# Patient Record
Sex: Male | Born: 1945 | Race: Black or African American | Hispanic: No | Marital: Married | Smoking: Former smoker
Health system: Southern US, Community
[De-identification: ages and names within clinical notes are randomized; demographics above are authoritative.]

## PROBLEM LIST (undated history)

## (undated) DIAGNOSIS — M5136 Other intervertebral disc degeneration, lumbar region: Secondary | ICD-10-CM

## (undated) DIAGNOSIS — M5030 Other cervical disc degeneration, unspecified cervical region: Secondary | ICD-10-CM

## (undated) DIAGNOSIS — I2510 Atherosclerotic heart disease of native coronary artery without angina pectoris: Secondary | ICD-10-CM

## (undated) DIAGNOSIS — Z87442 Personal history of urinary calculi: Secondary | ICD-10-CM

## (undated) DIAGNOSIS — M51369 Other intervertebral disc degeneration, lumbar region without mention of lumbar back pain or lower extremity pain: Secondary | ICD-10-CM

## (undated) DIAGNOSIS — I10 Essential (primary) hypertension: Secondary | ICD-10-CM

## (undated) DIAGNOSIS — F32A Depression, unspecified: Secondary | ICD-10-CM

## (undated) DIAGNOSIS — I639 Cerebral infarction, unspecified: Secondary | ICD-10-CM

## (undated) DIAGNOSIS — G4730 Sleep apnea, unspecified: Secondary | ICD-10-CM

## (undated) DIAGNOSIS — N529 Male erectile dysfunction, unspecified: Secondary | ICD-10-CM

## (undated) DIAGNOSIS — E785 Hyperlipidemia, unspecified: Secondary | ICD-10-CM

## (undated) DIAGNOSIS — I219 Acute myocardial infarction, unspecified: Secondary | ICD-10-CM

## (undated) DIAGNOSIS — N189 Chronic kidney disease, unspecified: Secondary | ICD-10-CM

## (undated) HISTORY — PX: OTHER SURGICAL HISTORY: SHX169

---

## 2002-08-15 ENCOUNTER — Ambulatory Visit (HOSPITAL_COMMUNITY): Admission: RE | Admit: 2002-08-15 | Discharge: 2002-08-15 | Payer: Self-pay | Admitting: Gastroenterology

## 2002-08-15 ENCOUNTER — Encounter (INDEPENDENT_AMBULATORY_CARE_PROVIDER_SITE_OTHER): Payer: Self-pay | Admitting: Specialist

## 2002-09-08 ENCOUNTER — Encounter: Admission: RE | Admit: 2002-09-08 | Discharge: 2002-09-08 | Payer: Self-pay | Admitting: Family Medicine

## 2002-09-08 ENCOUNTER — Encounter: Payer: Self-pay | Admitting: Family Medicine

## 2003-11-11 ENCOUNTER — Encounter (INDEPENDENT_AMBULATORY_CARE_PROVIDER_SITE_OTHER): Payer: Self-pay | Admitting: Specialist

## 2003-11-11 ENCOUNTER — Observation Stay (HOSPITAL_COMMUNITY): Admission: EM | Admit: 2003-11-11 | Discharge: 2003-11-12 | Payer: Self-pay | Admitting: Emergency Medicine

## 2003-11-11 ENCOUNTER — Encounter: Payer: Self-pay | Admitting: Family Medicine

## 2006-10-24 ENCOUNTER — Encounter: Admission: RE | Admit: 2006-10-24 | Discharge: 2006-10-24 | Payer: Self-pay | Admitting: Family Medicine

## 2008-03-17 ENCOUNTER — Encounter: Admission: RE | Admit: 2008-03-17 | Discharge: 2008-03-17 | Payer: Self-pay | Admitting: Family Medicine

## 2008-06-10 ENCOUNTER — Encounter: Admission: RE | Admit: 2008-06-10 | Discharge: 2008-06-10 | Payer: Self-pay | Admitting: Family Medicine

## 2011-01-13 NOTE — Op Note (Signed)
   NAME:  Daniel Cox, Daniel Cox                        ACCOUNT NO.:  [id number]   MEDICAL RECORD NO.:  [id number]                   PATIENT TYPE:  AMB   LOCATION:  ENDO                                 FACILITY:  [facility]   PHYSICIAN:  Danise Edge, M.D.                DATE OF BIRTH:  04-May-1946   DATE OF PROCEDURE:  08/15/2002  DATE OF DISCHARGE:  08/15/2002                                 OPERATIVE REPORT   PROCEDURE:  Colonoscopy and polypectomy.   INDICATIONS:  The patient is a 65 year old male born 08/06/1946.  The patient is  scheduled to undergo his first screening colonoscopy with polypectomy to  prevent colon cancer.   ENDOSCOPIST:  Danise Edge, M.D.   PREMEDICATION:  Versed 5 mg, Demerol 50 mg.   ENDOSCOPE:  Olympus pediatric colonoscope.   DESCRIPTION OF PROCEDURE:  After obtaining informed consent, the patient was  placed in the left lateral decubitus position.  I administered intravenous  Demerol and intravenous Versed to achieve conscious sedation for the  procedure.  The patient's, blood pressure, oxygen saturation, and cardiac  rhythm were monitored throughout the procedure and documented in the medical  record.   Anal inspection was normal.  Digital rectal examination was normal.  The  Olympus pediatric video colonoscope was introduced into the rectum and  advanced to the cecum.  Colonic preparation for the exam today was  excellent.   Rectum normal.   Sigmoid colon and descending colon:  At approximately 50 cm from the anal  verge a 0.5 mm sessile polyp was removed with the cold snare; pathology was  not obtained.   Splenic flexure normal.   Transverse colon:  From the proximal transverse colon a 2 mm sessile polyp  was removed with the electrocautery snare and submitted for pathologic  interpretation.   Hepatic flexure normal.   Ascending colon normal.   Cecum and ileocecal valve normal.    ASSESSMENT:  From the proximal transverse colon a 2 mm sessile  polyp was  removed and submitted for pathologic interpretation.  From the descending  colon a 0.5 mm sessile polyp was removed with the cold snare but no  pathology was obtained.   RECOMMENDATIONS:  Repeat colonoscopy in five years.                                               Danise Edge, M.D.    MJ/MEDQ  D:  08/15/2002  T:  08/17/2002  Job:  [id number]   cc:   Donia Guiles, M.D.  [address] [place]  [place] [address]  Fax: [phone]

## 2011-01-13 NOTE — H&P (Signed)
NAME:  Daniel Cox, Daniel Cox                        ACCOUNT NO.:  [id number]   MEDICAL RECORD NO.:  [id number]                   PATIENT TYPE:  OBV   LOCATION:  0098                                 FACILITY:  [facility]   PHYSICIAN:  Ollen Gross. Vernell Morgans, M.D.              DATE OF BIRTH:  16-Mar-1946   DATE OF ADMISSION:  11/11/2003  DATE OF DISCHARGE:                                HISTORY & PHYSICAL   REASON FOR ADMISSION:  Daniel Cox is a 65 year old black male who presented  to his medical doctor today with a 4-day history of right lower quadrant  pain.  He has also had some nausea associated with this but no vomiting.  He  is not running any fevers at home.  He has not had any diarrhea.  The pain  never went away and seemed to worsen today and so he went to his medical  doctor.  He otherwise denies fevers, chills, chest pain, shortness of  breath, diarrhea, dysuria.  His other review of systems is unremarkable.   PAST MEDICAL HISTORY:  None.   PAST SURGICAL HISTORY:  None.   MEDICATIONS:  None.   ALLERGIES:  None.   SOCIAL HISTORY:  He smokes about 1/2 pack of cigarettes per day and denies  any alcohol use.   FAMILY HISTORY:  Significant for breast cancer in a sister and colon cancer  in a sister.   On review of his white count from the outside facility, his white count was  10,200 which was normal.  BUN and creatinine were normal.  On review of the  CT scan from [facility] done a couple of hours ago, it is consistent  with acute appendicitis.   ASSESSMENT AND PLAN:  This is a 65 year old black male who appears to have  acute appendicitis.  I have recommended he consider having his appendix  removed tonight in the operating room and he would also like to have this  done.  I have explained to him in detail the risks and benefits of the  operation as well as some of the technical aspects and he understands and  wishes to proceed.  We will complete his preoperative workup with  some labs,  chest x-ray, and EKG and if all of these check out we will plan for surgery  this evening.                                               Ollen Gross. Vernell Morgans, M.D.    PST/MEDQ  D:  11/11/2003  T:  11/12/2003  Job:  [id number]

## 2011-01-13 NOTE — Op Note (Signed)
NAMEJADORE, Daniel Cox                          ACCOUNT NO.:  [id number]   MEDICAL RECORD NO.:  [id number]                    PATIENT TYPE:   LOCATION:                                       FACILITY:   PHYSICIAN:  Ollen Gross. Vernell Morgans, M.D.              DATE OF BIRTH:   DATE OF PROCEDURE:  11/11/2003  DATE OF DISCHARGE:                                 OPERATIVE REPORT   PREOPERATIVE DIAGNOSIS:  Appendicitis.   POSTOPERATIVE DIAGNOSIS:  Appendicitis.   PROCEDURE:  Laparoscopic appendectomy.   SURGEON:  Ollen Gross. Carolynne Edouard, M.D.   ANESTHESIA:  General endotracheal.   DESCRIPTION OF PROCEDURE:  After informed consent was obtained the patient  was brought to the operating room and placed in the supine position on the  operating table.  After adequate induction of general anesthesia the  patient's abdomen was prepped with Betadine and draped in the usual sterile  manner.  The area below the umbilicus was infiltrated with 1/4% Marcaine.  A  small incision was made with a 15-blade knife.  This incision was carried  down through the subcutaneous tissue bluntly with the Kelly clamp and  __________ needle retractors until the linea alba was identified. The linea  alba was incised with a 15-blade knife and each side was grasped with Kocher  clamps and elevated anteriorly.   The preperitoneal space was then probed bluntly with the hemostat until the  peritoneum was opened and access was gained into the abdominal cavity.  A #0  Vicryl pursestring stitch was placed in the fascia surrounding the opening.  A Hasson cannula was placed through the opening and __________ a #0 Vicryl  pursestring stitch.  The abdomen was then insufflated with carbon dioxide  without difficulty.  The patient was placed in a Trendelenburg position and  rotated with the right side up.   The right lower quadrant was inspected.  An enlarged and inflamed appendix  was identified.  At this point an area in the epigastrium was  infiltrated  with 1/4% Marcaine and a small incision was made with the 15-blade knife and  a 5-mm port was placed bluntly through this incision into the abdominal  cavity under direct vision.   A site was then also chosen in the suprapubic region for placement of a 12-  mm port.  This area was infiltrated with 1/4% Marcaine.  A small incision  was made with the 15-blade knife and the 12-mm port was placed bluntly  through this incision into the abdominal cavity under direct vision.  A  combination of blunt dissection in the right lower quadrant and sharp  dissection with the harmonic scalpel was used to mobilize the appendix from  the retroperitoneum.  Some adhesions of the right colon were taken down  sharply with laparoscopic scissors.  This allowed good visualization of the  appendix. One the appendix had been mobilized from its retroperitoneal  attachment the mesoappendix was taken down sharply with the harmonic scalpel  to the base of the appendix at its junction with the cecum.  An endoscopic  GIA-stapler was then placed across the base of the appendix, clamped and  fired; thereby, dividing the base of the appendix between staple lines.  An  endoscopic bag was placed within the abdomen and the appendix was placed  within the bag and the bag was sealed.   The appendiceal stump was then inspected and was found to be hemostatic and  the staples appeared to be intact and nicely placed.  The abdomen was then  irrigated with copious amounts of saline.  The rest of the abdomen was  inspected and no other abnormalities were identified.  The bag with the  appendix was then removed through the infraumbilical port with the Hasson  cannula without difficulty.   The fascial defect was closed with the previously placed Vicryl pursestring  stitch as well as with another interrupted #0 Vicryl stitch.  The rest of  the ports were removed under direct vision and were found to be hemostatic,  and  the gas was allowed to escape.  The anterior fascia of the suprapubic  port was closed with an interrupted #0 Vicryl stitch.  Each of the skin  incisions were closed with interrupted 4-0 Monocryl subcuticular stitches.  Benzoin and Steri-Strips were then applied.  The patient tolerated the  procedure well.  At the end of the case all needle, sponge, and instrument  counts were correct.  The patient was then awakened and taken to recovery  room in stable condition.                                               Ollen Gross. Vernell Morgans, M.D.    PST/MEDQ  D:  11/14/2003  T:  11/14/2003  Job:  [id number]

## 2013-02-04 ENCOUNTER — Emergency Department (HOSPITAL_COMMUNITY): Payer: Medicare Other

## 2013-02-04 ENCOUNTER — Inpatient Hospital Stay (HOSPITAL_COMMUNITY)
Admission: EM | Admit: 2013-02-04 | Discharge: 2013-02-11 | DRG: 248 | Disposition: A | Discharge status: Home / Self Care | Payer: Medicare Other | Attending: Interventional Cardiology | Admitting: Interventional Cardiology

## 2013-02-04 ENCOUNTER — Encounter (HOSPITAL_COMMUNITY): Payer: Self-pay | Admitting: Emergency Medicine

## 2013-02-04 DIAGNOSIS — I249 Acute ischemic heart disease, unspecified: Secondary | ICD-10-CM

## 2013-02-04 DIAGNOSIS — E7800 Pure hypercholesterolemia, unspecified: Secondary | ICD-10-CM | POA: Diagnosis present

## 2013-02-04 DIAGNOSIS — G8929 Other chronic pain: Secondary | ICD-10-CM | POA: Diagnosis present

## 2013-02-04 DIAGNOSIS — I2510 Atherosclerotic heart disease of native coronary artery without angina pectoris: Principal | ICD-10-CM

## 2013-02-04 DIAGNOSIS — M51379 Other intervertebral disc degeneration, lumbosacral region without mention of lumbar back pain or lower extremity pain: Secondary | ICD-10-CM | POA: Diagnosis present

## 2013-02-04 DIAGNOSIS — Z79899 Other long term (current) drug therapy: Secondary | ICD-10-CM

## 2013-02-04 DIAGNOSIS — N529 Male erectile dysfunction, unspecified: Secondary | ICD-10-CM | POA: Diagnosis not present

## 2013-02-04 DIAGNOSIS — IMO0002 Reserved for concepts with insufficient information to code with codable children: Secondary | ICD-10-CM | POA: Diagnosis not present

## 2013-02-04 DIAGNOSIS — I200 Unstable angina: Secondary | ICD-10-CM | POA: Diagnosis present

## 2013-02-04 DIAGNOSIS — E785 Hyperlipidemia, unspecified: Secondary | ICD-10-CM

## 2013-02-04 DIAGNOSIS — I669 Occlusion and stenosis of unspecified cerebral artery: Secondary | ICD-10-CM

## 2013-02-04 DIAGNOSIS — Y840 Cardiac catheterization as the cause of abnormal reaction of the patient, or of later complication, without mention of misadventure at the time of the procedure: Secondary | ICD-10-CM | POA: Diagnosis not present

## 2013-02-04 DIAGNOSIS — R4701 Aphasia: Secondary | ICD-10-CM | POA: Diagnosis not present

## 2013-02-04 DIAGNOSIS — H5340 Unspecified visual field defects: Secondary | ICD-10-CM | POA: Diagnosis not present

## 2013-02-04 DIAGNOSIS — G8190 Hemiplegia, unspecified affecting unspecified side: Secondary | ICD-10-CM | POA: Diagnosis not present

## 2013-02-04 DIAGNOSIS — Z8249 Family history of ischemic heart disease and other diseases of the circulatory system: Secondary | ICD-10-CM

## 2013-02-04 DIAGNOSIS — N183 Chronic kidney disease, stage 3 unspecified: Secondary | ICD-10-CM

## 2013-02-04 DIAGNOSIS — F17200 Nicotine dependence, unspecified, uncomplicated: Secondary | ICD-10-CM | POA: Diagnosis present

## 2013-02-04 DIAGNOSIS — I6340 Cerebral infarction due to embolism of unspecified cerebral artery: Secondary | ICD-10-CM | POA: Diagnosis not present

## 2013-02-04 DIAGNOSIS — N400 Enlarged prostate without lower urinary tract symptoms: Secondary | ICD-10-CM | POA: Diagnosis not present

## 2013-02-04 DIAGNOSIS — R29810 Facial weakness: Secondary | ICD-10-CM | POA: Diagnosis not present

## 2013-02-04 DIAGNOSIS — M5030 Other cervical disc degeneration, unspecified cervical region: Secondary | ICD-10-CM | POA: Diagnosis present

## 2013-02-04 DIAGNOSIS — Z955 Presence of coronary angioplasty implant and graft: Secondary | ICD-10-CM

## 2013-02-04 DIAGNOSIS — I498 Other specified cardiac arrhythmias: Secondary | ICD-10-CM | POA: Diagnosis present

## 2013-02-04 DIAGNOSIS — M5137 Other intervertebral disc degeneration, lumbosacral region: Secondary | ICD-10-CM | POA: Diagnosis present

## 2013-02-04 HISTORY — DX: Male erectile dysfunction, unspecified: N52.9

## 2013-02-04 HISTORY — DX: Other intervertebral disc degeneration, lumbar region without mention of lumbar back pain or lower extremity pain: M51.369

## 2013-02-04 HISTORY — DX: Other cervical disc degeneration, unspecified cervical region: M50.30

## 2013-02-04 HISTORY — DX: Hyperlipidemia, unspecified: E78.5

## 2013-02-04 HISTORY — DX: Chronic kidney disease, unspecified: N18.9

## 2013-02-04 HISTORY — DX: Other intervertebral disc degeneration, lumbar region: M51.36

## 2013-02-04 LAB — COMPREHENSIVE METABOLIC PANEL
ALT: 17 U/L (ref 0–53)
AST: 25 U/L (ref 0–37)
Alkaline Phosphatase: 87 U/L (ref 39–117)
CO2: 26 mEq/L (ref 19–32)
Calcium: 8.9 mg/dL (ref 8.4–10.5)
Potassium: 3.8 mEq/L (ref 3.5–5.1)
Sodium: 138 mEq/L (ref 135–145)
Total Protein: 6.7 g/dL (ref 6.0–8.3)

## 2013-02-04 LAB — CBC
HCT: 41.1 % (ref 39.0–52.0)
MCHC: 34.3 g/dL (ref 30.0–36.0)
MCV: 96 fL (ref 78.0–100.0)
RDW: 12.4 % (ref 11.5–15.5)

## 2013-02-04 LAB — BASIC METABOLIC PANEL
BUN: 17 mg/dL (ref 6–23)
Chloride: 105 mEq/L (ref 96–112)
Creatinine, Ser: 1.43 mg/dL — ABNORMAL HIGH (ref 0.50–1.35)
GFR calc Af Amer: 58 mL/min — ABNORMAL LOW (ref >90)
GFR calc non Af Amer: 50 mL/min — ABNORMAL LOW (ref >90)

## 2013-02-04 LAB — TROPONIN I: Troponin I: 0.69 ng/mL (ref <0.30)

## 2013-02-04 MED ORDER — NITROGLYCERIN IN D5W 200-5 MCG/ML-% IV SOLN
2.0000 ug/min | Freq: Once | INTRAVENOUS | Status: AC
  Administered 2013-02-04: 5 ug/min via INTRAVENOUS
  Filled 2013-02-04: qty 250

## 2013-02-04 MED ORDER — ADULT MULTIVITAMIN W/MINERALS CH
1.0000 | ORAL_TABLET | Freq: Every day | ORAL | Status: DC
  Administered 2013-02-04 – 2013-02-09 (×5): 1 via ORAL
  Filled 2013-02-04 (×7): qty 1

## 2013-02-04 MED ORDER — ASPIRIN 300 MG RE SUPP
300.0000 mg | RECTAL | Status: AC
  Filled 2013-02-04: qty 1

## 2013-02-04 MED ORDER — CYCLOBENZAPRINE HCL 10 MG PO TABS: 10.0000 mg | ORAL_TABLET | Freq: Every evening | ORAL | Status: DC | PRN

## 2013-02-04 MED ORDER — HEPARIN SODIUM (PORCINE) 5000 UNIT/ML IJ SOLN
4000.0000 [IU] | Freq: Once | INTRAMUSCULAR | Status: AC
  Administered 2013-02-04: 4000 [IU] via INTRAVENOUS

## 2013-02-04 MED ORDER — SODIUM CHLORIDE 0.9 % IV SOLN
INTRAVENOUS | Status: DC
  Administered 2013-02-04: 75 mL/h via INTRAVENOUS

## 2013-02-04 MED ORDER — NITROGLYCERIN 0.4 MG SL SUBL: 0.4000 mg | SUBLINGUAL_TABLET | SUBLINGUAL | Status: DC | PRN

## 2013-02-04 MED ORDER — ONDANSETRON HCL 4 MG/2ML IJ SOLN: 4.0000 mg | Freq: Four times a day (QID) | INTRAMUSCULAR | Status: DC | PRN

## 2013-02-04 MED ORDER — ASPIRIN EC 81 MG PO TBEC
81.0000 mg | DELAYED_RELEASE_TABLET | Freq: Every day | ORAL | Status: DC
  Administered 2013-02-05 – 2013-02-09 (×4): 81 mg via ORAL
  Filled 2013-02-04 (×5): qty 1

## 2013-02-04 MED ORDER — METHOCARBAMOL 500 MG PO TABS
750.0000 mg | ORAL_TABLET | Freq: Two times a day (BID) | ORAL | Status: DC | PRN
  Filled 2013-02-04: qty 1

## 2013-02-04 MED ORDER — ASPIRIN 81 MG PO CHEW: 324.0000 mg | CHEWABLE_TABLET | ORAL | Status: AC

## 2013-02-04 MED ORDER — HYDROCODONE-ACETAMINOPHEN 5-325 MG PO TABS: 1.0000 | ORAL_TABLET | Freq: Four times a day (QID) | ORAL | Status: DC | PRN

## 2013-02-04 MED ORDER — ATORVASTATIN CALCIUM 40 MG PO TABS
40.0000 mg | ORAL_TABLET | Freq: Every day | ORAL | Status: DC
  Administered 2013-02-05 – 2013-02-10 (×4): 40 mg via ORAL
  Filled 2013-02-04 (×7): qty 1

## 2013-02-04 MED ORDER — ACETAMINOPHEN 325 MG PO TABS: 650.0000 mg | ORAL_TABLET | ORAL | Status: DC | PRN

## 2013-02-04 MED ORDER — HEPARIN (PORCINE) IN NACL 100-0.45 UNIT/ML-% IJ SOLN
950.0000 [IU]/h | INTRAMUSCULAR | Status: DC
  Administered 2013-02-04 – 2013-02-05 (×2): 950 [IU]/h via INTRAVENOUS
  Filled 2013-02-04 (×3): qty 250

## 2013-02-04 MED ORDER — ZOLPIDEM TARTRATE 5 MG PO TABS: 5.0000 mg | ORAL_TABLET | Freq: Every evening | ORAL | Status: DC | PRN

## 2013-02-04 NOTE — ED Notes (Signed)
[place] IV placed by Dr. Silverio Lay, IV noted to have a pulsation when hooked to IV fluid. IV removed and restarted by this RN.

## 2013-02-04 NOTE — ED Notes (Signed)
Pt c/o midsternal CP x 5 days and pain in back across shoulder x 2 days; pt sent here for further eval

## 2013-02-04 NOTE — ED Provider Notes (Signed)
History     CSN: [id number]  Arrival date & time 02/04/13  1433   None     Chief Complaint  Patient presents with  . Chest Pain  . Shoulder Pain    (Consider location/radiation/quality/duration/timing/severity/associated sxs/prior treatment) Patient is a 67 y.o. male presenting with chest pain and shoulder pain.  Chest Pain Pain location:  Substernal area Pain quality: aching   Radiates to: bil shoulders. Pain radiates to the back: yes   Pain severity:  Moderate Onset quality:  Gradual Duration:  6 days Timing:  Constant Progression:  Worsening Chronicity:  New Context: not breathing and no movement   Relieved by: muscle relaxant. Worsened by:  Certain positions Ineffective treatments:  None tried Associated symptoms: cough and nausea   Associated symptoms: no abdominal pain, no back pain, no dizziness, no fever, no headache, no PND, no shortness of breath, not vomiting and no weakness   Shoulder Pain Associated symptoms include chest pain, coughing and nausea. Pertinent negatives include no abdominal pain, arthralgias, chills, congestion, fever, headaches, rash, sore throat, vomiting or weakness.    Past Medical History  Diagnosis Date  . Chronic kidney disease   . Hyperlipidemia   . Erectile dysfunction   . Degenerative disc disease, cervical   . Degenerative lumbar disc     Past Surgical History  Procedure Laterality Date  . Right shoulder    . Appendectony      History reviewed. No pertinent family history.  History  Substance Use Topics  . Smoking status: Current Every Day Smoker -- 0.50 packs/day for 30 years  . Smokeless tobacco: Not on file  . Alcohol Use: No      Review of Systems  Constitutional: Negative for fever and chills.  HENT: Negative for congestion, sore throat and rhinorrhea.   Eyes: Negative for photophobia and visual disturbance.  Respiratory: Positive for cough. Negative for shortness of breath.   Cardiovascular: Positive for  chest pain. Negative for leg swelling and PND.  Gastrointestinal: Positive for nausea. Negative for vomiting, abdominal pain, diarrhea and constipation.  Endocrine: Negative for polydipsia and polyuria.  Genitourinary: Negative for dysuria and hematuria.  Musculoskeletal: Negative for back pain and arthralgias.  Skin: Negative for color change and rash.  Neurological: Negative for dizziness, syncope, weakness, light-headedness and headaches.  Hematological: Negative for adenopathy. Does not bruise/bleed easily.  All other systems reviewed and are negative.    Allergies  Penicillins  Home Medications  No current outpatient prescriptions on file.  BP 118/65  Pulse 69  Temp(Src) 98.5 F (36.9 C) (Oral)  Resp 19  Ht 5\' 8"  (1.727 m)  Wt 169 lb 9.6 oz (76.93 kg)  BMI 25.79 kg/m2  SpO2 95%  Physical Exam  Vitals reviewed. Constitutional: He is oriented to person, place, and time. He appears well-developed and well-nourished.  HENT:  Head: Normocephalic and atraumatic.  Eyes: Conjunctivae and EOM are normal.  Neck: Normal range of motion. Neck supple.  Cardiovascular: Normal rate, regular rhythm and normal heart sounds.   Pulmonary/Chest: Effort normal and breath sounds normal. No respiratory distress.  Abdominal: He exhibits no distension. There is no tenderness. There is no rebound and no guarding.  Musculoskeletal: Normal range of motion.  Neurological: He is alert and oriented to person, place, and time.  Skin: Skin is warm and dry.    ED Course  Procedures (including critical care time)  Labs Reviewed  BASIC METABOLIC PANEL - Abnormal; Notable for the following:    Creatinine, Ser 1.43 (*)  GFR calc non Af Amer 50 (*)    GFR calc Af Amer 58 (*)    All other components within normal limits  TROPONIN I - Abnormal; Notable for the following:    Troponin I 0.69 (*)    All other components within normal limits  TROPONIN I - Abnormal; Notable for the following:     Troponin I 0.56 (*)    All other components within normal limits  TROPONIN I - Abnormal; Notable for the following:    Troponin I 0.33 (*)    All other components within normal limits  COMPREHENSIVE METABOLIC PANEL - Abnormal; Notable for the following:    Glucose, Bld 174 (*)    GFR calc non Af Amer 56 (*)    GFR calc Af Amer 64 (*)    All other components within normal limits  PRO B NATRIURETIC PEPTIDE - Abnormal; Notable for the following:    Pro B Natriuretic peptide (BNP) 344.1 (*)    All other components within normal limits  LIPID PANEL - Abnormal; Notable for the following:    HDL 35 (*)    All other components within normal limits  BASIC METABOLIC PANEL - Abnormal; Notable for the following:    Glucose, Bld 102 (*)    Creatinine, Ser 1.51 (*)    GFR calc non Af Amer 47 (*)    GFR calc Af Amer 54 (*)    All other components within normal limits  POCT I-STAT TROPONIN I - Abnormal; Notable for the following:    Troponin i, poc 0.27 (*)    All other components within normal limits  CBC  TSH  HEMOGLOBIN A1C  HEPARIN LEVEL (UNFRACTIONATED)  HEPARIN LEVEL (UNFRACTIONATED)   Dg Chest 2 View  02/04/2013   *RADIOLOGY REPORT*  Clinical Data: Chest and shoulder pain.  Smoker.  CHEST - 2 VIEW  Comparison: 10/24/2006  Findings: Borderline cardiomegaly is stable, as well as ectasia of the thoracic aorta.  Both lungs are clear.  No evidence of pleural effusion.  No mass or lymphadenopathy identified.  IMPRESSION: Stable exam.  No active disease.   Original Report Authenticated By: Myles Rosenthal, M.D.     1. Acute coronary syndrome   2. Chronic kidney disease (CKD), stage III (moderate)   3. Hyperlipidemia      Date: 02/04/2013  Rate: 62  Rhythm: normal sinus rhythm  QRS Axis: normal  Intervals: normal  ST/T Wave abnormalities: t wave inversions in lateral leads  Conduction Disutrbances:none  Narrative Interpretation:   Old EKG Reviewed: none available    MDM  67 y.o. male   with pertinent PMH of hypercholesterolemia presents with chest pain x 5 days, mix of typical and atypical features including intermittent exertional nature, nausea, radiation to bilateral shoulders, however pt also has chronic neck and back pain and pain initially began in neck and began to radiate to shoulders and arms.  Pt seen by PCP, found to have t wave inversion in anterior leads, sent here for further eval.  Physical exam benign.  Initial trop .27, cardiology consulted.    Pt admitted without change in condition or incident.   Labs and imaging as above reviewed by myself and attending,Dr. Rhunette Croft, with whom case was discussed.   1. Acute coronary syndrome   2. Chronic kidney disease (CKD), stage III (moderate)   3. Hyperlipidemia             Noel Gerold, MD 02/05/13 1443  I performed a history and  physical examination of  [name] and discussed his management with Dr. Littie Deeds. I agree with the history, physical, assessment, and plan of care, with the following exceptions: None I was present for the following procedures: None  Time Spent in Critical Care of the patient: None  Time spent in discussions with the patient and family: 5 minutes  Chest pain, with troponin elevation - Cards aware and admitting. ASA provided. Lareta Bruneau   Derwood Kaplan, MD 02/05/13 [phone]

## 2013-02-04 NOTE — H&P (Addendum)
Daniel Cox is a 67 y.o. male  Admit date: 02/04/2013 Referring Physician: Cam Hai, M.D. Primary Cardiologist:: HWB Leia Alf, M.D. Chief complaint / reason for admission: Acute coronary syndrome  HPI: 67 year old gentleman with few medical problems other than chronic renal insufficiency possibly related to acetaminophen and nonsteroidal inflammatory overuse is admitted with a 7-10 day history of recurring left precordial chest discomfort. Episodes of been occurring spontaneously. Yesterday he had a severe episode of discomfort that lasted greater than 40 minutes after he took some trash bags to the dumpster. When he awakened this morning no discomfort was present but he went to [place] at [place], which he was felt to be having an acute coronary syndrome and was sent to the emergency room. He is pain free. The initial troponin is elevated. His creatinine is also elevated at 1.5.    PMH:    Past Medical History  Diagnosis Date  . Chronic kidney disease   . Hyperlipidemia   . Erectile dysfunction   . Degenerative disc disease, cervical   . Degenerative lumbar disc     PSH:    Past Surgical History  Procedure Laterality Date  . Right shoulder    . Appendectony      ALLERGIES:   Penicillins  Prior to Admit Meds:   (Not in a hospital admission) Family HX:  Mother, father, 2 brothers, and a sister either died of MI or have diagnosis of CAD pipe 95  Social HX:    History   Social History  . Marital Status: Married    Spouse Name: N/A    Number of Children: 3  . Years of Education: N/A   Occupational History  . Not on file.   Social History Main Topics  . Smoking status: Current Every Day Smoker -- 0.50 packs/day for 30 years  . Smokeless tobacco: Not on file  . Alcohol Use: No  . Drug Use: No  . Sexually Active: Yes   Other Topics Concern  . Not on file   Social History Narrative  . No narrative on file     ROS: Chronic neck and shoulder discomfort  degenerative disc disease. 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 Hyperlipidemia chronic pain related to lumbar and cervical spine degeneration. No history of CAD. No pulmonary problems, diabetes, or tobacco use.  Physical Exam: Blood pressure 134/81, pulse 67, temperature 98.4 F (36.9 C), temperature source Oral, resp. rate 18, height 5\' 8"  (1.727 m), weight 76.658 kg (169 lb), SpO2 98.00%.    African American male who is in no distress. He is lying comfortably on the hospital gurney.  HEENT exam is unremarkable. No Johns is noted.  Neck exam reveals no JVD or carotid bruits.  Chest is clear to auscultation and percussion.  S4 gallop is heard on auscultation. No murmur or rub is heard.  Abdomen is somewhat protuberant but nontender. Bowel sounds are normal. No bruits are heard.  Extremities reveal no edema. Radial pulses are 2+ bilaterally. Femoral pulses are 2+ bilaterally.  Neurological exam is unremarkable. No motor sensory deficits and noted.  Labs:   Lab Results  Component Value Date   WBC 6.1 02/04/2013   HGB 14.1 02/04/2013   HCT 41.1 02/04/2013   MCV 96.0 02/04/2013   PLT 205 02/04/2013     Recent Labs Lab 02/04/13 1447  NA 141  K 4.1  CL 105  CO2 27  BUN 17  CREATININE  1.43*  CALCIUM 9.5  GLUCOSE 85   No results found for this basename: CKTOTAL,  CKMB,  CKMBINDEX,  TROPONINI   Troponin (Point of Care Test)  Recent Labs  02/04/13 1503  TROPIPOC 0.27*     Radiology:   *RADIOLOGY REPORT*  Clinical Data: Chest and shoulder pain. Smoker.  CHEST - 2 VIEW  Comparison: 10/24/2006  Findings: Borderline cardiomegaly is stable, as well as ectasia of  the thoracic aorta. Both lungs are clear. No evidence of pleural  effusion. No mass or lymphadenopathy identified.  IMPRESSION:  Stable exam. No active disease.  Original Report Authenticated By: Myles Rosenthal, M.D.   EKG:   Normal sinus rhythm with  precordial T-wave inversion V2 through V4   ASSESSMENT:   1. Acute coronary syndrome  2. Chronic kidney disease, stage III  3. Hyperlipidemia  Plan:  1. IV hydration  2. Serial cardiac markers  3. Probable coronary angiography in the next 24-48 hours depending upon renal function and schedule.  4. Aspirin, IV nitroglycerin, IV heparin, beta blocker therapy. Lesleigh Noe 02/04/2013 5:49 PM

## 2013-02-04 NOTE — Progress Notes (Signed)
ANTICOAGULATION CONSULT NOTE - Initial Consult  Pharmacy Consult for Heparin Indication: chest pain/ACS  Allergies  Allergen Reactions  . Penicillins Hives, Itching and Rash    Patient Measurements: Height: 5\' 8"  (172.7 cm) Weight: 169 lb (76.658 kg) IBW/kg (Calculated) : 68.4 Heparin Dosing Weight: 76.7 kg  Vital Signs: Temp: 98.4 F (36.9 C) (06/10 1437) Temp src: Oral (06/10 1437) BP: 143/85 mmHg (06/10 1521) Pulse Rate: 54 (06/10 1521)  Labs:  Recent Labs  02/04/13 1447  HGB 14.1  HCT 41.1  PLT 205  CREATININE 1.43*    Estimated Creatinine Clearance: 49.8 ml/min (by C-G formula based on Cr of 1.43).   Medical History:  Hypercholesterolemia  Chronic neck and back pain  Assessment:  67 yr old man, seen by PCP today, sent to [facility] for evaluation of chest pain x 5 days, some radiation to shoulders noted.    Goal of Therapy:  Heparin level 0.3-0.7 units/ml Monitor platelets by anticoagulation protocol: Yes   Plan:   Heparin 4000 units IV bolus,  Heparin drip to begin at 950 units/hr.  Heparin level ~ 6 hrs after drip begins.  Daily heparin level and CBC while on heparin.  Dennie Fetters, RPh Pager: [phone] 02/04/2013,3:57 PM

## 2013-02-04 NOTE — Progress Notes (Signed)
CRITICAL VALUE ALERT  Critical value received:  Troponin 0.69  Date of notification: 02/04/13  Time of notification:  21:00  Critical value read back: yes   Nurse who received alert: Otilio Connors  MD notified (1st page):  Dr. Armanda Magic- on call for Dr. Katrinka Blazing   Time of first page:  21:00 MD notified (2nd page):  Time of second page: Time MD responded:  21:05  Responding MD:  Dr. Armanda Magic   No new orders received-advised her pt is pain free, vitals are stable, and he is already on a Heparin and Ntg gtt.

## 2013-02-05 LAB — LIPID PANEL
Cholesterol: 119 mg/dL (ref 0–200)
HDL: 35 mg/dL — ABNORMAL LOW
LDL Cholesterol: 57 mg/dL (ref 0–99)
Total CHOL/HDL Ratio: 3.4 ratio
Triglycerides: 133 mg/dL
VLDL: 27 mg/dL (ref 0–40)

## 2013-02-05 LAB — HEMOGLOBIN A1C: Mean Plasma Glucose: 114 mg/dL (ref <117)

## 2013-02-05 LAB — BASIC METABOLIC PANEL WITH GFR
BUN: 21 mg/dL (ref 6–23)
CO2: 28 meq/L (ref 19–32)
Calcium: 8.8 mg/dL (ref 8.4–10.5)
Chloride: 104 meq/L (ref 96–112)
Creatinine, Ser: 1.51 mg/dL — ABNORMAL HIGH (ref 0.50–1.35)
GFR calc Af Amer: 54 mL/min — ABNORMAL LOW
GFR calc non Af Amer: 47 mL/min — ABNORMAL LOW
Glucose, Bld: 102 mg/dL — ABNORMAL HIGH (ref 70–99)
Potassium: 3.7 meq/L (ref 3.5–5.1)
Sodium: 140 meq/L (ref 135–145)

## 2013-02-05 LAB — TROPONIN I
Troponin I: 0.56 ng/mL
Troponin I: 0.33 ng/mL

## 2013-02-05 LAB — HEPARIN LEVEL (UNFRACTIONATED): Heparin Unfractionated: 0.44 IU/mL (ref 0.30–0.70)

## 2013-02-05 LAB — TSH: TSH: 0.924 u[IU]/mL (ref 0.350–4.500)

## 2013-02-05 MED ORDER — SODIUM CHLORIDE 0.9 % IV SOLN
INTRAVENOUS | Status: DC
  Administered 2013-02-05: 10:00:00 via INTRAVENOUS

## 2013-02-05 MED ORDER — SODIUM CHLORIDE 0.9 % IV SOLN
INTRAVENOUS | Status: DC
  Administered 2013-02-05 – 2013-02-06 (×3): via INTRAVENOUS

## 2013-02-05 MED ORDER — DIAZEPAM 5 MG PO TABS
5.0000 mg | ORAL_TABLET | ORAL | Status: AC
  Administered 2013-02-06: 5 mg via ORAL
  Filled 2013-02-05: qty 1

## 2013-02-05 MED ORDER — SODIUM CHLORIDE 0.9 % IV SOLN: 250.0000 mL | INTRAVENOUS | Status: DC | PRN

## 2013-02-05 MED ORDER — SODIUM CHLORIDE 0.9 % IJ SOLN: 3.0000 mL | INTRAMUSCULAR | Status: DC | PRN

## 2013-02-05 MED ORDER — ASPIRIN 81 MG PO CHEW
324.0000 mg | CHEWABLE_TABLET | ORAL | Status: AC
  Administered 2013-02-06: 324 mg via ORAL
  Filled 2013-02-05: qty 4

## 2013-02-05 MED ORDER — SODIUM CHLORIDE 0.9 % IJ SOLN
3.0000 mL | Freq: Two times a day (BID) | INTRAMUSCULAR | Status: DC
  Administered 2013-02-05: 3 mL via INTRAVENOUS

## 2013-02-05 NOTE — Progress Notes (Signed)
ANTICOAGULATION CONSULT NOTE - Follow Up Consult  Pharmacy Consult for heparin Indication: chest pain/ACS  Labs:  Recent Labs  02/04/13 1447 02/04/13 1955 02/04/13 2016 02/05/13 0025 02/05/13 0030  HGB 14.1  --   --   --   --   HCT 41.1  --   --   --   --   PLT 205  --   --   --   --   HEPARINUNFRC  --   --   --  0.44  --   CREATININE 1.43* 1.31  --   --  1.51*  TROPONINI  --   --  0.69*  --  0.56*    Assessment/Plan:  67yo male therapeutic on heparin with initial dosing for CP.  Will continue gtt at current rate and confirm stable with additional level.  Daniel Cox, PharmD, BCPS  02/05/2013,4:19 AM

## 2013-02-05 NOTE — Progress Notes (Signed)
Patient Name: Daniel Cox Date of Encounter: 02/05/2013    SUBJECTIVE: No recurrence of symptoms overnight. He denies dyspnea. EKG this morning reveals marked anterior T wave inversion. Creatinine this morning is higher than yesterday, for reasons that are not clear to me.  TELEMETRY:  Sinus bradycardia Filed Vitals:   02/04/13 1830 02/04/13 1905 02/04/13 1937 02/05/13 0357  BP: 105/63 107/69  96/66  Pulse: 77 72  58  Temp:   98.6 F (37 C) 98.3 F (36.8 C)  TempSrc:    Oral  Resp: 25 20  20   Height:  5\' 8"  (1.727 m)    Weight:  76.93 kg (169 lb 9.6 oz)    SpO2: 98% 98%  99%   No intake or output data in the 24 hours ending 02/05/13 0944  LABS: Basic Metabolic Panel:  Recent Labs  16/10/96 1955 02/05/13 0030  NA 138 140  K 3.8 3.7  CL 103 104  CO2 26 28  GLUCOSE 174* 102*  BUN 19 21  CREATININE 1.31 1.51*  CALCIUM 8.9 8.8   CBC:  Recent Labs  02/04/13 1447  WBC 6.1  HGB 14.1  HCT 41.1  MCV 96.0  PLT 205   Cardiac Enzymes:  Recent Labs  02/04/13 2016 02/05/13 0030 02/05/13 0730  TROPONINI 0.69* 0.56* 0.33*   BNP: No components found with this basename: POCBNP,  Hemoglobin A1C:  Recent Labs  02/04/13 1955  HGBA1C 5.6   Fasting Lipid Panel:  Recent Labs  02/05/13 0030  CHOL 119  HDL 35*  LDLCALC 57  TRIG 045  CHOLHDL 3.4    Radiology/Studies:  No acute abnormality  Physical Exam: Blood pressure 96/66, pulse 58, temperature 98.3 F (36.8 C), temperature source Oral, resp. rate 20, height 5\' 8"  (1.727 m), weight 76.93 kg (169 lb 9.6 oz), SpO2 99.00%. Weight change:    S4 gallop  Symmetrical pulses.  Chest is clear  ASSESSMENT:  1. Acute coronary syndrome  2. Stage III chronic kidney disease, creatinine increased this morning for unknown reasons.   Plan:  1. IV hydration  2. Coronary angiography and possible PCI in a.m.  Selinda Eon 02/05/2013, 9:44 AM

## 2013-02-05 NOTE — Care Management Note (Unsigned)
    Page 1 of 1   02/07/2013     3:35:22 PM   CARE MANAGEMENT NOTE 02/07/2013  Patient:  Daniel Cox, Daniel Cox   Account Number:  [id number]  Date Initiated:  02/05/2013  Documentation initiated by:  Ellar Hakala  Subjective/Objective Assessment:   PT ADM ON 02/04/13 WITH ACUTE CORONARY SYNDROME, STAGE III KIDNEY DISEASE.  PTA, PT RESIDES AT HOME WITH SPOUSE.     Action/Plan:   WILL FOLLOW FOR HOME NEEDS AS PT PROGRESSES.   Anticipated DC Date:  02/07/2013   Anticipated DC Plan:  HOME W HOME HEALTH SERVICES      DC Planning Services  CM consult      Choice offered to / List presented to:             Status of service:  In process, will continue to follow Medicare Important Message given?   (If response is "NO", the following Medicare IM given date fields will be blank) Date Medicare IM given:   Date Additional Medicare IM given:    Discharge Disposition:    Per UR Regulation:  Reviewed for med. necessity/level of care/duration of stay  If discussed at Long Length of Stay Meetings, dates discussed:    Comments:  02/07/13 Mackie Holness,RN,BSN [phone] PT S/P STROKE DURING CATH ON 02/06/13.  NOTED LIKELY PCI ON MONDAY, 6/16.  WOULD RECOMMEND PT/OT CONSULTS TO ASSESS HOME NEEDS WHEN ABLE TO TOLERATE THERAPIES.  THANKS.

## 2013-02-05 NOTE — Progress Notes (Signed)
ANTICOAGULATION CONSULT NOTE - Follow Up Consult  Pharmacy Consult for Heparin Indication: chest pain/ACS  Allergies  Allergen Reactions  . Penicillins Hives, Itching and Rash    Patient Measurements: Height: 5\' 8"  (172.7 cm) Weight: 169 lb 9.6 oz (76.93 kg) IBW/kg (Calculated) : 68.4 Heparin Dosing Weight: 76.7 kg  Vital Signs: Temp: 98.3 F (36.8 C) (06/11 0357) Temp src: Oral (06/11 0357) BP: 96/66 mmHg (06/11 0357) Pulse Rate: 58 (06/11 0357)  Labs:  Recent Labs  02/04/13 1447 02/04/13 1955 02/04/13 2016 02/05/13 0025 02/05/13 0030 02/05/13 0730 02/05/13 1025  HGB 14.1  --   --   --   --   --   --   HCT 41.1  --   --   --   --   --   --   PLT 205  --   --   --   --   --   --   HEPARINUNFRC  --   --   --  0.44  --   --  0.49  CREATININE 1.43* 1.31  --   --  1.51*  --   --   TROPONINI  --   --  0.69*  --  0.56* 0.33*  --     Estimated Creatinine Clearance: 47.2 ml/min (by C-G formula based on Cr of 1.51).   Medications:  Scheduled:  . aspirin  324 mg Oral NOW   Or  . aspirin  300 mg Rectal NOW  . [START ON 02/06/2013] aspirin  324 mg Oral Pre-Cath  . aspirin EC  81 mg Oral Daily  . atorvastatin  40 mg Oral q1800  . [START ON 02/06/2013] diazepam  5 mg Oral On Call  . multivitamin with minerals  1 tablet Oral Daily  . sodium chloride  3 mL Intravenous Q12H   Infusions:  . sodium chloride 75 mL/hr (02/04/13 2003)  . sodium chloride    . sodium chloride 125 mL/hr at 02/05/13 1007  . heparin 950 Units/hr (02/04/13 1632)    Assessment: 67 yo M admitted with CP/ACS and elevated troponin.  Pt is on IV heparin at 950 units/hr and level is therapeutic.  Noted plans for cath 6/12 pending improvement in renal function.  Goal of Therapy:  Heparin level 0.3-0.7 units/ml Monitor platelets by anticoagulation protocol: Yes   Plan:  Continue heparin at 950 units/hr. Follow up with heparin level and CBC in AM.  Dixie Dials, Pharm.D., BCPS Clinical  Pharmacist Pager [phone] 02/05/2013 11:51 AM

## 2013-02-06 ENCOUNTER — Inpatient Hospital Stay (HOSPITAL_COMMUNITY): Payer: Medicare Other

## 2013-02-06 ENCOUNTER — Other Ambulatory Visit: Payer: Self-pay | Admitting: *Deleted

## 2013-02-06 ENCOUNTER — Encounter (HOSPITAL_COMMUNITY)
Admission: EM | Disposition: A | Discharge status: Home / Self Care | Payer: Self-pay | Source: Home / Self Care | Attending: Interventional Cardiology

## 2013-02-06 DIAGNOSIS — N400 Enlarged prostate without lower urinary tract symptoms: Secondary | ICD-10-CM

## 2013-02-06 DIAGNOSIS — I2510 Atherosclerotic heart disease of native coronary artery without angina pectoris: Secondary | ICD-10-CM

## 2013-02-06 DIAGNOSIS — I200 Unstable angina: Secondary | ICD-10-CM

## 2013-02-06 DIAGNOSIS — N183 Chronic kidney disease, stage 3 (moderate): Secondary | ICD-10-CM

## 2013-02-06 DIAGNOSIS — E785 Hyperlipidemia, unspecified: Secondary | ICD-10-CM

## 2013-02-06 DIAGNOSIS — I669 Occlusion and stenosis of unspecified cerebral artery: Secondary | ICD-10-CM

## 2013-02-06 HISTORY — PX: LEFT HEART CATHETERIZATION WITH CORONARY ANGIOGRAM: SHX5451

## 2013-02-06 LAB — CBC WITH DIFFERENTIAL/PLATELET
Basophils Absolute: 0 10*3/uL (ref 0.0–0.1)
Eosinophils Absolute: 0.2 10*3/uL (ref 0.0–0.7)
Eosinophils Relative: 5 % (ref 0–5)
MCH: 33 pg (ref 26.0–34.0)
MCV: 94.6 fL (ref 78.0–100.0)
Neutrophils Relative %: 33 % — ABNORMAL LOW (ref 43–77)
Platelets: 151 10*3/uL (ref 150–400)
RBC: 3.73 MIL/uL — ABNORMAL LOW (ref 4.22–5.81)
RDW: 12 % (ref 11.5–15.5)
WBC: 4.7 10*3/uL (ref 4.0–10.5)

## 2013-02-06 LAB — COMPREHENSIVE METABOLIC PANEL
Albumin: 3.2 g/dL — ABNORMAL LOW (ref 3.5–5.2)
BUN: 13 mg/dL (ref 6–23)
Calcium: 8.3 mg/dL — ABNORMAL LOW (ref 8.4–10.5)
Creatinine, Ser: 1.24 mg/dL (ref 0.50–1.35)
GFR calc Af Amer: 69 mL/min — ABNORMAL LOW (ref >90)
Glucose, Bld: 84 mg/dL (ref 70–99)
Total Protein: 6.3 g/dL (ref 6.0–8.3)

## 2013-02-06 LAB — BASIC METABOLIC PANEL
BUN: 15 mg/dL (ref 6–23)
Calcium: 8.6 mg/dL (ref 8.4–10.5)
GFR calc non Af Amer: 59 mL/min — ABNORMAL LOW (ref >90)
Glucose, Bld: 99 mg/dL (ref 70–99)
Sodium: 137 mEq/L (ref 135–145)

## 2013-02-06 LAB — CBC
MCH: 32.7 pg (ref 26.0–34.0)
MCHC: 34.3 g/dL (ref 30.0–36.0)
Platelets: 176 10*3/uL (ref 150–400)

## 2013-02-06 LAB — CK TOTAL AND CKMB (NOT AT ARMC)
CK, MB: 1.7 ng/mL (ref 0.3–4.0)
Relative Index: 1.4 (ref 0.0–2.5)
Total CK: 120 U/L (ref 7–232)

## 2013-02-06 LAB — PROTIME-INR
Prothrombin Time: 13.1 seconds (ref 11.6–15.2)
Prothrombin Time: 13.5 seconds (ref 11.6–15.2)

## 2013-02-06 LAB — HEPARIN LEVEL (UNFRACTIONATED): Heparin Unfractionated: 0.13 IU/mL — ABNORMAL LOW (ref 0.30–0.70)

## 2013-02-06 LAB — POCT ACTIVATED CLOTTING TIME: Activated Clotting Time: 154 seconds

## 2013-02-06 SURGERY — LEFT HEART CATHETERIZATION WITH CORONARY ANGIOGRAM
Anesthesia: LOCAL

## 2013-02-06 MED ORDER — NITROGLYCERIN 0.2 MG/ML ON CALL CATH LAB
INTRAVENOUS | Status: AC
  Filled 2013-02-06: qty 1

## 2013-02-06 MED ORDER — HEPARIN (PORCINE) IN NACL 100-0.45 UNIT/ML-% IJ SOLN
950.0000 [IU]/h | INTRAMUSCULAR | Status: DC
  Administered 2013-02-06: 900 [IU]/h via INTRAVENOUS
  Filled 2013-02-06 (×3): qty 250

## 2013-02-06 MED ORDER — ALTEPLASE (STROKE) FULL DOSE INFUSION
0.9000 mg/kg | Freq: Once | INTRAVENOUS | Status: DC
  Filled 2013-02-06: qty 71

## 2013-02-06 MED ORDER — SODIUM CHLORIDE 0.9 % IV SOLN: 250.0000 mL | Freq: Once | INTRAVENOUS | Status: DC

## 2013-02-06 MED ORDER — ALBUTEROL SULFATE (5 MG/ML) 0.5% IN NEBU
2.5000 mg | INHALATION_SOLUTION | Freq: Once | RESPIRATORY_TRACT | Status: AC
  Administered 2013-02-06: 2.5 mg via RESPIRATORY_TRACT

## 2013-02-06 MED ORDER — ALTEPLASE (ACTIVASE) FOR STROKE - USE ORDER SET
69.0000 mg | Freq: Once | Status: DC
Start: 2013-02-06 — End: 2013-02-06

## 2013-02-06 MED ORDER — ACETAMINOPHEN 325 MG PO TABS: 650.0000 mg | ORAL_TABLET | ORAL | Status: DC | PRN

## 2013-02-06 MED ORDER — FENTANYL CITRATE 0.05 MG/ML IJ SOLN
INTRAMUSCULAR | Status: AC
  Filled 2013-02-06: qty 2

## 2013-02-06 MED ORDER — MIDAZOLAM HCL 2 MG/2ML IJ SOLN
INTRAMUSCULAR | Status: AC
  Filled 2013-02-06: qty 2

## 2013-02-06 MED ORDER — NITROGLYCERIN IN D5W 200-5 MCG/ML-% IV SOLN
10.0000 ug/min | INTRAVENOUS | Status: DC
  Administered 2013-02-06: 10 ug/min via INTRAVENOUS
  Filled 2013-02-06: qty 250

## 2013-02-06 MED ORDER — LIDOCAINE HCL (PF) 1 % IJ SOLN
INTRAMUSCULAR | Status: AC
  Filled 2013-02-06: qty 30

## 2013-02-06 MED ORDER — HEPARIN (PORCINE) IN NACL 2-0.9 UNIT/ML-% IJ SOLN
INTRAMUSCULAR | Status: AC
  Filled 2013-02-06: qty 1000

## 2013-02-06 MED ORDER — SODIUM CHLORIDE 0.9 % IV SOLN
1.0000 mL/kg/h | INTRAVENOUS | Status: AC
  Administered 2013-02-06: 1 mL/kg/h via INTRAVENOUS

## 2013-02-06 MED ORDER — VERAPAMIL HCL 2.5 MG/ML IV SOLN
INTRAVENOUS | Status: AC
  Filled 2013-02-06: qty 2

## 2013-02-06 MED ORDER — OXYCODONE-ACETAMINOPHEN 5-325 MG PO TABS
1.0000 | ORAL_TABLET | ORAL | Status: DC | PRN
  Administered 2013-02-07: 1 via ORAL
  Filled 2013-02-06: qty 1

## 2013-02-06 NOTE — H&P (Signed)
The patient presented with acute coronary syndrome. He had minimal elevations in troponin x3. EKG revealed anterior T wave inversions. Pain was occurring with minimal activity. Upon presentation his creatinine was elevated above 1.5. He is a history of stage III chronic kidney disease. We decided to delay angiography given his stable clinical condition on presentation for overnight hydration. Creatinine this morning is 1.25. He is been comfortable overnight without chest discomfort or other problems.  The plan is for coronary angiography and possible PCI via the right radial approach. The procedure and its risks have been discussed with the patient including death, stroke, myocardial infarction, limb ischemia, emergency surgery, bleeding, allergy, kidney failure, among other potential complications. He understands he is in his had his questions answered to his satisfaction. He has given consent to proceed.

## 2013-02-06 NOTE — Progress Notes (Signed)
The patient has dramatic recovery of neuro function since he left the cath lab to have MRI/A done under the supervision of Neurology, Dr. Roseanne Reno.  He is now basically back to normal function but I still recognize droop of the right corner of his mouth.   We discussed the findings of cath and my opinion that we obtain consult from TCTS to consider CABG. Culprit PCI of the LAD is also an option but would lead to incomplete revascularization and high risk of acute diagonal occlusion given the Medina 1,0,1 bifurcation anatomy and diffuse diagonal disease.  Resume heparin when safe form groin standpoint.

## 2013-02-06 NOTE — CV Procedure (Signed)
Diagnostic Cardiac Catheterization Report  Daniel Cox  67 y.o.  male [birth year]  Procedure Date: 02/06/2013 Referring Physician: Cam Hai, M.D. Primary Cardiologist:: HWB Katrinka Blazing, M.D.   PROCEDURE:  Left heart catheterization with selective coronary angiography, left ventriculogram.  INDICATIONS:  Acute coronary syndrome  The risks, benefits, and details of the procedure were explained to the patient.  The patient verbalized understanding and wanted to proceed.  Informed written consent was obtained.  PROCEDURE TECHNIQUE:  After Xylocaine anesthesia a 5 French sheath was placed in the right femoral artery with a single anterior needle wall stick. We attempted right radial angiography but were unsuccessful due to intense radial artery spasm after cannulating and attempting to pass the wire.  We then noticed that the aorta was tortuous and dilated as we rounded the aortic arch to perform coronary angiography. Multiple catheter exchanges were necessary because of aortic tortuosity. Coronary angiography was done using a 5 French MP A2, JR 4, and JL 5 catheter.  Left ventriculography was done using a 5 [place] JR 4 catheter. Hand injection was used.   Because of the aortic tortuosity and dilatation, we needed to use the guidewire to cross the aortic valve with a Judkins right catheter. We were unable to obtain any coronary shots with multipurpose catheter. During left coronary angiography was began noticing that the patient tended to thrash around on the table and was unable to follow commands. Neurological assessment clearly demonstrated evidence of left brain injury with a left gaze preference and speech difficulty. We finish taking the pictures of the left coronary and initiated CODE STROKE protocol .   CONTRAST:  Total of 100 cc.  COMPLICATIONS: Embolic stroke involving left brain. The case was terminated and code stroke protocol initiated. Midway through the procedure after 3  catheter exchanges the patient began to move around on the table and became unable to follow commands. Assessment demonstrated speech impairment, left gaze preference, and inability to follow commands.  HEMODYNAMICS:  Aortic pressure was 114/72 mmHg; LV pressure was 124/21; LVEDP 21 mm mercury.  There was no gradient between the left ventricle and aorta.    ANGIOGRAPHIC DATA:  The aorta appeared to be tortuous and dilated and was demonstrated by cinefluoroscopy before attempting coronary angiography.   The left main coronary artery is large and widely patent.  The left anterior descending artery is moderate to heavily calcified proximally. In the proximal to mid vessel distal to the first septal perforator there is a segmental stenosis with up to 99% obstruction with TIMI grade 2 flow. A small to moderate diagonal branch arises from the distal portion of this segmental LAD stenosis. The diagonal is diffusely diseased throughout the proximal third of the vessel with up to 90% stenosis.. This bifurcation lesion is a Medina 1, 0, 1.  The left circumflex artery is patent. He gives origin to 3 obtuse marginal branches. The first 2 marginals are large. The first obtuse marginal contains proximal segmental 50% stenosis. The second obtuse marginal which is the largest of the 3 contains ostial 80% stenosis. The proximal portion of the third obtuse marginal is 99% obstructed..  The right coronary artery is tortuous with a shepherd's screw origin. Moderate least severe disease is noted throughout the mid vessel with 3 lesions, eccentric in nature in the 50-70% range. The distal vessel contains concentric 50% stenosis.  LEFT VENTRICULOGRAM:  Left ventricular angiogram was done in the 30 RAO projection and revealed mild apical wall motion abnormality and preserved  systolic function with an estimated ejection fraction of 60 %.   IMPRESSIONS:  1. Acute left brain stroke complicating a complicated angiographic  procedure  2. 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  3. Overall normal left ventricular function   RECOMMENDATION:  Surgical revascularization versus culprit PCI for management of coronary disease.  The major and most pressing issue at this time is the patient's neurological status. Code stroke is been initiated and the neurologist is here assessing the patient.Marland Kitchen

## 2013-02-06 NOTE — Progress Notes (Signed)
ANTICOAGULATION CONSULT NOTE - Follow Up Consult  Pharmacy Consult for Heparin Indication: chest pain/ACS  Allergies  Allergen Reactions  . Penicillins Hives, Itching and Rash    Patient Measurements: Height: 5\' 8"  (172.7 cm) Weight: 174 lb 6.1 oz (79.1 kg) IBW/kg (Calculated) : 68.4 Heparin Dosing Weight: 76.7 kg  Vital Signs: Temp: 98.3 F (36.8 C) (06/12 1100) Temp src: Oral (06/12 1100) BP: 165/92 mmHg (06/12 1100) Pulse Rate: 56 (06/12 1100)  Labs:  Recent Labs  02/04/13 1447  02/04/13 2016  02/05/13 0030 02/05/13 0730 02/05/13 1025 02/06/13 0435 02/06/13 0730 02/06/13 1010 02/06/13 1017  HGB 14.1  --   --   --   --   --   --  11.9*  --  12.3*  --   HCT 41.1  --   --   --   --   --   --  34.7*  --  35.3*  --   PLT 205  --   --   --   --   --   --  176  --  151  --   APTT  --   --   --   --   --   --   --   --   --  38*  --   LABPROT  --   --   --   --   --   --   --   --  13.1 13.5  --   INR  --   --   --   --   --   --   --   --  1.00 1.04  --   HEPARINUNFRC  --   --   --   < >  --   --  0.49 0.47  --  0.13*  --   CREATININE 1.43*  < >  --   --  1.51*  --   --  1.25  --  1.24  --   CKTOTAL  --   --   --   --   --   --   --   --   --   --  120  CKMB  --   --   --   --   --   --   --   --   --   --  1.7  TROPONINI  --   --  0.69*  --  0.56* 0.33*  --   --   --   --   --   < > = values in this interval not displayed.  Estimated Creatinine Clearance: 57.5 ml/min (by C-G formula based on Cr of 1.24).   Medications:  Scheduled:  . aspirin EC  81 mg Oral Daily  . atorvastatin  40 mg Oral q1800  . multivitamin with minerals  1 tablet Oral Daily   Infusions:  . sodium chloride    . nitroGLYCERIN      Assessment: 67 yo M admitted with CP/ACS and elevated troponin.  Pt is now s/p cath which revealed multivessel disease and TCTS consult is pending.  Asked to restart heparin 8 hours after sheath pull (12 noon).  Noted cardiac cath was complicated by acute  stroke with R hemiplegia and aphasia.  tPA was ordered but not given as patient had rspidly resolving symptoms.  In light of this, patient will have a reduced heparin level goal.  Previously pt was on heparin at 950 units/hr with corresponding heparin  level of 0.47.  Goal of Therapy:  Heparin level 0.3-0.5 units/ml Monitor platelets by anticoagulation protocol: Yes   Plan:  At 8pm tonight, begin heparin 900 units/hr.  Rate was slightly reduced to reflect lower goal with recent CVA. Heparin level 8 hours after infusion started (0400 6/13) Heparin level and CBC daily while on heparin.  [employer], Pharm.D., BCPS Clinical Pharmacist Pager [phone] 02/06/2013 12:53 PM

## 2013-02-06 NOTE — Code Documentation (Signed)
Code stroke called at 0943, LSN [phone], Stroke team arrived  At [phone], lab arrived at 1015, pharmacy notified to mix TPA at 1010, Tpa delivered to bedside at 1031.  NIHSS 5 initially then cleared to 0.  Patient was in cath lab having a procedure done when he developed right side weakness and aphasia.  After CT patient started to improve.  Patietn went for MRI at 1145.

## 2013-02-06 NOTE — Interval H&P Note (Signed)
Cath Lab Visit (complete for each Cath Lab visit)  Clinical Evaluation Leading to the Procedure:   ACS: yes  Non-ACS:    Anginal Classification: CCS IV  Anti-ischemic medical therapy: Minimal Therapy (1 class of medications)  Non-Invasive Test Results: No non-invasive testing performed  Prior CABG: No previous CABG      History and Physical Interval Note:  02/06/2013 8:35 AM  Daniel Cox  has presented today for surgery, with the diagnosis of cp  The various methods of treatment have been discussed with the patient and family. After consideration of risks, benefits and other options for treatment, the patient has consented to  Procedure(s): LEFT HEART CATHETERIZATION WITH CORONARY ANGIOGRAM (N/A) as a surgical intervention .  The patient's history has been reviewed, patient examined, no change in status, stable for surgery.  I have reviewed the patient's chart and labs.  Questions were answered to the patient's satisfaction.     Daniel Cox

## 2013-02-06 NOTE — Consult Note (Signed)
[address]       [place] [address]             [phone]          CARDIOTHORACIC SURGERY CONSULTATION REPORT  PCP is Cam Hai, MD Referring Provider is Lesleigh Noe, MD   Reason for consultation:  Coronary artery disease  HPI:  Patient is a 67 year old African American male with history of chronic renal insufficiency but no previous history of coronary artery disease. The patient was admitted to the hospital 2 days ago with a one-week history of stuttering left-sided chest pain consistent with unstable angina. On the day prior to admission the patient had a prolonged episode of chest discomfort lasting 40 minutes after he had taken the trash out. The patient initially went to his primary care physician's office on 02/04/2013, but he was sent directly to the emergency room for evaluation of unstable angina. The patient has remained pain-free since admission but troponin levels were elevated.  He underwent cardiac catheterization earlier today by Dr. Katrinka Blazing. He was found to have high-grade stenosis of the mid left anterior descending coronary artery with moderate three-vessel coronary artery disease. At the termination of the procedure the patient suffered an acute stroke.  The patient was noted to have speech impairment with left gaze per preference and inability to follow commands. Code stroke was called and the patient was treated with TPA. He was seen by Neurology and noted to have right hemiplegia, expressive and receptive aphasia and a right facial droop.  He underwent brain CT and MRI/MRA.  He was found to have a small left posterior insula/external capsule acute infarct with possible multiple other punctate acute infarcts in the left MCA territory, and M. RA revealed focal segment of nonvisualized flow in the left posterior MCA distribution. The patient's symptoms have largely resolved although he has a persistent right-sided facial droop and he  remains mildly confused. He has not had any chest pain.  Past Medical History  Diagnosis Date  . Chronic kidney disease   . Hyperlipidemia   . Erectile dysfunction   . Degenerative disc disease, cervical   . Degenerative lumbar disc     Past Surgical History  Procedure Laterality Date  . Right shoulder    . Appendectony      History reviewed. No pertinent family history.  History   Social History  . Marital Status: Married    Spouse Name: N/A    Number of Children: 3  . Years of Education: N/A   Occupational History  . Not on file.   Social History Main Topics  . Smoking status: Current Every Day Smoker -- 0.50 packs/day for 30 years  . Smokeless tobacco: Not on file  . Alcohol Use: No  . Drug Use: No  . Sexually Active: Yes   Other Topics Concern  . Not on file   Social History Narrative  . No narrative on file    Prior to Admission medications   Medication Sig Start Date End Date Taking? Authorizing Provider  cyclobenzaprine (FLEXERIL) 10 MG tablet Take 10 mg by mouth at bedtime as needed for muscle spasms.   Yes Historical Provider, MD  HYDROcodone-acetaminophen (NORCO/VICODIN) 5-325 MG per tablet Take 1 tablet by mouth every 6 (six) hours as needed for pain.   Yes Historical Provider, MD  methocarbamol (ROBAXIN) 750 MG tablet Take 750 mg by mouth 2 (two) times daily as needed (for shoulder, neck,  arm pain).   Yes Historical Provider, MD  Multiple Vitamin (MULTIVITAMIN WITH MINERALS) TABS Take 1 tablet by mouth daily.   Yes Historical Provider, MD  rosuvastatin (CRESTOR) 40 MG tablet Take 20 mg by mouth at bedtime.   Yes Historical Provider, MD    Current Facility-Administered Medications  Medication Dose Route Frequency Provider Last Rate Last Dose  . 0.9 %  sodium chloride infusion  1 mL/kg/hr Intravenous Continuous Lyn Records III, MD 79.1 mL/hr at 02/06/13 1230 1 mL/kg/hr at 02/06/13 1230  . acetaminophen (TYLENOL) tablet 650 mg  650 mg Oral Q4H PRN  Lyn Records III, MD      . aspirin EC tablet 81 mg  81 mg Oral Daily Lyn Records III, MD   81 mg at 02/05/13 1007  . atorvastatin (LIPITOR) tablet 40 mg  40 mg Oral q1800 Lyn Records III, MD   40 mg at 02/05/13 1716  . cyclobenzaprine (FLEXERIL) tablet 10 mg  10 mg Oral QHS PRN Lyn Records III, MD      . heparin ADULT infusion 100 units/mL (25000 units/250 mL)  900 Units/hr Intravenous Continuous Judie Bonus Hammons, RPH      . HYDROcodone-acetaminophen (NORCO/VICODIN) 5-325 MG per tablet 1 tablet  1 tablet Oral Q6H PRN Lyn Records III, MD      . methocarbamol (ROBAXIN) tablet 750 mg  750 mg Oral BID PRN Lyn Records III, MD      . multivitamin with minerals tablet 1 tablet  1 tablet Oral Daily Lyn Records III, MD   1 tablet at 02/05/13 1007  . nitroGLYCERIN (NITROSTAT) SL tablet 0.4 mg  0.4 mg Sublingual Q5 Min x 3 PRN Lyn Records III, MD      . nitroGLYCERIN 0.2 mg/mL in dextrose 5 % infusion  10 mcg/min Intravenous Continuous Lyn Records III, MD 3 mL/hr at 02/06/13 1506 10 mcg/min at 02/06/13 1506  . ondansetron (ZOFRAN) injection 4 mg  4 mg Intravenous Q6H PRN Lyn Records III, MD      . oxyCODONE-acetaminophen (PERCOCET/ROXICET) 5-325 MG per tablet 1-2 tablet  1-2 tablet Oral Q4H PRN Lyn Records III, MD      . zolpidem [facility]) tablet 5 mg  5 mg Oral QHS PRN Lesleigh Noe, MD        Allergies  Allergen Reactions  . Penicillins Hives, Itching and Rash      Review of Systems:  General review of systems is reviewed in the chart. There are no other pertinent clinical findings     Physical Exam:   BP 134/81  Pulse 77  Temp(Src) 98.3 F (36.8 C) (Oral)  Resp 18  Ht 5\' 8"  (1.727 m)  Wt 79.1 kg (174 lb 6.1 oz)  BMI 26.52 kg/m2  SpO2 100%  General:  well-appearing  HEENT:  Unremarkable   Neck:   no JVD, no bruits, no adenopathy   Chest:   clear to auscultation, symmetrical breath sounds, no wheezes, no rhonchi   CV:   RRR, no murmur   Abdomen:  soft,  non-tender, no masses   Extremities:  warm, well-perfused, pulses diminished  Rectal/GU  Deferred  Neuro:   Grossly non-focal and symmetrical throughout  Skin:   Clean and dry, no rashes, no breakdown  Diagnostic Tests:  Diagnostic Cardiac Catheterization Report   WASSIM KIRKSEY  67 y.o.  male  [birth year]  Procedure Date: 02/06/2013  Referring Physician: Cam Hai, M.D.  Primary Cardiologist:: HWB Katrinka Blazing, M.D.  PROCEDURE: Left heart catheterization with selective coronary angiography, left ventriculogram.  INDICATIONS: Acute coronary syndrome  The risks, benefits, and details of the procedure were explained to the patient. The patient verbalized understanding and wanted to proceed. Informed written consent was obtained.  PROCEDURE TECHNIQUE: After Xylocaine anesthesia a 5 French sheath was placed in the right femoral artery with a single anterior needle wall stick. We attempted right radial angiography but were unsuccessful due to intense radial artery spasm after cannulating and attempting to pass the wire. We then noticed that the aorta was tortuous and dilated as we rounded the aortic arch to perform coronary angiography. Multiple catheter exchanges were necessary because of aortic tortuosity. Coronary angiography was done using a 5 French MP A2, JR 4, and JL 5 catheter. Left ventriculography was done using a 5 [place] JR 4 catheter. Hand injection was used.  Because of the aortic tortuosity and dilatation, we needed to use the guidewire to cross the aortic valve with a Judkins right catheter. We were unable to obtain any coronary shots with multipurpose catheter. During left coronary angiography was began noticing that the patient tended to thrash around on the table and was unable to follow commands. Neurological assessment clearly demonstrated evidence of left brain injury with a left gaze preference and speech difficulty. We finish taking the pictures of the left coronary and initiated  CODE STROKE protocol .  CONTRAST: Total of 100 cc.  COMPLICATIONS: Embolic stroke involving left brain. The case was terminated and code stroke protocol initiated. Midway through the procedure after 3 catheter exchanges the patient began to move around on the table and became unable to follow commands. Assessment demonstrated speech impairment, left gaze preference, and inability to follow commands.  HEMODYNAMICS: Aortic pressure was 114/72 mmHg; LV pressure was 124/21; LVEDP 21 mm mercury. There was no gradient between the left ventricle and aorta.  ANGIOGRAPHIC DATA: The aorta appeared to be tortuous and dilated and was demonstrated by cinefluoroscopy before attempting coronary angiography.  The left main coronary artery is large and widely patent.  The left anterior descending artery is moderate to heavily calcified proximally. In the proximal to mid vessel distal to the first septal perforator there is a segmental stenosis with up to 99% obstruction with TIMI grade 2 flow. A small to moderate diagonal branch arises from the distal portion of this segmental LAD stenosis. The diagonal is diffusely diseased throughout the proximal third of the vessel with up to 90% stenosis.. This bifurcation lesion is a [place] 1, 0, 1.  The left circumflex artery is patent. He gives origin to 3 obtuse marginal branches. The first 2 marginals are large. The first obtuse marginal contains proximal segmental 50% stenosis. The second obtuse marginal which is the largest of the 3 contains ostial 80% stenosis. The proximal portion of the third obtuse marginal is 99% obstructed..  The right coronary artery is tortuous with a shepherd's screw origin. Moderate least severe disease is noted throughout the mid vessel with 3 lesions, eccentric in nature in the 50-70% range. The distal vessel contains concentric 50% stenosis.  LEFT VENTRICULOGRAM: Left ventricular angiogram was done in the 30 RAO projection and revealed mild apical  wall motion abnormality and preserved systolic function with an estimated ejection fraction of 60 %.  IMPRESSIONS: 1. Acute left brain stroke complicating a complicated angiographic procedure  2. Multivessel coronary disease with high-grade mid LAD (culprit), significant obstruction in each to 3 obtuse marginal branches, high-grade obstruction  in the first diagonal, and moderate multifocal mid RCA disease.  3. Overall normal left ventricular function  RECOMMENDATION: Surgical revascularization versus culprit PCI for management of coronary disease.  The major and most pressing issue at this time is the patient's neurological status. Code stroke is been initiated and the neurologist is here assessing the patient..    *RADIOLOGY REPORT*  Clinical Data: Code stroke. Sudden onset expressive aphasia.  CT HEAD WITHOUT CONTRAST  Technique: Contiguous axial images were obtained from the base of  the skull through the vertex without contrast.  Comparison: None.  Findings: There is high attenuation of the superior sagittal sinus  and other dural venous sinuses, most compatible with contrast  administration associated with recent catheterization. All of the  intravascular compartment demonstrate uniformly high attenuation.  No hemorrhage, no hemorrhage, mass lesion, mass effect, or midline  shift. No acute infarct. Calvarium intact. Mild atrophy. Benign  basal ganglia calcifications present bilaterally.  IMPRESSION:  Mild atrophy without acute intracranial abnormality. Critical  Value/emergent results were called by telephone at the time of  interpretation on 02/06/2013 at 1016 hours to P A Felicie Morn, who  verbally acknowledged these results.  High attenuation of the intravascular compartment consistent with  recent catheterization and contrast administration.  Original Report Authenticated By: Andreas Newport, M.D.    RADIOLOGY REPORT*  Clinical Data: 67 year old male with left gaze deviation  and  speech difficulty. Code stroke.  Comparison: Head CT without contrast 1008 hours.  LIMITED MRI HEAD WITHOUT CONTRAST  Technique: Multiplanar, multiecho pulse sequences of the brain and  surrounding structures were obtained according to standard protocol  without intravenous contrast.  Findings: Due to timeliness considerations for t-PA administration,  only axial diffusion weighted imaging of the brain was obtained for  this component of the study.  There is a 10 mm linear area of restricted diffusion in the left  posterior insula/external capsule (series 300 image 15). There may  be occasional superimposed punctate areas of restricted diffusion  elsewhere in the posterior left MCA territory (e.g. periatrial  white matter on series 3 image 17).  No contralateral or posterior fossa restricted diffusion.  No midline shift, mass effect, or evidence of mass lesion. No  ventriculomegaly. No findings to suggest acute intracranial  hemorrhage on these images. Cerebral volume is within normal limits  for age.  IMPRESSION:  1. Small left posterior insula/external capsule acute infarct.  There may be several other punctate acute infarcts elsewhere in the  posterior left MCA territory.  2. Due to Code stroke TPA considerations, the study was truncated.  3. See MRA findings below.  MRA HEAD WITHOUT CONTRAST  Technique: Angiographic images of the Circle of Willis were  obtained using MRA technique without intravenous contrast.  Findings: Antegrade flow in the posterior circulation. Mildly  dominant distal left vertebral artery. No distal vertebral artery  stenosis. Tortuous but otherwise negative vertebrobasilar  junction.  Intermittent mild motion artifact occurs.  No basilar stenosis. SCA and PCA origins are normal. Diminutive  bilateral posterior communicating arteries. Bilateral PCA branches  are within normal limits.  Antegrade flow in both ICA siphons. Mildly tortuous distal    cervical ICA. No ICA siphon stenosis. Ophthalmic and posterior  communicating artery origins are within normal limits.  Patent carotid termini. MCA and ACA origins are within normal  limits.  Mildly tortuous but otherwise negative visualized ACA branches.  Right MCA M1 segment and visualized right MCA branches are within  normal limits.  Left MCA M1  segment is patent and within normal limits. There is a  left MCA trifurcation, patent. There is decreased or absent flow  in the a solitary posterior division left M2 branch (series 4  images 103 - 111 and MIP series 403 image 5). There is evidence of  preserved distal flow in this branch. Other visualized left MCA  branches are within normal limits.  IMPRESSION:  1. Focal segment of non visualized flow in a left posterior MCA M2  branch as detailed above.  2. Otherwise the major left MCA branches, and the anterior  circulation, are within normal limits.  3. Negative posterior circulation.  Study discussed by telephone with Dr. Roseanne Reno, Neurology on  02/06/2013 at 1143 hours.  Original Report Authenticated By: Erskine Speed, M.D.    Impression:  The patient has three-vessel coronary artery disease and presents with unstable angina. He has very tight 99% stenosis of the mid left anterior descending coronary artery, which is clearly the culprit vessel and the source of this presenting symptoms. He has moderate disease in the left circumflex territory with perhaps 50% ostial stenosis of large first obtuse marginal branch and high-grade 90% stenosis of the terminal portions of his smaller second obtuse marginal branch. There is moderate nonobstructive disease in the right coronary artery with perhaps 50% stenosis in the proximal and mid segment. The patient suffered an acute embolic stroke during catheterization and seems to be recovering reasonably well.    Plan:  Based upon the patient's coronary anatomy and his presentation, I would favor  PCI and stenting of the left anterior descending coronary artery.   The patient does not have critical disease in either the left circumflex or the right coronary artery territories, and with the patient's development of acute stroke I do not feel that it would be wise to proceed with surgical revascularization. Off pump coronary artery bypass grafting to left anterior descending coronary artery could be contemplated as an alternative if PCI and stenting the left anterior descending coronary artery were not technically feasible.     Salvatore Decent. Cornelius Moras, MD 02/06/2013 6:24 PM  I spent in excess of 30 minutes of time directly involved in the conduct of this consultation.

## 2013-02-06 NOTE — Consult Note (Signed)
Referring Physician: Verdis Prime    Chief Complaint: Code Stroke  HPI:                                                                                                                                         Daniel Cox is an 67 y.o. male who was getting a cardiac catheterization today when he suffered sudden onset right hemiplegia, expressive and receptive aphasia.  At time of arrival patients hemiplegia was resolving but continued to show aphasia.receptive > expressive. Post CT scan, while waiting for coag studies patients speech improved and he was able to tell us his age, month, year, name objects and follow commands.  In addition his right arm strength significantly improved.   Date last known well: 6.12.14 Time last known well: 08.42 NIHSS--initial 5 but end result was NIHSS 0  tPA Given: No: resolution of symptoms  Past Medical History  Diagnosis Date  . Chronic kidney disease   . Hyperlipidemia   . Erectile dysfunction   . Degenerative disc disease, cervical   . Degenerative lumbar disc     Past Surgical History  Procedure Laterality Date  . Right shoulder    . Appendectony      History reviewed. No pertinent family history. Social History:  reports that he has been smoking.  He does not have any smokeless tobacco history on file. He reports that he does not drink alcohol or use illicit drugs.  Allergies:  Allergies  Allergen Reactions  . Penicillins Hives, Itching and Rash    Medications:                                                                                                                           Prior to Admission:  Prescriptions prior to admission  Medication Sig Dispense Refill  . cyclobenzaprine (FLEXERIL) 10 MG tablet Take 10 mg by mouth at bedtime as needed for muscle spasms.      Marland Kitchen HYDROcodone-acetaminophen (NORCO/VICODIN) 5-325 MG per tablet Take 1 tablet by mouth every 6 (six) hours as needed for pain.      . methocarbamol (ROBAXIN) 750 MG  tablet Take 750 mg by mouth 2 (two) times daily as needed (for shoulder, neck, arm pain).      . Multiple Vitamin (MULTIVITAMIN WITH MINERALS) TABS Take 1 tablet by mouth daily.      Marland Kitchen  rosuvastatin (CRESTOR) 40 MG tablet Take 20 mg by mouth at bedtime.       Scheduled: . sodium chloride  250 mL Intravenous Once  . sodium chloride  250 mL Intravenous Once  . alteplase  0.9 mg/kg Intravenous Once  . [facility] HOLD] aspirin EC  81 mg Oral Daily  . [facility] HOLD] atorvastatin  40 mg Oral q1800  . [MAR HOLD] multivitamin with minerals  1 tablet Oral Daily  . sodium chloride  3 mL Intravenous Q12H    ROS:                                                                                                                                       History obtained from the patient  General ROS: negative for - chills, fatigue, fever, night sweats, weight gain or weight loss Psychological ROS: negative for - behavioral disorder, hallucinations, memory difficulties, mood swings or suicidal ideation Ophthalmic ROS: negative for - blurry vision, double vision, eye pain or loss of vision ENT ROS: negative for - epistaxis, nasal discharge, oral lesions, sore throat, tinnitus or vertigo Allergy and Immunology ROS: negative for - hives or itchy/watery eyes Hematological and Lymphatic ROS: negative for - bleeding problems, bruising or swollen lymph nodes Endocrine ROS: negative for - galactorrhea, hair pattern changes, polydipsia/polyuria or temperature intolerance Respiratory ROS: negative for - cough, hemoptysis, shortness of breath or wheezing Cardiovascular ROS: negative for - chest pain, dyspnea on exertion, edema or irregular heartbeat Gastrointestinal ROS: negative for - abdominal pain, diarrhea, hematemesis, nausea/vomiting or stool incontinence Genito-Urinary ROS: negative for - dysuria, hematuria, incontinence or urinary frequency/urgency Musculoskeletal ROS: negative for - joint swelling or muscular  weakness Neurological ROS: as noted in HPI Dermatological ROS: negative for rash and skin lesion changes  Neurologic Examination:                                                                                                      Blood pressure 137/87, pulse 52, temperature 98.1 F (36.7 C), temperature source Oral, resp. rate 18, height 5\' 8"  (1.727 m), weight 79.1 kg (174 lb 6.1 oz), SpO2 99.00%.   Mental Status: Alert, oriented, thought content appropriate.  Speech initially aphasic but over time cleared to become fluent without evidence of aphasia.  Able to follow 3 step commands without difficulty. Cranial Nerves: II: Discs flat bilaterally; Visual fields grossly normal, pupils equal, round, reactive to light and accommodation III,IV, VI: ptosis not present, extra-ocular motions intact bilaterally V,VII: face  initially asymmetric on the right but cleared, facial light touch sensation normal bilaterally VIII: hearing normal bilaterally IX,X: gag reflex present XI: bilateral shoulder shrug XII: midline tongue extension Motor: Right : Upper extremity   5/5    Left:     Upper extremity   5/5  Lower extremity   5/5     Lower extremity   5/5 Tone and bulk:normal tone throughout; no atrophy noted Sensory: Pinprick and light touch intact throughout, bilaterally Deep Tendon Reflexes: 2+ and symmetric throughout Plantars: Right: downgoing   Left: downgoing Cerebellar: normal finger-to-nose,  normal heel-to-shin test CV: pulses palpable throughout    Results for orders placed during the hospital encounter of 02/04/13 (from the past 48 hour(s))  CBC     Status: None   Collection Time    02/04/13  2:47 PM      Result Value Range   WBC 6.1  4.0 - 10.5 K/uL   RBC 4.28  4.22 - 5.81 MIL/uL   Hemoglobin 14.1  13.0 - 17.0 g/dL   HCT 40.9  81.1 - 91.4 %   MCV 96.0  78.0 - 100.0 fL   MCH 32.9  26.0 - 34.0 pg   MCHC 34.3  30.0 - 36.0 g/dL   RDW 78.2  95.6 - 21.3 %   Platelets 205  150 -  400 K/uL  BASIC METABOLIC PANEL     Status: Abnormal   Collection Time    02/04/13  2:47 PM      Result Value Range   Sodium 141  135 - 145 mEq/L   Potassium 4.1  3.5 - 5.1 mEq/L   Chloride 105  96 - 112 mEq/L   CO2 27  19 - 32 mEq/L   Glucose, Bld 85  70 - 99 mg/dL   BUN 17  6 - 23 mg/dL   Creatinine, Ser 0.86 (*) 0.50 - 1.35 mg/dL   Calcium 9.5  8.4 - 57.8 mg/dL   GFR calc non Af Amer 50 (*) >90 mL/min   GFR calc Af Amer 58 (*) >90 mL/min   Comment:            The eGFR has been calculated     using the CKD EPI equation.     This calculation has not been     validated in all clinical     situations.     eGFR's persistently     <90 mL/min signify     possible Chronic Kidney Disease.  POCT I-STAT TROPONIN I     Status: Abnormal   Collection Time    02/04/13  3:03 PM      Result Value Range   Troponin i, poc 0.27 (*) 0.00 - 0.08 ng/mL   Comment NOTIFIED PHYSICIAN     Comment 3            Comment: Due to the release kinetics of cTnI,     a negative result within the first hours     of the onset of symptoms does not rule out     myocardial infarction with certainty.     If myocardial infarction is still suspected,     repeat the test at appropriate intervals.  TSH     Status: None   Collection Time    02/04/13  7:55 PM      Result Value Range   TSH 0.924  0.350 - 4.500 uIU/mL  COMPREHENSIVE METABOLIC PANEL     Status: Abnormal   Collection  Time    02/04/13  7:55 PM      Result Value Range   Sodium 138  135 - 145 mEq/L   Potassium 3.8  3.5 - 5.1 mEq/L   Chloride 103  96 - 112 mEq/L   CO2 26  19 - 32 mEq/L   Glucose, Bld 174 (*) 70 - 99 mg/dL   BUN 19  6 - 23 mg/dL   Creatinine, Ser 1.61  0.50 - 1.35 mg/dL   Calcium 8.9  8.4 - 09.6 mg/dL   Total Protein 6.7  6.0 - 8.3 g/dL   Albumin 3.6  3.5 - 5.2 g/dL   AST 25  0 - 37 U/L   ALT 17  0 - 53 U/L   Alkaline Phosphatase 87  39 - 117 U/L   Total Bilirubin 0.4  0.3 - 1.2 mg/dL   GFR calc non Af Amer 56 (*) >90 mL/min    GFR calc Af Amer 64 (*) >90 mL/min   Comment:            The eGFR has been calculated     using the CKD EPI equation.     This calculation has not been     validated in all clinical     situations.     eGFR's persistently     <90 mL/min signify     possible Chronic Kidney Disease.  HEMOGLOBIN A1C     Status: None   Collection Time    02/04/13  7:55 PM      Result Value Range   Hemoglobin A1C 5.6  <5.7 %   Comment: (NOTE)                                                                               According to the ADA Clinical Practice Recommendations for 2011, when     HbA1c is used as a screening test:      >=6.5%   Diagnostic of Diabetes Mellitus               (if abnormal result is confirmed)     5.7-6.4%   Increased risk of developing Diabetes Mellitus     References:Diagnosis and Classification of Diabetes Mellitus,Diabetes     Care,2011,34(Suppl 1):S62-S69 and Standards of Medical Care in             Diabetes - 2011,Diabetes Care,2011,34 (Suppl 1):S11-S61.   Mean Plasma Glucose 114  <117 mg/dL  TROPONIN I     Status: Abnormal   Collection Time    02/04/13  8:16 PM      Result Value Range   Troponin I 0.69 (*) <0.30 ng/mL   Comment:            Due to the release kinetics of cTnI,     a negative result within the first hours     of the onset of symptoms does not rule out     myocardial infarction with certainty.     If myocardial infarction is still suspected,     repeat the test at appropriate intervals.     CRITICAL RESULT CALLED TO, READ BACK BY AND VERIFIED WITH:     SPARDING  S,RN 02/04/13 2106 WAYK  PRO B NATRIURETIC PEPTIDE     Status: Abnormal   Collection Time    02/04/13  8:16 PM      Result Value Range   Pro B Natriuretic peptide (BNP) 344.1 (*) 0 - 125 pg/mL  HEPARIN LEVEL (UNFRACTIONATED)     Status: None   Collection Time    02/05/13 12:25 AM      Result Value Range   Heparin Unfractionated 0.44  0.30 - 0.70 IU/mL   Comment:            IF  HEPARIN RESULTS ARE BELOW     EXPECTED VALUES, AND PATIENT     DOSAGE HAS BEEN CONFIRMED,     SUGGEST FOLLOW UP TESTING     OF ANTITHROMBIN III LEVELS.  TROPONIN I     Status: Abnormal   Collection Time    02/05/13 12:30 AM      Result Value Range   Troponin I 0.56 (*) <0.30 ng/mL   Comment:            Due to the release kinetics of cTnI,     a negative result within the first hours     of the onset of symptoms does not rule out     myocardial infarction with certainty.     If myocardial infarction is still suspected,     repeat the test at appropriate intervals.     CRITICAL VALUE NOTED.  VALUE IS CONSISTENT WITH PREVIOUSLY REPORTED AND CALLED VALUE.  LIPID PANEL     Status: Abnormal   Collection Time    02/05/13 12:30 AM      Result Value Range   Cholesterol 119  0 - 200 mg/dL   Triglycerides 782  <956 mg/dL   HDL 35 (*) >21 mg/dL   Total CHOL/HDL Ratio 3.4     VLDL 27  0 - 40 mg/dL   LDL Cholesterol 57  0 - 99 mg/dL   Comment:            Total Cholesterol/HDL:CHD Risk     Coronary Heart Disease Risk Table                         Men   Women      1/2 Average Risk   3.4   3.3      Average Risk       5.0   4.4      2 X Average Risk   9.6   7.1      3 X Average Risk  23.4   11.0                Use the calculated Patient Ratio     above and the CHD Risk Table     to determine the patient's CHD Risk.                ATP III CLASSIFICATION (LDL):      <100     mg/dL   Optimal      308-657  mg/dL   Near or Above                        Optimal      130-159  mg/dL   Borderline      846-962  mg/dL   High      >952     mg/dL   Very High  BASIC METABOLIC PANEL     Status: Abnormal   Collection Time    02/05/13 12:30 AM      Result Value Range   Sodium 140  135 - 145 mEq/L   Potassium 3.7  3.5 - 5.1 mEq/L   Chloride 104  96 - 112 mEq/L   CO2 28  19 - 32 mEq/L   Glucose, Bld 102 (*) 70 - 99 mg/dL   BUN 21  6 - 23 mg/dL   Creatinine, Ser 1.61 (*) 0.50 - 1.35 mg/dL    Calcium 8.8  8.4 - 09.6 mg/dL   GFR calc non Af Amer 47 (*) >90 mL/min   GFR calc Af Amer 54 (*) >90 mL/min   Comment:            The eGFR has been calculated     using the CKD EPI equation.     This calculation has not been     validated in all clinical     situations.     eGFR's persistently     <90 mL/min signify     possible Chronic Kidney Disease.  TROPONIN I     Status: Abnormal   Collection Time    02/05/13  7:30 AM      Result Value Range   Troponin I 0.33 (*) <0.30 ng/mL   Comment:            Due to the release kinetics of cTnI,     a negative result within the first hours     of the onset of symptoms does not rule out     myocardial infarction with certainty.     If myocardial infarction is still suspected,     repeat the test at appropriate intervals.     CRITICAL VALUE NOTED.  VALUE IS CONSISTENT WITH PREVIOUSLY REPORTED AND CALLED VALUE.  HEPARIN LEVEL (UNFRACTIONATED)     Status: None   Collection Time    02/05/13 10:25 AM      Result Value Range   Heparin Unfractionated 0.49  0.30 - 0.70 IU/mL   Comment:            IF HEPARIN RESULTS ARE BELOW     EXPECTED VALUES, AND PATIENT     DOSAGE HAS BEEN CONFIRMED,     SUGGEST FOLLOW UP TESTING     OF ANTITHROMBIN III LEVELS.  HEPARIN LEVEL (UNFRACTIONATED)     Status: None   Collection Time    02/06/13  4:35 AM      Result Value Range   Heparin Unfractionated 0.47  0.30 - 0.70 IU/mL   Comment:            IF HEPARIN RESULTS ARE BELOW     EXPECTED VALUES, AND PATIENT     DOSAGE HAS BEEN CONFIRMED,     SUGGEST FOLLOW UP TESTING     OF ANTITHROMBIN III LEVELS.  BASIC METABOLIC PANEL     Status: Abnormal   Collection Time    02/06/13  4:35 AM      Result Value Range   Sodium 137  135 - 145 mEq/L   Potassium 3.8  3.5 - 5.1 mEq/L   Chloride 107  96 - 112 mEq/L   CO2 24  19 - 32 mEq/L   Glucose, Bld 99  70 - 99 mg/dL   BUN 15  6 - 23 mg/dL   Creatinine, Ser 0.45  0.50 - 1.35 mg/dL   Calcium 8.6  8.4 -  10.5  mg/dL   GFR calc non Af Amer 59 (*) >90 mL/min   GFR calc Af Amer 68 (*) >90 mL/min   Comment:            The eGFR has been calculated     using the CKD EPI equation.     This calculation has not been     validated in all clinical     situations.     eGFR's persistently     <90 mL/min signify     possible Chronic Kidney Disease.  CBC     Status: Abnormal   Collection Time    02/06/13  4:35 AM      Result Value Range   WBC 5.3  4.0 - 10.5 K/uL   RBC 3.64 (*) 4.22 - 5.81 MIL/uL   Hemoglobin 11.9 (*) 13.0 - 17.0 g/dL   HCT 16.1 (*) 09.6 - 04.5 %   MCV 95.3  78.0 - 100.0 fL   MCH 32.7  26.0 - 34.0 pg   MCHC 34.3  30.0 - 36.0 g/dL   RDW 40.9  81.1 - 91.4 %   Platelets 176  150 - 400 K/uL  PROTIME-INR     Status: None   Collection Time    02/06/13  7:30 AM      Result Value Range   Prothrombin Time 13.1  11.6 - 15.2 seconds   INR 1.00  0.00 - 1.49  GLUCOSE, CAPILLARY     Status: None   Collection Time    02/06/13  9:45 AM      Result Value Range   Glucose-Capillary 85  70 - 99 mg/dL  APTT     Status: Abnormal   Collection Time    02/06/13 10:10 AM      Result Value Range   aPTT 38 (*) 24 - 37 seconds   Comment:            IF BASELINE aPTT IS ELEVATED,     SUGGEST PATIENT RISK ASSESSMENT     BE USED TO DETERMINE APPROPRIATE     ANTICOAGULANT THERAPY.  CBC WITH DIFFERENTIAL     Status: Abnormal   Collection Time    02/06/13 10:10 AM      Result Value Range   WBC 4.7  4.0 - 10.5 K/uL   RBC 3.73 (*) 4.22 - 5.81 MIL/uL   Hemoglobin 12.3 (*) 13.0 - 17.0 g/dL   HCT 78.2 (*) 95.6 - 21.3 %   MCV 94.6  78.0 - 100.0 fL   MCH 33.0  26.0 - 34.0 pg   MCHC 34.8  30.0 - 36.0 g/dL   RDW 08.6  57.8 - 46.9 %   Platelets 151  150 - 400 K/uL   Neutrophils Relative % 33 (*) 43 - 77 %   Neutro Abs 1.5 (*) 1.7 - 7.7 K/uL   Lymphocytes Relative 53 (*) 12 - 46 %   Lymphs Abs 2.5  0.7 - 4.0 K/uL   Monocytes Relative 9  3 - 12 %   Monocytes Absolute 0.4  0.1 - 1.0 K/uL   Eosinophils  Relative 5  0 - 5 %   Eosinophils Absolute 0.2  0.0 - 0.7 K/uL   Basophils Relative 0  0 - 1 %   Basophils Absolute 0.0  0.0 - 0.1 K/uL  HEPARIN LEVEL (UNFRACTIONATED)     Status: Abnormal   Collection Time    02/06/13 10:10 AM      Result Value  Range   Heparin Unfractionated 0.13 (*) 0.30 - 0.70 IU/mL   Comment:            IF HEPARIN RESULTS ARE BELOW     EXPECTED VALUES, AND PATIENT     DOSAGE HAS BEEN CONFIRMED,     SUGGEST FOLLOW UP TESTING     OF ANTITHROMBIN III LEVELS.  PROTIME-INR     Status: None   Collection Time    02/06/13 10:10 AM      Result Value Range   Prothrombin Time 13.5  11.6 - 15.2 seconds   INR 1.04  0.00 - 1.49  COMPREHENSIVE METABOLIC PANEL     Status: Abnormal   Collection Time    02/06/13 10:10 AM      Result Value Range   Sodium 134 (*) 135 - 145 mEq/L   Potassium 4.4  3.5 - 5.1 mEq/L   Chloride 104  96 - 112 mEq/L   CO2 23  19 - 32 mEq/L   Glucose, Bld 84  70 - 99 mg/dL   BUN 13  6 - 23 mg/dL   Creatinine, Ser 1.61  0.50 - 1.35 mg/dL   Calcium 8.3 (*) 8.4 - 10.5 mg/dL   Total Protein 6.3  6.0 - 8.3 g/dL   Albumin 3.2 (*) 3.5 - 5.2 g/dL   AST 21  0 - 37 U/L   ALT 15  0 - 53 U/L   Alkaline Phosphatase 84  39 - 117 U/L   Total Bilirubin 0.3  0.3 - 1.2 mg/dL   GFR calc non Af Amer 59 (*) >90 mL/min   GFR calc Af Amer 69 (*) >90 mL/min   Comment:            The eGFR has been calculated     using the CKD EPI equation.     This calculation has not been     validated in all clinical     situations.     eGFR's persistently     <90 mL/min signify     possible Chronic Kidney Disease.  CK TOTAL AND CKMB     Status: None   Collection Time    02/06/13 10:17 AM      Result Value Range   Total CK 120  7 - 232 U/L   CK, MB 1.7  0.3 - 4.0 ng/mL   Relative Index 1.4  0.0 - 2.5   Dg Chest 2 View  02/04/2013   *RADIOLOGY REPORT*  Clinical Data: Chest and shoulder pain.  Smoker.  CHEST - 2 VIEW  Comparison: 10/24/2006  Findings: Borderline  cardiomegaly is stable, as well as ectasia of the thoracic aorta.  Both lungs are clear.  No evidence of pleural effusion.  No mass or lymphadenopathy identified.  IMPRESSION: Stable exam.  No active disease.   Original Report Authenticated By: Myles Rosenthal, M.D.   Ct Head Wo Contrast  02/06/2013   *RADIOLOGY REPORT*  Clinical Data: Code stroke. Sudden onset expressive aphasia.  CT HEAD WITHOUT CONTRAST  Technique:  Contiguous axial images were obtained from the base of the skull through the vertex without contrast.  Comparison: None.  Findings: There is high attenuation of the superior sagittal sinus and other dural venous sinuses, most compatible with contrast administration associated with recent catheterization.  All of the intravascular compartment demonstrate uniformly high attenuation. No hemorrhage, no hemorrhage, mass lesion, mass effect, or midline shift. No acute infarct.  Calvarium intact.  Mild atrophy. Benign basal ganglia calcifications  present bilaterally.  IMPRESSION: Mild atrophy without acute intracranial abnormality. Critical Value/emergent results were called by telephone at the time of interpretation on 02/06/2013 at 1016 hours to P A Felicie Morn, who verbally acknowledged these results.  High attenuation of the intravascular compartment consistent with recent catheterization and contrast administration.   Original Report Authenticated By: Andreas Newport, M.D.    Assessment and plan discussed with with attending physician and they are in agreement.    Felicie Morn PA-C [facility] [phone]  02/06/2013, 11:20 AM   Assessment: 67 y.o. male presenting with initial left MCA territory ischemic symptoms with residual acute left insular cortex infarct, likely embolic.    Stroke Risk Factors - hyperlipidemia  Plan: 1. HgbA1c, fasting lipid panel 2. Echocardiogram 3. Carotid dopplers 4. Prophylactic therapy-Antiplatelet med: Aspirin - dose 81 mg daily 5. Risk factor  modification 6. Telemetry monitoring 7. Frequent neuro checks 8. PT/OT SLP  Felicie Morn PA-C [facility] [phone]  I personally participated in this patient's evaluation and management, including neurological examination and formulating the above clinical assessment and management recommendations.  Venetia Maxon M.D. [facility] [phone]

## 2013-02-07 DIAGNOSIS — I200 Unstable angina: Secondary | ICD-10-CM

## 2013-02-07 DIAGNOSIS — I669 Occlusion and stenosis of unspecified cerebral artery: Secondary | ICD-10-CM | POA: Diagnosis not present

## 2013-02-07 LAB — HEMOGLOBIN A1C
Hgb A1c MFr Bld: 5.9 % — ABNORMAL HIGH (ref <5.7)
Mean Plasma Glucose: 123 mg/dL — ABNORMAL HIGH (ref <117)

## 2013-02-07 LAB — BASIC METABOLIC PANEL
Calcium: 8.6 mg/dL (ref 8.4–10.5)
GFR calc Af Amer: 67 mL/min — ABNORMAL LOW (ref >90)
GFR calc non Af Amer: 58 mL/min — ABNORMAL LOW (ref >90)
Potassium: 3.9 mEq/L (ref 3.5–5.1)
Sodium: 135 mEq/L (ref 135–145)

## 2013-02-07 LAB — CBC
Platelets: 167 10*3/uL (ref 150–400)
RBC: 3.61 MIL/uL — ABNORMAL LOW (ref 4.22–5.81)
RDW: 12.2 % (ref 11.5–15.5)
WBC: 6.5 10*3/uL (ref 4.0–10.5)

## 2013-02-07 LAB — LIPID PANEL
Cholesterol: 128 mg/dL (ref 0–200)
HDL: 35 mg/dL — ABNORMAL LOW (ref >39)
Triglycerides: 80 mg/dL (ref <150)

## 2013-02-07 MED ORDER — CLOPIDOGREL BISULFATE 75 MG PO TABS
75.0000 mg | ORAL_TABLET | Freq: Every day | ORAL | Status: DC
  Administered 2013-02-08 – 2013-02-09 (×2): 75 mg via ORAL
  Filled 2013-02-07 (×5): qty 1

## 2013-02-07 MED ORDER — METOPROLOL TARTRATE 12.5 MG HALF TABLET
12.5000 mg | ORAL_TABLET | Freq: Two times a day (BID) | ORAL | Status: DC
  Administered 2013-02-07 – 2013-02-10 (×8): 12.5 mg via ORAL
  Filled 2013-02-07 (×10): qty 1

## 2013-02-07 NOTE — Evaluation (Addendum)
Physical Therapy Evaluation Patient Details Name: Daniel Cox MRN: [id number] DOB: Jan 30, 1946 Today's Date: 02/07/2013 Time: 5621-3086 PT Time Calculation (min): 16 min  PT Assessment / Plan / Recommendation Clinical Impression  Patient is a 67 yo male with acute coronary syndrome who had a stroke during cardiac procedure.  Patient currently presents with symmetrical strength.  Has slightly decreased balance with high level balance activities, and issues with speech. Recommend SLP Consult to address these issues.   Will follow acutely to address balance issues.  Do not anticipate any f/u PT needs.    PT Assessment  Patient needs continued PT services    Follow Up Recommendations  No PT follow up;Supervision - Intermittent    Does the patient have the potential to tolerate intense rehabilitation      Barriers to Discharge None      Equipment Recommendations  None recommended by PT    Recommendations for Other Services  Speech Therapy Consult  Frequency Min 4X/week    Precautions / Restrictions Precautions Precautions: None Restrictions Weight Bearing Restrictions: No   Pertinent Vitals/Pain       Mobility  Bed Mobility Bed Mobility: Supine to Sit;Sitting - Scoot to Edge of Bed Supine to Sit: 7: Independent Sitting - Scoot to Edge of Bed: 7: Independent Details for Bed Mobility Assistance: No cues or assist needed Transfers Transfers: Sit to Stand;Stand to Sit Sit to Stand: 7: Independent;From bed Stand to Sit: 7: Independent;To bed Details for Transfer Assistance: No cues or assist needed Ambulation/Gait Ambulation/Gait Assistance: 5: Supervision Ambulation Distance (Feet): 200 Feet Assistive device: None Ambulation/Gait Assistance Details: No cues needed.  Slight decrease in balance noted with high level activities. Gait Pattern: Within Functional Limits Gait velocity: WFL Modified Rankin (Stroke Patients Only) Pre-Morbid Rankin Score: No symptoms Modified  Rankin: No significant disability    Exercises     PT Diagnosis: Abnormality of gait  PT Problem List: Decreased balance;Decreased mobility PT Treatment Interventions: Gait training;Stair training;Functional mobility training;Balance training;Patient/family education   PT Goals Acute Rehab PT Goals PT Goal Formulation: With patient/family Time For Goal Achievement: 02/14/13 Potential to Achieve Goals: Good Pt will Ambulate: >150 feet;Independently (without loss of balance) PT Goal: Ambulate - Progress: Goal set today Pt will Go Up / Down Stairs: 3-5 stairs;with modified independence;with rail(s);with least restrictive assistive device PT Goal: Up/Down Stairs - Progress: Goal set today Additional Goals Additional Goal #1: Patient will score > 19 on DGI balance assessment to indicate low fall risk. PT Goal: Additional Goal #1 - Progress: Goal set today  Visit Information  Last PT Received On: 02/07/13 Assistance Needed: +1    Subjective Data  Subjective: "That felt good to walk" Patient Stated Goal: To go home soon   Prior Functioning  Home Living Lives With: Spouse Available Help at Discharge: Family;Available 24 hours/day Type of Home: House Home Access: Stairs to enter [employer] of Steps: 4 Entrance Stairs-Rails: Right;Left Home Layout: One level Home Adaptive Equipment: None Prior Function Level of Independence: Independent Able to Take Stairs?: Yes Driving: Yes Vocation: Retired Musician: Receptive difficulties;Expressive difficulties Dominant Hand: Right    Cognition  Cognition Arousal/Alertness: Awake/alert Behavior During Therapy: WFL for tasks assessed/performed Overall Cognitive Status: Impaired/Different from baseline Area of Impairment: Following commands Following Commands: Follows one step commands inconsistently ("Lift your knee" - unable to identify knee)    Extremity/Trunk Assessment Right Upper Extremity  Assessment RUE ROM/Strength/Tone: Deficits RUE ROM/Strength/Tone Deficits: Shoulder ROM decreased; Strength WFL RUE Sensation: WFL - Light  Touch Left Upper Extremity Assessment LUE ROM/Strength/Tone: WFL for tasks assessed LUE Sensation: WFL - Light Touch Right Lower Extremity Assessment RLE ROM/Strength/Tone: WFL for tasks assessed RLE Sensation: WFL - Light Touch Left Lower Extremity Assessment LLE ROM/Strength/Tone: WFL for tasks assessed LLE Sensation: WFL - Light Touch Trunk Assessment Trunk Assessment: Normal   Balance Balance Balance Assessed: Yes High Level Balance High Level Balance Activites: Direction changes;Turns;Sudden stops;Head turns High Level Balance Comments: Noted slight decrease in balance with high level balance activities.  End of Session PT - End of Session Equipment Utilized During Treatment: Gait belt Activity Tolerance: Patient tolerated treatment well Patient left: in bed;with call bell/phone within reach;with family/visitor present (sitting EOB) Nurse Communication: Mobility status  GP     Vena Austria 02/07/2013, 5:51 PM Durenda Hurt. Renaldo Fiddler, [facility] Pager [phone]

## 2013-02-07 NOTE — Progress Notes (Signed)
Stroke Team Progress Note  HISTORY Daniel Cox is an 67 y.o. male who was getting a cardiac catheterization today when he suffered sudden onset right hemiplegia, expressive and receptive aphasia. At time of arrival patients hemiplegia was resolving but continued to show aphasia.receptive > expressive. Post CT scan, while waiting for coag studies patients speech improved and he was able to tell us his age, month, year, name objects and follow commands. In addition his right arm strength significantly improved.   Date last known well: 6.12.14  Time last known well: 08.42  NIHSS--initial 5 but end result was NIHSS 0  tPA Given: No: resolution of symptoms   SUBJECTIVE  Symptoms resolving. Feels better.  OBJECTIVE Most recent Vital Signs: Filed Vitals:   02/06/13 1600 02/06/13 1700 02/06/13 1952 02/07/13 0422  BP: 115/86 123/88 111/69 111/69  Pulse: 77 82 50 70  Temp:   98.3 F (36.8 C) 98.7 F (37.1 C)  TempSrc:   Oral Oral  Resp: 18 18 18 18   Height:      Weight:      SpO2: 99% 99% 100%    CBG (last 3)   Recent Labs  02/06/13 0945  GLUCAP 85    IV Fluid Intake:   . heparin 1,050 Units/hr (02/07/13 0541)  . nitroGLYCERIN 10 mcg/min (02/06/13 1506)    MEDICATIONS  . aspirin EC  81 mg Oral Daily  . atorvastatin  40 mg Oral q1800  . multivitamin with minerals  1 tablet Oral Daily   PRN:  acetaminophen, cyclobenzaprine, HYDROcodone-acetaminophen, methocarbamol, nitroGLYCERIN, ondansetron (ZOFRAN) IV, oxyCODONE-acetaminophen, zolpidem  Diet:  Cardiac thin liquids Activity:  Up with assistance DVT Prophylaxis:  scd  CLINICALLY SIGNIFICANT STUDIES Basic Metabolic Panel:  Recent Labs Lab 02/06/13 0435 02/06/13 1010  NA 137 134*  K 3.8 4.4  CL 107 104  CO2 24 23  GLUCOSE 99 84  BUN 15 13  CREATININE 1.25 1.24  CALCIUM 8.6 8.3*   Liver Function Tests:  Recent Labs Lab 02/04/13 1955 02/06/13 1010  AST 25 21  ALT 17 15  ALKPHOS 87 84  BILITOT 0.4 0.3   PROT 6.7 6.3  ALBUMIN 3.6 3.2*   CBC:  Recent Labs Lab 02/06/13 0435 02/06/13 1010 02/07/13 0450  WBC 5.3 4.7 6.5  NEUTROABS  --  1.5*  --   HGB 11.9* 12.3* 12.0*  HCT 34.7* 35.3* 34.1*  MCV 95.3 94.6 94.5  PLT 176 151 167   Coagulation:  Recent Labs Lab 02/06/13 0730 02/06/13 1010  LABPROT 13.1 13.5  INR 1.00 1.04   Cardiac Enzymes:  Recent Labs Lab 02/04/13 2016 02/05/13 0030 02/05/13 0730 02/06/13 1017  CKTOTAL  --   --   --  120  CKMB  --   --   --  1.7  TROPONINI 0.69* 0.56* 0.33*  --    Urinalysis: No results found for this basename: COLORURINE, APPERANCEUR, LABSPEC, PHURINE, GLUCOSEU, HGBUR, BILIRUBINUR, KETONESUR, PROTEINUR, UROBILINOGEN, NITRITE, LEUKOCYTESUR,  in the last 168 hours Lipid Panel    Component Value Date/Time   CHOL 128 02/07/2013 0458   TRIG 80 02/07/2013 0458   HDL 35* 02/07/2013 0458   CHOLHDL 3.7 02/07/2013 0458   VLDL 16 02/07/2013 0458   LDLCALC 77 02/07/2013 0458   HgbA1C  Lab Results  Component Value Date   HGBA1C 5.6 02/04/2013    Urine Drug Screen:   No results found for this basename: labopia, cocainscrnur, labbenz, amphetmu, thcu, labbarb    Alcohol Level: No results found  for this basename: ETH,  in the last 168 hours  Ct Head Wo Contrast 02/06/2013  Mild atrophy without acute intracranial abnormality. Critical Value/emergent results were called by telephone at the time of interpretation on 02/06/2013 at 1016 hours to P A Felicie Morn, who verbally acknowledged these results.  High attenuation of the intravascular compartment consistent with recent catheterization and contrast administration.   Original Report Authenticated By: Andreas Newport, M.D.   Daniel Cox Wo Contrast 02/06/2013  Limited MRI Head Wo Small left posterior insula/external capsule acute infarct. There may be several other punctate acute infarcts elsewhere in the posterior left MCA territory. 2.  Due to Code stroke TPA considerations, the study was truncated.  3.  See MRA findings below.   MRA HEAD WITHOUT  1.  Focal segment of non visualized flow in a left posterior MCA M2 branch as detailed above. 2.  Otherwise the major left MCA branches, and the anterior circulation, are within normal limits. 3.  Negative posterior circulation.    2D Echocardiogram    Carotid Doppler    CXR  Stable exam, no active disease  EKG  normal sinus rhythm.   Therapy Recommendations  Filed Vitals:   02/08/13 1347  BP: 115/71  Pulse: 57  Temp: 98 F (36.7 C)  Resp: 18      Physical Exam   Awake alert. Afebrile. Head is nontraumatic. Neck is supple without bruit. Hearing is normal. Cardiac exam no murmur or gallop. Lungs are clear to auscultation. Distal pulses are well felt. Mental Status:  Alert, oriented, thought content appropriate. Speech initially aphasic but over time cleared to become fluent without evidence of aphasia. Able to follow 3 step commands without difficulty.  Cranial Nerves:  II: Discs flat bilaterally; Visual fields grossly normal, pupils equal, round, reactive to light and accommodation  III,IV, VI: ptosis not present, extra-ocular motions intact bilaterally  V,VII: face initially asymmetric on the right but cleared, facial light touch sensation normal bilaterally  VIII: hearing normal bilaterally  IX,X: gag reflex present  XI: bilateral shoulder shrug  XII: midline tongue extension  Motor:  Right : Upper extremity 5/5 Left: Upper extremity 5/5  Lower extremity 5/5 Lower extremity 5/5  Tone and bulk:normal tone throughout; no atrophy noted  Sensory: Pinprick and light touch intact throughout, bilaterally  Deep Tendon Reflexes: 2+ and symmetric throughout  Plantars:  Right: downgoing Left: downgoing  Cerebellar:  normal finger-to-nose, normal heel-to-shin test  CV: pulses palpable throughout        ASSESSMENT Daniel Cox is a 67 y.o. male presenting with right hemiparesis, expressive aphasia upon completion of  cardiac catherization which ultimately improved. Imaging confirms a small left posterior insula/external capsule acute infarct. There may be several other punctate acute infarcts elsewhere in the posterior left MCA territory, Infarct felt to be embolic secondary to event.  On no antithrombotics prior to admission. Now on baby aspirin 81mg  daily for secondary stroke prevention. Patient with resultant right hemiparesis, expressive aphasia, improved. Work up underway.   LDL 77  HGB  A1C  5.9  Severe coronary artery disease  Chronic kidney disease  Erectile dysfunction  Cervical/lumbar degenerative disease  Long term medical use  Hospital day # 3  TREATMENT/PLAN  Continue aspirin 81 mg orally every day for secondary stroke prevention and if the patient has a cardiac indication add plavix, Patient is scheduled for Percutaneous intervention for Monday. I have discussed with Dr. Pearlean Brownie and he has agreed that with severe coronary disease,  the plan would be to proceed with utilization of plavix plus aspirin.  Risk factor modification  Echo and carotids currently pending  Gwendolyn Lima. Manson Passey, [facility], MBA, MHA [facility] Pager: [phone] 02/07/2013 8:25 AM  I have personally obtained a history, examined the patient, evaluated imaging results, and formulated the assessment and plan of care. I agree with the above. Delia Heady, MD

## 2013-02-07 NOTE — Progress Notes (Signed)
Echocardiogram 2D Echocardiogram has been performed.  Daniel Cox 02/07/2013, 3:44 PM

## 2013-02-07 NOTE — Progress Notes (Addendum)
Patient Name: Daniel Cox Date of Encounter: 02/07/2013    SUBJECTIVE: Daniel Cox is doing well this morning. He had a terrible day yesterday as there was embolic injury to the left brain during his diagnostic catheterization procedure. Code stroke was called towards the end of the cath procedure he was found to have left middle cerebral artery distribution injury compatible with embolization. Luckily there is no significant motor abnormality. A dense speech abnormality initially present has significantly improved. His wife tells me that he cannot remember some things and that speech is not perfect. He rested well last night. He had no chest discomfort.  He was seen by TCTS and felt a better percutaneous candidate than coronary bypass candidate.  He did not receive TPA as suggested and Dr. Orvan July note. The patient had spontaneous significant resolution of symptoms.  He has not had any anginal pain since admission  TELEMETRY:  Sinus bradycardia: Filed Vitals:   02/06/13 1600 02/06/13 1700 02/06/13 1952 02/07/13 0422  BP: 115/86 123/88 111/69 111/69  Pulse: 77 82 50 70  Temp:   98.3 F (36.8 C) 98.7 F (37.1 C)  TempSrc:   Oral Oral  Resp: 18 18 18 18   Height:      Weight:      SpO2: 99% 99% 100%     Intake/Output Summary (Last 24 hours) at 02/07/13 0846 Last data filed at 02/07/13 0730  Gross per 24 hour  Intake    480 ml  Output   3175 ml  Net  -2695 ml    LABS: Basic Metabolic Panel:  Recent Labs  78/29/56 0435 02/06/13 1010  NA 137 134*  K 3.8 4.4  CL 107 104  CO2 24 23  GLUCOSE 99 84  BUN 15 13  CREATININE 1.25 1.24  CALCIUM 8.6 8.3*   CBC:  Recent Labs  02/06/13 0435 02/06/13 1010 02/07/13 0450  WBC 5.3 4.7 6.5  NEUTROABS  --  1.5*  --   HGB 11.9* 12.3* 12.0*  HCT 34.7* 35.3* 34.1*  MCV 95.3 94.6 94.5  PLT 176 151 167   Cardiac Enzymes:  Recent Labs  02/04/13 2016 02/05/13 0030 02/05/13 0730 02/06/13 1017  CKTOTAL  --   --   --  120   CKMB  --   --   --  1.7  TROPONINI 0.69* 0.56* 0.33*  --     Hemoglobin A1C:  Recent Labs  02/06/13 1010  HGBA1C 5.9*   Fasting Lipid Panel:  Recent Labs  02/07/13 0458  CHOL 128  HDL 35*  LDLCALC 77  TRIG 80  CHOLHDL 3.7    Radiology/Studies:  see MRI reports IMPRESSION:  1. Small left posterior insula/external capsule acute infarct.  There may be several other punctate acute infarcts elsewhere in the  posterior left MCA territory.  2. Due to Code stroke TPA considerations, the study was truncated.  3. See MRA findings below. MRA IMPRESSION:  1. Focal segment of non visualized flow in a left posterior MCA M2  branch as detailed above.  2. Otherwise the major left MCA branches, and the anterior  circulation, are within normal limits.  3. Negative posterior circulation.   Physical Exam: Blood pressure 111/69, pulse 70, temperature 98.7 F (37.1 C), temperature source Oral, resp. rate 18, height 5\' 8"  (1.727 m), weight 79.1 kg (174 lb 6.1 oz), SpO2 100.00%. Weight change:    No gallop, rub, or murmur.  Chest is clear  Extremities no edema.  Right femoral unremarkable.  Neuro reveals no obvious motor deficit. Facial asymmetry  has improved. No obvious speech impairment to my exam.  ASSESSMENT:  1. Acute coronary syndrome with severe LAD disease and moderately severe three-vessel coronary disease. The patient has been seen by TCTS and recommended for PCI of LAD rather than complete revascularization with CABG.  2. 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 Hypertension  4. Dilated aortic root  Plan:  1. Need to determine from neurology when it would be safe to perform LAD stent with accompanying procedural anticoagulation (Angiomax or heparin and dual antiplatelet therapy). He is unable to leave the hospital until LAD is intervened upon due to the severity of disease noted at cath.  2. Continue IV heparin. Start plavix if neuro  allows.  3. Continue IV nitroglycerin  4. 2-D echocardiogram today to assess aortic root size  5. We'll tentatively post for PCI on Monday if we get approval from neurology.  6. Long discussion concerning the cath complication with the patient and his wife. Described the remaining procedure and anticipated risks with recurrent stroke being a possibility. Also openly discussed and offered to help the patient find another physician to perform this procedure if he felt uncomfortable with me. All questions answered. Informed patient he would not be able to leave the hospital until LAD is treated. Selinda Eon 02/07/2013, 8:46 AM

## 2013-02-07 NOTE — Progress Notes (Signed)
ANTICOAGULATION CONSULT NOTE - Follow Up Consult  Pharmacy Consult for Heparin Indication: 3-vessel CAD and embolic stroke  Allergies  Allergen Reactions  . Penicillins Hives, Itching and Rash    Patient Measurements: Height: 5\' 8"  (172.7 cm) Weight: 174 lb 6.1 oz (79.1 kg) IBW/kg (Calculated) : 68.4 Heparin Dosing Weight: 79.1 kg  Vital Signs: Temp: 98.2 F (36.8 C) (06/13 1300) Temp src: Oral (06/13 1252) BP: 128/75 mmHg (06/13 1252) Pulse Rate: 56 (06/13 1252)  Labs:  Recent Labs  02/04/13 2016  02/05/13 0030 02/05/13 0730  02/06/13 0435 02/06/13 0730 02/06/13 1010 02/06/13 1017 02/07/13 0450 02/07/13 0804 02/07/13 1208  HGB  --   --   --   --   --  11.9*  --  12.3*  --  12.0*  --   --   HCT  --   --   --   --   --  34.7*  --  35.3*  --  34.1*  --   --   PLT  --   --   --   --   --  176  --  151  --  167  --   --   APTT  --   --   --   --   --   --   --  38*  --   --   --   --   LABPROT  --   --   --   --   --   --  13.1 13.5  --   --   --   --   INR  --   --   --   --   --   --  1.00 1.04  --   --   --   --   HEPARINUNFRC  --   < >  --   --   < > 0.47  --  0.13*  --  0.19*  --  0.43  CREATININE  --   --  1.51*  --   --  1.25  --  1.24  --   --  1.27  --   CKTOTAL  --   --   --   --   --   --   --   --  120  --   --   --   CKMB  --   --   --   --   --   --   --   --  1.7  --   --   --   TROPONINI 0.69*  --  0.56* 0.33*  --   --   --   --   --   --   --   --   < > = values in this interval not displayed.  Estimated Creatinine Clearance: 56.1 ml/min (by C-G formula based on Cr of 1.27).  Assessment:   Heparin level is now at goal (0.43) on 1050 units/hr.   CBC has trended down some. No bleeding noted.  Embolic stroke during cardiac cath on 6/12, no tPA given. Stroke symptoms noted to have resolved.  Plavix to be added when ok with Neuro. 3-vessel CAD, for PCI of LAD when cleared by Neuro, tentatively Monday, June 16th.     Goal of Therapy:  Heparin level  0.3-0.5 units/ml, lower end of therapeutic range Monitor platelets by anticoagulation protocol: Yes   Plan:   Continue heparin drip at 1050 units/hr.  Daily heparin level  and CBC while on heparin.  Low-therapeutic heparin level goal due to stroke.  Dennie Fetters, [place] Pager: [phone] 02/07/2013,2:22 PM

## 2013-02-07 NOTE — Progress Notes (Signed)
ANTICOAGULATION CONSULT NOTE - Follow Up Consult  Pharmacy Consult for Heparin Indication: chest pain/ACS  Allergies  Allergen Reactions  . Penicillins Hives, Itching and Rash    Patient Measurements: Height: 5\' 8"  (172.7 cm) Weight: 174 lb 6.1 oz (79.1 kg) IBW/kg (Calculated) : 68.4 Heparin Dosing Weight: 76.7 kg  Vital Signs: Temp: 98.7 F (37.1 C) (06/13 0422) Temp src: Oral (06/13 0422) BP: 111/69 mmHg (06/13 0422) Pulse Rate: 70 (06/13 0422)  Labs:  Recent Labs  02/04/13 1447  02/04/13 2016  02/05/13 0030 02/05/13 0730  02/06/13 0435 02/06/13 0730 02/06/13 1010 02/06/13 1017 02/07/13 0450  HGB 14.1  --   --   --   --   --   --  11.9*  --  12.3*  --   --   HCT 41.1  --   --   --   --   --   --  34.7*  --  35.3*  --   --   PLT 205  --   --   --   --   --   --  176  --  151  --   --   APTT  --   --   --   --   --   --   --   --   --  38*  --   --   LABPROT  --   --   --   --   --   --   --   --  13.1 13.5  --   --   INR  --   --   --   --   --   --   --   --  1.00 1.04  --   --   HEPARINUNFRC  --   --   --   < >  --   --   < > 0.47  --  0.13*  --  0.19*  CREATININE 1.43*  < >  --   --  1.51*  --   --  1.25  --  1.24  --   --   CKTOTAL  --   --   --   --   --   --   --   --   --   --  120  --   CKMB  --   --   --   --   --   --   --   --   --   --  1.7  --   TROPONINI  --   --  0.69*  --  0.56* 0.33*  --   --   --   --   --   --   < > = values in this interval not displayed.  Estimated Creatinine Clearance: 57.5 ml/min (by C-G formula based on Cr of 1.24).   Medications:  Scheduled:  . aspirin EC  81 mg Oral Daily  . atorvastatin  40 mg Oral q1800  . multivitamin with minerals  1 tablet Oral Daily   Infusions:  . heparin 900 Units/hr (02/06/13 2026)  . nitroGLYCERIN 10 mcg/min (02/06/13 1506)    Assessment: 67 yo M admitted with CP/ACS and elevated troponin.  Pt is s/p cath 6/12 which revealed multivessel disease. TCTS favors PCI stenting if  possible.  Noted cardiac cath was complicated by acute stroke with R hemiplegia and aphasia.  tPA was ordered but not given as patient had rapidly resolving  symptoms.  In light of this, patient will have a reduced heparin level goal.  Goal of Therapy:  Heparin level 0.3-0.5 units/ml Monitor platelets by anticoagulation protocol: Yes   Plan:  1) Increase heparin to 1050 units/hr. Marland Kitchen 2) Recheck heparin level in 6 hours.  Christoper Fabian, PharmD, BCPS Clinical pharmacist, pager [phone] 02/07/2013 5:29 AM

## 2013-02-08 LAB — CBC
MCH: 33.3 pg (ref 26.0–34.0)
Platelets: 174 10*3/uL (ref 150–400)
RBC: 3.84 MIL/uL — ABNORMAL LOW (ref 4.22–5.81)
RDW: 12.3 % (ref 11.5–15.5)
WBC: 6.7 10*3/uL (ref 4.0–10.5)

## 2013-02-08 LAB — HEPARIN LEVEL (UNFRACTIONATED): Heparin Unfractionated: 0.56 IU/mL (ref 0.30–0.70)

## 2013-02-08 MED ORDER — HEPARIN (PORCINE) IN NACL 100-0.45 UNIT/ML-% IJ SOLN
850.0000 [IU]/h | INTRAMUSCULAR | Status: DC
  Administered 2013-02-08 – 2013-02-10 (×2): 850 [IU]/h via INTRAVENOUS
  Filled 2013-02-08 (×3): qty 250

## 2013-02-08 NOTE — Progress Notes (Signed)
Subjective:  Only c/o is headache.  No c/o chest pain or SOB. Neuro note read.  Objective:  Vital Signs in the last 24 hours: BP 107/69  Pulse 52  Temp(Src) 98.1 F (36.7 C) (Oral)  Resp 17  Ht 5\' 8"  (1.727 m)  Wt 75.796 kg (167 lb 1.6 oz)  BMI 25.41 kg/m2  SpO2 95%  Physical Exam: Pleasant BM in NAD Lungs:  Clear  Cardiac:  Regular rhythm, normal S1 and S2, no S3 Extremities:  No edema present Neuro:  Mild expressive aphasia  Intake/Output from previous day: 06/13 0701 - 06/14 0700 In: 1080 [P.O.:1080] Out: 1200 [Urine:1200] Weight Filed Weights   02/04/13 1905 02/06/13 0430 02/08/13 0447  Weight: 76.93 kg (169 lb 9.6 oz) 79.1 kg (174 lb 6.1 oz) 75.796 kg (167 lb 1.6 oz)    Lab Results: Basic Metabolic Panel:  Recent Labs  16/10/96 1010 02/07/13 0804  NA 134* 135  K 4.4 3.9  CL 104 103  CO2 23 23  GLUCOSE 84 137*  BUN 13 11  CREATININE 1.24 1.27    CBC:  Recent Labs  02/06/13 0435 02/06/13 1010 02/07/13 0450 02/08/13 0425  WBC 5.3 4.7 6.5 6.7  NEUTROABS  --  1.5*  --   --   HGB 11.9* 12.3* 12.0* 12.8*  HCT 34.7* 35.3* 34.1* 36.1*  MCV 95.3 94.6 94.5 94.0  PLT 176 151 167 174    BNP    Component Value Date/Time   PROBNP 344.1* 02/04/2013 2016   Telemetry: Sinus bradycardia  Assessment/Plan:  1. Severe CAD with culprit LAD lesion 2. Acute stroke complicating cardiac cath embolic with mild residual expressive aphasia 3. CKD  Rec:  Neuro says OK to proceed with PCI.   I will go ahead and add Plavix.     Darden Palmer  MD [facility]  02/08/2013, 10:24 AM

## 2013-02-08 NOTE — Progress Notes (Signed)
Stroke Team Progress Note  HISTORY Daniel Cox is an 67 y.o. male who was getting a cardiac catheterization today when he suffered sudden onset right hemiplegia, expressive and receptive aphasia. At time of arrival patients hemiplegia was resolving but continued to show aphasia.receptive > expressive. Post CT scan, while waiting for coag studies patients speech improved and he was able to tell us his age, month, year, name objects and follow commands. In addition his right arm strength significantly improved.   Date last known well: 6.12.14  Time last known well: 08.42  NIHSS--initial 5 but end result was NIHSS 0  tPA Given: No: resolution of symptoms   SUBJECTIVE  Symptoms resolving. Feels better.  OBJECTIVE Most recent Vital Signs: Filed Vitals:   02/07/13 1252 02/07/13 1300 02/07/13 2023 02/08/13 0447  BP: 128/75  120/71 107/69  Pulse: 56  64 52  Temp: 98.7 F (37.1 C) 98.2 F (36.8 C) 98.9 F (37.2 C) 98.1 F (36.7 C)  TempSrc: Oral  Oral Oral  Resp: 18  18 17   Height:      Weight:    167 lb 1.6 oz (75.796 kg)  SpO2: 100%  95% 95%   CBG (last 3)   Recent Labs  02/06/13 0945  GLUCAP 85    IV Fluid Intake:   . heparin 950 Units/hr (02/08/13 0823)  . nitroGLYCERIN 10 mcg/min (02/06/13 1506)    MEDICATIONS  . aspirin EC  81 mg Oral Daily  . atorvastatin  40 mg Oral q1800  . clopidogrel  75 mg Oral Q breakfast  . metoprolol tartrate  12.5 mg Oral BID  . multivitamin with minerals  1 tablet Oral Daily   PRN:  acetaminophen, cyclobenzaprine, HYDROcodone-acetaminophen, methocarbamol, nitroGLYCERIN, ondansetron (ZOFRAN) IV, oxyCODONE-acetaminophen, zolpidem  Diet:  Cardiac thin liquids Activity:  Up with assistance DVT Prophylaxis:  scd  CLINICALLY SIGNIFICANT STUDIES Basic Metabolic Panel:   Recent Labs Lab 02/06/13 1010 02/07/13 0804  NA 134* 135  K 4.4 3.9  CL 104 103  CO2 23 23  GLUCOSE 84 137*  BUN 13 11  CREATININE 1.24 1.27  CALCIUM 8.3*  8.6   Liver Function Tests:   Recent Labs Lab 02/04/13 1955 02/06/13 1010  AST 25 21  ALT 17 15  ALKPHOS 87 84  BILITOT 0.4 0.3  PROT 6.7 6.3  ALBUMIN 3.6 3.2*   CBC:   Recent Labs Lab 02/06/13 0435 02/06/13 1010 02/07/13 0450 02/08/13 0425  WBC 5.3 4.7 6.5 6.7  NEUTROABS  --  1.5*  --   --   HGB 11.9* 12.3* 12.0* 12.8*  HCT 34.7* 35.3* 34.1* 36.1*  MCV 95.3 94.6 94.5 94.0  PLT 176 151 167 174   Coagulation:   Recent Labs Lab 02/06/13 0730 02/06/13 1010  LABPROT 13.1 13.5  INR 1.00 1.04   Cardiac Enzymes:   Recent Labs Lab 02/04/13 2016 02/05/13 0030 02/05/13 0730 02/06/13 1017  CKTOTAL  --   --   --  120  CKMB  --   --   --  1.7  TROPONINI 0.69* 0.56* 0.33*  --    Urinalysis: No results found for this basename: COLORURINE, APPERANCEUR, LABSPEC, PHURINE, GLUCOSEU, HGBUR, BILIRUBINUR, KETONESUR, PROTEINUR, UROBILINOGEN, NITRITE, LEUKOCYTESUR,  in the last 168 hours Lipid Panel    Component Value Date/Time   CHOL 128 02/07/2013 0458   TRIG 80 02/07/2013 0458   HDL 35* 02/07/2013 0458   CHOLHDL 3.7 02/07/2013 0458   VLDL 16 02/07/2013 0458   LDLCALC 77 02/07/2013  0458   HgbA1C  Lab Results  Component Value Date   HGBA1C 5.9* 02/06/2013    Urine Drug Screen:   No results found for this basename: labopia,  cocainscrnur,  labbenz,  amphetmu,  thcu,  labbarb    Alcohol Level: No results found for this basename: ETH,  in the last 168 hours  Ct Head Wo Contrast 02/06/2013  Mild atrophy without acute intracranial abnormality. Critical Value/emergent results were called by telephone at the time of interpretation on 02/06/2013 at 1016 hours to P A Felicie Morn, who verbally acknowledged these results.  High attenuation of the intravascular compartment consistent with recent catheterization and contrast administration.   Original Report Authenticated By: Andreas Newport, M.D.   Mr [facility] Wo Contrast 02/06/2013  Limited MRI Head Wo Small left posterior  insula/external capsule acute infarct. There may be several other punctate acute infarcts elsewhere in the posterior left MCA territory. 2.  Due to Code stroke TPA considerations, the study was truncated. 3.  See MRA findings below.   MRA HEAD WITHOUT  1.  Focal segment of non visualized flow in a left posterior MCA M2 branch as detailed above. 2.  Otherwise the major left MCA branches, and the anterior circulation, are within normal limits. 3.  Negative posterior circulation.    2D Echocardiogram  Done, results pending  Carotid Doppler    CXR  Stable exam, no active disease  EKG  normal sinus rhythm.   Therapy Recommendations No PT follow up.    Physical Exam  General: The patient is alert and cooperative at the time of the examination.  Skin: No significant peripheral edema is noted.   Neurologic Exam  Cranial nerves: Facial symmetry is present. Speech is notable for mild aphasia. The patient has some difficulty understanding verbal commands. Extraocular movements are full. Visual fields are notable for a relative right homonymous visual field deficit.  Motor: The patient has good strength in all 4 extremities.  Coordination: The patient has good finger-nose-finger and heel-to-shin bilaterally. Some apraxia is noted.  Gait and station: The gait was not tested.  Reflexes: Deep tendon reflexes are symmetric.    ASSESSMENT Mr. Daniel Cox is a 67 y.o. male presenting with right hemiparesis, expressive aphasia upon completion of cardiac catherization which ultimately improved. Imaging confirms a small left posterior insula/external capsule acute infarct. There may be several other punctate acute infarcts elsewhere in the posterior left MCA territory, Infarct felt to be embolic secondary to event.  On no antithrombotics prior to admission. Now on baby aspirin 81mg  daily for secondary stroke prevention. Patient with resultant right hemiparesis, expressive aphasia, improved. Work  up underway.   LDL 77  HGB  A1C  5.9  Severe coronary artery disease  Chronic kidney disease  Erectile dysfunction  Cervical/lumbar degenerative disease  Long term medical use  Hospital day # 4  TREATMENT/PLAN  Continue aspirin 81 mg orally every day for secondary stroke prevention and if the patient has a cardiac indication add plavix, Patient is scheduled for Percutaneous intervention for Monday. I have discussed with Dr. Pearlean Brownie and he has agreed that with severe coronary disease, the plan would be to proceed with utilization of plavix plus aspirin.  Risk factor modification  Echo and carotids currently pending  WILLIS,CHARLES KEITH  02/08/2013 10:10 AM

## 2013-02-08 NOTE — Progress Notes (Signed)
Discontinued NTG drip per order to d/c post cath in am. Daniel Cox

## 2013-02-08 NOTE — Progress Notes (Signed)
ANTICOAGULATION CONSULT NOTE - Follow Up Consult  Pharmacy Consult for Heparin Indication: chest pain/ACS  Allergies  Allergen Reactions  . Penicillins Hives, Itching and Rash    Patient Measurements: Height: 5\' 8"  (172.7 cm) Weight: 167 lb 1.6 oz (75.796 kg) IBW/kg (Calculated) : 68.4 Heparin Dosing Weight: 76.7 kg  Vital Signs: Temp: 98 F (36.7 C) (06/14 1347) Temp src: Oral (06/14 1347) BP: 115/71 mmHg (06/14 1347) Pulse Rate: 57 (06/14 1347)  Labs:  Recent Labs  02/06/13 0435 02/06/13 0730 02/06/13 1010 02/06/13 1017 02/07/13 0450 02/07/13 0804 02/07/13 1208 02/08/13 0425 02/08/13 1409  HGB 11.9*  --  12.3*  --  12.0*  --   --  12.8*  --   HCT 34.7*  --  35.3*  --  34.1*  --   --  36.1*  --   PLT 176  --  151  --  167  --   --  174  --   APTT  --   --  38*  --   --   --   --   --   --   LABPROT  --  13.1 13.5  --   --   --   --   --   --   INR  --  1.00 1.04  --   --   --   --   --   --   HEPARINUNFRC 0.47  --  0.13*  --  0.19*  --  0.43 0.64 0.56  CREATININE 1.25  --  1.24  --   --  1.27  --   --   --   CKTOTAL  --   --   --  120  --   --   --   --   --   CKMB  --   --   --  1.7  --   --   --   --   --     Estimated Creatinine Clearance: 56.1 ml/min (by C-G formula based on Cr of 1.27).   Medications:  Scheduled:  . aspirin EC  81 mg Oral Daily  . atorvastatin  40 mg Oral q1800  . clopidogrel  75 mg Oral Q breakfast  . metoprolol tartrate  12.5 mg Oral BID  . multivitamin with minerals  1 tablet Oral Daily   Infusions:  . heparin 950 Units/hr (02/08/13 0823)  . nitroGLYCERIN 10 mcg/min (02/06/13 1506)    Assessment: 67 yo M with CP x 5 days, pain in back & across shoulders x 2 days; seen by PCP, T-wave inversions on EKG. Patient now with new CVA post-cath and HL = 0.56 slightly above goal on 950 units/hr. Patient noted for PCI on Monday.  Goal of Therapy:  Heparin level 0.3-0.5 units/ml Monitor platelets by anticoagulation protocol: Yes    Plan:  -Decrease heparin to 850 units/hr -Recheck a heparin level in am  Harland German, Pharm D 02/08/2013 3:19 PM

## 2013-02-08 NOTE — Progress Notes (Signed)
ANTICOAGULATION CONSULT NOTE - Follow Up Consult  Pharmacy Consult for Heparin Indication: chest pain/ACS  Allergies  Allergen Reactions  . Penicillins Hives, Itching and Rash    Patient Measurements: Height: 5\' 8"  (172.7 cm) Weight: 167 lb 1.6 oz (75.796 kg) IBW/kg (Calculated) : 68.4 Heparin Dosing Weight: 76.7 kg  Vital Signs: Temp: 98.1 F (36.7 C) (06/14 0447) Temp src: Oral (06/14 0447) BP: 107/69 mmHg (06/14 0447) Pulse Rate: 52 (06/14 0447)  Labs:  Recent Labs  02/06/13 0435 02/06/13 0730 02/06/13 1010 02/06/13 1017 02/07/13 0450 02/07/13 0804 02/07/13 1208 02/08/13 0425  HGB 11.9*  --  12.3*  --  12.0*  --   --  12.8*  HCT 34.7*  --  35.3*  --  34.1*  --   --  36.1*  PLT 176  --  151  --  167  --   --  174  APTT  --   --  38*  --   --   --   --   --   LABPROT  --  13.1 13.5  --   --   --   --   --   INR  --  1.00 1.04  --   --   --   --   --   HEPARINUNFRC 0.47  --  0.13*  --  0.19*  --  0.43 0.64  CREATININE 1.25  --  1.24  --   --  1.27  --   --   CKTOTAL  --   --   --  120  --   --   --   --   CKMB  --   --   --  1.7  --   --   --   --     Estimated Creatinine Clearance: 56.1 ml/min (by C-G formula based on Cr of 1.27).   Medications:  Scheduled:  . aspirin EC  81 mg Oral Daily  . atorvastatin  40 mg Oral q1800  . clopidogrel  75 mg Oral Q breakfast  . metoprolol tartrate  12.5 mg Oral BID  . multivitamin with minerals  1 tablet Oral Daily   Infusions:  . heparin 1,050 Units/hr (02/07/13 0541)  . nitroGLYCERIN 10 mcg/min (02/06/13 1506)    Assessment: 67 yo M with CP x 5 days, pain in back & across shoulders x 2 days; seen by PCP, T-wave inversions on EKG.  Anticoagulation -Had new CVA post-cath. HL 0.64 slightly > goal this am. CBC ok.  Cardiovascular - HLD, Cath=Severe 3V CAD, incl LAD. TCTS consulted. Favors PCI of LAD when Neuro feels it's safe for Angiomax and DAPT, tentatively Monday 6/16. VSS On ASA 81, Lipitor 40, Plavix, Metop,  IV NTG. Echo pending  Neurology - new CVA during cath (embolic L MCA infarct)- now on ASA 81, statin. Symptoms resolved   Goal of Therapy:  Heparin level 0.3-0.5 units/ml Monitor platelets by anticoagulation protocol: Yes   Plan:  F/u Echo Decrease heparin to 950 uts/hr and recheck in 6 hrs.   Rose Hegner S. Merilynn Finland, PharmD, [facility] Clinical Staff Pharmacist Pager [phone]  02/08/2013 8:12 AM

## 2013-02-09 LAB — CBC
MCH: 33.2 pg (ref 26.0–34.0)
MCV: 93.9 fL (ref 78.0–100.0)
Platelets: 174 10*3/uL (ref 150–400)
RBC: 3.92 MIL/uL — ABNORMAL LOW (ref 4.22–5.81)
RDW: 12.2 % (ref 11.5–15.5)
WBC: 6.1 10*3/uL (ref 4.0–10.5)

## 2013-02-09 LAB — BASIC METABOLIC PANEL
CO2: 25 mEq/L (ref 19–32)
Calcium: 9.3 mg/dL (ref 8.4–10.5)
Creatinine, Ser: 1.34 mg/dL (ref 0.50–1.35)
GFR calc Af Amer: 63 mL/min — ABNORMAL LOW (ref >90)
Sodium: 135 mEq/L (ref 135–145)

## 2013-02-09 MED ORDER — SODIUM CHLORIDE 0.9 % IV SOLN
1.0000 mL/kg/h | INTRAVENOUS | Status: DC
  Administered 2013-02-10: 1 mL/kg/h via INTRAVENOUS

## 2013-02-09 MED ORDER — DIAZEPAM 5 MG PO TABS
5.0000 mg | ORAL_TABLET | ORAL | Status: AC
  Administered 2013-02-10: 5 mg via ORAL
  Filled 2013-02-09: qty 1

## 2013-02-09 MED ORDER — SODIUM CHLORIDE 0.9 % IJ SOLN: 3.0000 mL | INTRAMUSCULAR | Status: DC | PRN

## 2013-02-09 MED ORDER — SODIUM CHLORIDE 0.9 % IV SOLN
1.0000 mL/kg/h | INTRAVENOUS | Status: DC
  Administered 2013-02-09: 1 mL/kg/h via INTRAVENOUS

## 2013-02-09 MED ORDER — CLOPIDOGREL BISULFATE 300 MG PO TABS
300.0000 mg | ORAL_TABLET | ORAL | Status: AC
  Administered 2013-02-10: 300 mg via ORAL
  Filled 2013-02-09 (×2): qty 1

## 2013-02-09 MED ORDER — ASPIRIN 81 MG PO CHEW
324.0000 mg | CHEWABLE_TABLET | ORAL | Status: AC
  Administered 2013-02-10: 324 mg via ORAL
  Filled 2013-02-09: qty 4

## 2013-02-09 MED ORDER — SODIUM CHLORIDE 0.9 % IV SOLN: 250.0000 mL | INTRAVENOUS | Status: DC | PRN

## 2013-02-09 MED ORDER — SODIUM CHLORIDE 0.9 % IJ SOLN: 3.0000 mL | Freq: Two times a day (BID) | INTRAMUSCULAR | Status: DC

## 2013-02-09 NOTE — H&P (Signed)
The patient and family gathered and we discussed the PCI of the LAD. We discussed the higher than usual risk of ischemic complication due to a [place] 111 bifurcation. I estimated the risk of side branch occlusion to be greater than 20%. I explained in detail that this may result in "heart attack". Heart attack was explained as part of the heart muscle dying. We additionally discussed the risk of recurrent stroke, bleeding intracranially due to intense anticoagulation, myocardial infarction, emergency surgery, death, limb ischemia, bleeding, allergy, and kidney failure.  Multiple questions were asked. All questions were answered. The patient was again directly asked if he felt comfortable allowing me to perform the procedure. He had no reservations about moving forward with the procedure. The family was also in agreement. The procedures planned to be performed tomorrow morning at around 9:30 AM.

## 2013-02-09 NOTE — Progress Notes (Addendum)
Stroke Team Progress Note  HISTORY Daniel Cox is an 67 y.o. male who was getting a cardiac catheterization today when he suffered sudden onset right hemiplegia, expressive and receptive aphasia. At time of arrival patients hemiplegia was resolving but continued to show aphasia.receptive > expressive. Post CT scan, while waiting for coag studies patients speech improved and he was able to tell us his age, month, year, name objects and follow commands. In addition his right arm strength significantly improved.   Date last known well: 6.12.14  Time last known well: 08.42  NIHSS--initial 5 but end result was NIHSS 0  tPA Given: No: resolution of symptoms   SUBJECTIVE  Symptoms resolving. Feels better.  OBJECTIVE Most recent Vital Signs: Filed Vitals:   02/08/13 1347 02/08/13 1955 02/08/13 2159 02/09/13 0507  BP: 115/71 121/76  107/73  Pulse: 57 59 67 53  Temp: 98 F (36.7 C) 98.5 F (36.9 C)  98.3 F (36.8 C)  TempSrc: Oral Oral  Oral  Resp: 18 18  16   Height:      Weight:    165 lb 11.2 oz (75.161 kg)  SpO2: 97% 95%  94%   CBG (last 3)  No results found for this basename: GLUCAP,  in the last 72 hours  IV Fluid Intake:   . heparin 850 Units/hr (02/08/13 1948)    MEDICATIONS  . aspirin EC  81 mg Oral Daily  . atorvastatin  40 mg Oral q1800  . clopidogrel  75 mg Oral Q breakfast  . metoprolol tartrate  12.5 mg Oral BID  . multivitamin with minerals  1 tablet Oral Daily   PRN:  acetaminophen, cyclobenzaprine, HYDROcodone-acetaminophen, methocarbamol, nitroGLYCERIN, ondansetron (ZOFRAN) IV, oxyCODONE-acetaminophen, zolpidem  Diet:  Cardiac thin liquids Activity:  Up with assistance DVT Prophylaxis:  scd  CLINICALLY SIGNIFICANT STUDIES Basic Metabolic Panel:   Recent Labs Lab 02/06/13 1010 02/07/13 0804  NA 134* 135  K 4.4 3.9  CL 104 103  CO2 23 23  GLUCOSE 84 137*  BUN 13 11  CREATININE 1.24 1.27  CALCIUM 8.3* 8.6   Liver Function Tests:   Recent  Labs Lab 02/04/13 1955 02/06/13 1010  AST 25 21  ALT 17 15  ALKPHOS 87 84  BILITOT 0.4 0.3  PROT 6.7 6.3  ALBUMIN 3.6 3.2*   CBC:   Recent Labs Lab 02/06/13 0435 02/06/13 1010  02/08/13 0425 02/09/13 0444  WBC 5.3 4.7  < > 6.7 6.1  NEUTROABS  --  1.5*  --   --   --   HGB 11.9* 12.3*  < > 12.8* 13.0  HCT 34.7* 35.3*  < > 36.1* 36.8*  MCV 95.3 94.6  < > 94.0 93.9  PLT 176 151  < > 174 174  < > = values in this interval not displayed. Coagulation:   Recent Labs Lab 02/06/13 0730 02/06/13 1010  LABPROT 13.1 13.5  INR 1.00 1.04   Cardiac Enzymes:   Recent Labs Lab 02/04/13 2016 02/05/13 0030 02/05/13 0730 02/06/13 1017  CKTOTAL  --   --   --  120  CKMB  --   --   --  1.7  TROPONINI 0.69* 0.56* 0.33*  --    Urinalysis: No results found for this basename: COLORURINE, APPERANCEUR, LABSPEC, PHURINE, GLUCOSEU, HGBUR, BILIRUBINUR, KETONESUR, PROTEINUR, UROBILINOGEN, NITRITE, LEUKOCYTESUR,  in the last 168 hours Lipid Panel    Component Value Date/Time   CHOL 128 02/07/2013 0458   TRIG 80 02/07/2013 0458   HDL 35*  02/07/2013 0458   CHOLHDL 3.7 02/07/2013 0458   VLDL 16 02/07/2013 0458   LDLCALC 77 02/07/2013 0458   HgbA1C  Lab Results  Component Value Date   HGBA1C 5.9* 02/06/2013    Urine Drug Screen:   No results found for this basename: labopia,  cocainscrnur,  labbenz,  amphetmu,  thcu,  labbarb    Alcohol Level: No results found for this basename: ETH,  in the last 168 hours  Ct Head Wo Contrast 02/06/2013  Mild atrophy without acute intracranial abnormality. Critical Value/emergent results were called by telephone at the time of interpretation on 02/06/2013 at 1016 hours to P A Felicie Morn, who verbally acknowledged these results.  High attenuation of the intravascular compartment consistent with recent catheterization and contrast administration.   Original Report Authenticated By: Andreas Newport, M.D.   Daniel Cox Wo Contrast 02/06/2013  Limited MRI Head  Wo Small left posterior insula/external capsule acute infarct. There may be several other punctate acute infarcts elsewhere in the posterior left MCA territory. 2.  Due to Code stroke TPA considerations, the study was truncated. 3.  See MRA findings below.   MRA HEAD WITHOUT  1.  Focal segment of non visualized flow in a left posterior MCA M2 branch as detailed above. 2.  Otherwise the major left MCA branches, and the anterior circulation, are within normal limits. 3.  Negative posterior circulation.    2D Echocardiogram Study Conclusions  - Left ventricle: The cavity size was normal. There was moderate concentric hypertrophy. Systolic function was normal. The estimated ejection fraction was in the range of 55% to 60%. Hypokinesis of the anteroseptal myocardium. - Aortic root: The aortic root was mildly dilated. Impressions:  - No cardiac source of emboli was indentified.   Carotid Doppler    CXR  Stable exam, no active disease  EKG  normal sinus rhythm.   Therapy Recommendations No PT follow up.    Physical Exam  General: The patient is alert and cooperative at the time of the examination.  Skin: No significant peripheral edema is noted.   Neurologic Exam  Cranial nerves: Facial symmetry is present. Speech is notable for mild aphasia. The patient has some difficulty understanding verbal commands. Extraocular movements are full. Visual fields are notable for a relative right homonymous visual field deficit.  Motor: The patient has good strength in all 4 extremities.  Coordination: The patient has good finger-nose-finger and heel-to-shin bilaterally. Some apraxia is noted.  Gait and station: The gait was not tested.  Reflexes: Deep tendon reflexes are symmetric.    ASSESSMENT Daniel Cox is a 67 y.o. male presenting with right hemiparesis, expressive aphasia upon completion of cardiac catherization which ultimately improved. Imaging confirms a small left  posterior insula/external capsule acute infarct. There may be several other punctate acute infarcts elsewhere in the posterior left MCA territory, Infarct felt to be embolic secondary to event.  On no antithrombotics prior to admission. Now on baby aspirin 81mg  daily for secondary stroke prevention. Patient with resultant right hemiparesis, expressive aphasia, improved. Work up underway.   LDL 77  HGB  A1C  5.9  Severe coronary artery disease  Chronic kidney disease  Erectile dysfunction  Cervical/lumbar degenerative disease  Long term medical use  Cox day # 5  TREATMENT/PLAN  Continue aspirin 81 mg orally every day for secondary stroke prevention and if the patient has a cardiac indication add plavix, Patient is scheduled for Percutaneous intervention for Monday.  The plan would be  to proceed with utilization of plavix plus aspirin.  Risk factor modification   carotids currently pending  Cardiology is following, possible LAD PCI in am  Bianca Raneri KEITH  02/09/2013 12:10 PM

## 2013-02-09 NOTE — Progress Notes (Signed)
ANTICOAGULATION CONSULT NOTE - Follow Up Consult  Pharmacy Consult for Heparin Indication: chest pain/ACS and stroke  Allergies  Allergen Reactions  . Penicillins Hives, Itching and Rash    Patient Measurements: Height: 5\' 8"  (172.7 cm) Weight: 165 lb 11.2 oz (75.161 kg) IBW/kg (Calculated) : 68.4 Heparin Dosing Weight: 75 kg  Vital Signs: Temp: 98.3 F (36.8 C) (06/15 0507) Temp src: Oral (06/15 0507) BP: 107/73 mmHg (06/15 0507) Pulse Rate: 53 (06/15 0507)  Labs:  Recent Labs  02/07/13 0450 02/07/13 0804  02/08/13 0425 02/08/13 1409 02/09/13 0444  HGB 12.0*  --   --  12.8*  --  13.0  HCT 34.1*  --   --  36.1*  --  36.8*  PLT 167  --   --  174  --  174  HEPARINUNFRC 0.19*  --   < > 0.64 0.56 0.34  CREATININE  --  1.27  --   --   --   --   < > = values in this interval not displayed.  Estimated Creatinine Clearance: 56.1 ml/min (by C-G formula based on Cr of 1.27).   Medications:  Scheduled:  . aspirin EC  81 mg Oral Daily  . atorvastatin  40 mg Oral q1800  . clopidogrel  75 mg Oral Q breakfast  . metoprolol tartrate  12.5 mg Oral BID  . multivitamin with minerals  1 tablet Oral Daily   Infusions:  . heparin 850 Units/hr (02/08/13 1948)    Assessment: 67 yo M s/p cath (complicated by CVA) with multivessel disease.  Pt continues on heparin for anticoagulation awaiting PCI (possibly Monday).  Heparin level is therapeutic on 850 units/hr.   Goal of Therapy:  Heparin level 0.3-0.5 units/ml Monitor platelets by anticoagulation protocol: Yes   Plan:  Continue heparin at 850 units/hr. Continue heparin level and CBC daily. Follow-up plans for PCI on Monday.  [employer], Pharm.D., BCPS Clinical Pharmacist Pager [phone] 02/09/2013 12:29 PM

## 2013-02-09 NOTE — Progress Notes (Signed)
Subjective:  Headache resolved.  No chest pain or SOB.   Objective:  Vital Signs in the last 24 hours: BP 107/73  Pulse 53  Temp(Src) 98.3 F (36.8 C) (Oral)  Resp 16  Ht 5\' 8"  (1.727 m)  Wt 75.161 kg (165 lb 11.2 oz)  BMI 25.2 kg/m2  SpO2 94%  Physical Exam: Pleasant BM in NAD Lungs:  Clear  Cardiac:  Regular rhythm, normal S1 and S2, no S3 Extremities:  No edema present Neuro:  Mild expressive aphasia  Intake/Output from previous day: 06/14 0701 - 06/15 0700 In: -  Out: 200 [Urine:200] Weight Filed Weights   02/06/13 0430 02/08/13 0447 02/09/13 0507  Weight: 79.1 kg (174 lb 6.1 oz) 75.796 kg (167 lb 1.6 oz) 75.161 kg (165 lb 11.2 oz)    Lab Results: Basic Metabolic Panel:  Recent Labs  16/10/96 0804  NA 135  K 3.9  CL 103  CO2 23  GLUCOSE 137*  BUN 11  CREATININE 1.27    CBC:  Recent Labs  02/08/13 0425 02/09/13 0444  WBC 6.7 6.1  HGB 12.8* 13.0  HCT 36.1* 36.8*  MCV 94.0 93.9  PLT 174 174    BNP    Component Value Date/Time   PROBNP 344.1* 02/04/2013 2016   Telemetry: Sinus bradycardia  Assessment/Plan:  1. Severe CAD with culprit LAD lesion 2. Acute stroke complicating cardiac cath embolic with mild residual expressive aphasia 3. CKD  Rec:  Daughter in room.  Discussed possible PCI of LAD tomorrow by Dr. Katrinka Blazing.  Will keep NPO after midnight and recheck renal function today.  Continue IV heparin.     Darden Palmer  MD [facility]  02/09/2013, 11:52 AM

## 2013-02-10 ENCOUNTER — Encounter (HOSPITAL_COMMUNITY)
Admission: EM | Disposition: A | Discharge status: Home / Self Care | Payer: Self-pay | Source: Home / Self Care | Attending: Interventional Cardiology

## 2013-02-10 DIAGNOSIS — I6350 Cerebral infarction due to unspecified occlusion or stenosis of unspecified cerebral artery: Secondary | ICD-10-CM

## 2013-02-10 HISTORY — PX: PERCUTANEOUS CORONARY STENT INTERVENTION (PCI-S): SHX5485

## 2013-02-10 LAB — PLATELET INHIBITION P2Y12: Platelet Function  P2Y12: 245 [PRU] (ref 194–418)

## 2013-02-10 LAB — BASIC METABOLIC PANEL
BUN: 13 mg/dL (ref 6–23)
Calcium: 9.2 mg/dL (ref 8.4–10.5)
Chloride: 103 mEq/L (ref 96–112)
Creatinine, Ser: 1.35 mg/dL (ref 0.50–1.35)
GFR calc Af Amer: 62 mL/min — ABNORMAL LOW (ref >90)

## 2013-02-10 LAB — CBC
HCT: 37.7 % — ABNORMAL LOW (ref 39.0–52.0)
MCH: 32.6 pg (ref 26.0–34.0)
MCV: 94.5 fL (ref 78.0–100.0)
Platelets: 183 10*3/uL (ref 150–400)
RDW: 12.3 % (ref 11.5–15.5)

## 2013-02-10 SURGERY — PERCUTANEOUS CORONARY STENT INTERVENTION (PCI-S)
Anesthesia: LOCAL

## 2013-02-10 MED ORDER — OXYCODONE-ACETAMINOPHEN 5-325 MG PO TABS: 1.0000 | ORAL_TABLET | ORAL | Status: DC | PRN

## 2013-02-10 MED ORDER — ACETAMINOPHEN 325 MG PO TABS: 650.0000 mg | ORAL_TABLET | ORAL | Status: DC | PRN

## 2013-02-10 MED ORDER — MIDAZOLAM HCL 2 MG/2ML IJ SOLN
INTRAMUSCULAR | Status: AC
  Filled 2013-02-10: qty 2

## 2013-02-10 MED ORDER — ONDANSETRON HCL 4 MG/2ML IJ SOLN: 4.0000 mg | Freq: Four times a day (QID) | INTRAMUSCULAR | Status: DC | PRN

## 2013-02-10 MED ORDER — CLOPIDOGREL BISULFATE 75 MG PO TABS
75.0000 mg | ORAL_TABLET | Freq: Every day | ORAL | Status: DC
  Filled 2013-02-10: qty 1

## 2013-02-10 MED ORDER — LIDOCAINE HCL (PF) 1 % IJ SOLN
INTRAMUSCULAR | Status: AC
  Filled 2013-02-10: qty 30

## 2013-02-10 MED ORDER — FENTANYL CITRATE 0.05 MG/ML IJ SOLN
INTRAMUSCULAR | Status: AC
  Filled 2013-02-10: qty 2

## 2013-02-10 MED ORDER — SODIUM CHLORIDE 0.9 % IV SOLN
1.0000 mL/kg/h | INTRAVENOUS | Status: AC
  Administered 2013-02-10: 1 mL/kg/h via INTRAVENOUS

## 2013-02-10 MED ORDER — HEPARIN (PORCINE) IN NACL 2-0.9 UNIT/ML-% IJ SOLN
INTRAMUSCULAR | Status: AC
  Filled 2013-02-10: qty 1000

## 2013-02-10 MED ORDER — BIVALIRUDIN 250 MG IV SOLR
INTRAVENOUS | Status: AC
  Filled 2013-02-10: qty 250

## 2013-02-10 MED ORDER — ASPIRIN EC 325 MG PO TBEC
325.0000 mg | DELAYED_RELEASE_TABLET | Freq: Every day | ORAL | Status: DC
  Filled 2013-02-10: qty 1

## 2013-02-10 MED ORDER — NITROGLYCERIN 0.2 MG/ML ON CALL CATH LAB
INTRAVENOUS | Status: AC
  Filled 2013-02-10: qty 1

## 2013-02-10 NOTE — Evaluation (Addendum)
Speech Language Pathology Evaluation Patient Details Name: Daniel Cox MRN: [id number] DOB: 01-Mar-1946 Today's Date: 02/10/2013 Time: 8469-6295 SLP Time Calculation (min): 23 min  Problem List:  Patient Active Problem List   Diagnosis Date Noted  . Cerebral embolism 02/07/2013    Class: Acute  . Acute coronary syndrome 02/04/2013    Class: Acute  . BPH (benign prostatic hypertrophy) 02/04/2013    Class: Chronic  . Chronic kidney disease (CKD), stage III (moderate) 02/04/2013  . Hyperlipidemia 02/04/2013  . Erectile dysfunction 02/04/2013    Class: Chronic   Past Medical History:  Past Medical History  Diagnosis Date  . Chronic kidney disease   . Hyperlipidemia   . Erectile dysfunction   . Degenerative disc disease, cervical   . Degenerative lumbar disc    Past Surgical History:  Past Surgical History  Procedure Laterality Date  . Right shoulder    . Appendectony     HPI:  Daniel Cox is an 67 y.o. male who was getting a cardiac catheterization when he suffered sudden onset right hemiplegia, expressive and receptive aphasia. Pt's hemiplegia was resolving but continued to show aphasia.receptive > expressive when neurology saw him.  Order for SLE received.    Pt MRI showed external capsule, left posterior insula CVA and ? other puncate CVAs in left MCA area.  Pt reports speech/language has improved but admits to occasional word finding difficulties.     Assessment / Plan / Recommendation Clinical Impression  Pt presents with minimal expressive language deficits - mild word finding difficulties evidenced by minimal pauses in communication x2 during 20 min session.  Pt was able to follow multistep commands, named 16 animals in 60 seconds, read sentence level material without dysarthria/apraxia.  Provided pt with compensation strategies for word finding deficits and exercises to maximize language rehabiliation.  Advised pt/spouse to have spouse check bills pt is responsible  for paying upon dc home to assure home management [facility].   Language deficits appear to have improved from neurological event.   No further SLP indicated as all education completed.  Thank you for this referral.       SLP Assessment  Patient does not need any further Speech Lanaguage Pathology Services    Follow Up Recommendations  None    Frequency and Duration        Pertinent Vitals/Pain Afebrile   SLP Goals   n/a  SLP Evaluation Prior Functioning  Cognitive/Linguistic Baseline: Within functional limits Type of Home: House Lives With: Spouse Available Help at Discharge: Family;Available 24 hours/day Education: [employer], worked for [employer]  Vocation: Retired   IT consultant  Overall Cognitive Status: Within Systems developer for tasks assessed Arousal/Alertness: Awake/alert Orientation Level: Oriented X4 Attention: Selective Selective Attention: Appears intact Memory: Appears intact Awareness: Appears intact Problem Solving: Appears intact Safety/Judgment: Appears intact    Comprehension  Auditory Comprehension Overall Auditory Comprehension: Appears within functional limits for tasks assessed Yes/No Questions: Not tested Commands: Within Functional Limits (multistep) Conversation: Complex Visual Recognition/Discrimination Discrimination: Within Function Limits Reading Comprehension Reading Status: Within funtional limits (read calendar, sentences)    Expression Expression Primary Mode of Expression: Verbal Verbal Expression Overall Verbal Expression: Appears within functional limits for tasks assessed Initiation: No impairment Level of Generative/Spontaneous Verbalization: Conversation Repetition:  (DNT) Naming: No impairment Pragmatics: No impairment Written Expression Dominant Hand: Right Written Expression: Not tested   Oral / Motor Oral Motor/Sensory Function Overall Oral Motor/Sensory Function: Appears within functional limits for tasks  assessed Motor Speech Overall  Motor Speech: Appears within functional limits for tasks assessed   GO     Daniel Burnet, MS [facility] SLP [phone]

## 2013-02-10 NOTE — Interval H&P Note (Signed)
Cath Lab Visit (complete for each Cath Lab visit)  Clinical Evaluation Leading to the Procedure:   ACS: yes  Non-ACS:    Anginal Classification: CCS IV  Anti-ischemic medical therapy: Maximal Therapy (2 or more classes of medications)  Non-Invasive Test Results: No non-invasive testing performed  Prior CABG: No previous CABG      History and Physical Interval Note:  02/10/2013 10:22 AM  Daniel Cox  has presented today for surgery, with the diagnosis of CAD  The various methods of treatment have been discussed with the patient and family. After consideration of risks, benefits and other options for treatment, the patient has consented to  Procedure(s): PERCUTANEOUS CORONARY STENT INTERVENTION (PCI-S) (N/A) as a surgical intervention .  The patient's history has been reviewed, patient examined, no change in status, stable for surgery.  I have reviewed the patient's chart and labs.  Questions were answered to the patient's satisfaction.     Lesleigh Noe

## 2013-02-10 NOTE — Progress Notes (Signed)
Stroke Team Progress Note  HISTORY Daniel Cox is an 67 y.o. male who was getting a cardiac catheterization today when he suffered sudden onset right hemiplegia, expressive and receptive aphasia. At time of arrival patients hemiplegia was resolving but continued to show aphasia.receptive > expressive. Post CT scan, while waiting for coag studies patients speech improved and he was able to tell us his age, month, year, name objects and follow commands. In addition his right arm strength significantly improved.   Date last known well: 6.12.14  Time last known well: 08.42  NIHSS--initial 5 but end result was NIHSS 0  tPA Given: No: resolution of symptoms   SUBJECTIVE Multiple family members at bedside. Awaiting procedure this am.   OBJECTIVE Most recent Vital Signs: Filed Vitals:   02/09/13 0507 02/09/13 1312 02/09/13 1947 02/10/13 0407  BP: 107/73 124/84 115/70 105/71  Pulse: 53 58 77 57  Temp: 98.3 F (36.8 C) 98.4 F (36.9 C) 98.7 F (37.1 C) 98.8 F (37.1 C)  TempSrc: Oral Oral Oral Oral  Resp: 16 18 19 20   Height:      Weight: 75.161 kg (165 lb 11.2 oz)   75.66 kg (166 lb 12.8 oz)  SpO2: 94% 97% 96% 94%   CBG (last 3)  No results found for this basename: GLUCAP,  in the last 72 hours  IV Fluid Intake:   . sodium chloride 1 mL/kg/hr (02/10/13 0535)  . sodium chloride 1 mL/kg/hr (02/09/13 2229)  . heparin 850 Units/hr (02/10/13 0123)   MEDICATIONS  . atorvastatin  40 mg Oral q1800  . [START ON 02/11/2013] clopidogrel  75 mg Oral Q breakfast  . diazepam  5 mg Oral On Call  . metoprolol tartrate  12.5 mg Oral BID  . multivitamin with minerals  1 tablet Oral Daily  . sodium chloride  3 mL Intravenous Q12H   PRN:  sodium chloride, acetaminophen, cyclobenzaprine, HYDROcodone-acetaminophen, methocarbamol, nitroGLYCERIN, ondansetron (ZOFRAN) IV, oxyCODONE-acetaminophen, sodium chloride, zolpidem  Diet:  NPO thin liquids Activity:  Up with assistance DVT Prophylaxis:   Scd, IV heparin  CLINICALLY SIGNIFICANT STUDIES Basic Metabolic Panel:   Recent Labs Lab 02/09/13 1505 02/10/13 0505  NA 135 137  K 4.1 3.9  CL 101 103  CO2 25 27  GLUCOSE 133* 93  BUN 14 13  CREATININE 1.34 1.35  CALCIUM 9.3 9.2   Liver Function Tests:   Recent Labs Lab 02/04/13 1955 02/06/13 1010  AST 25 21  ALT 17 15  ALKPHOS 87 84  BILITOT 0.4 0.3  PROT 6.7 6.3  ALBUMIN 3.6 3.2*   CBC:   Recent Labs Lab 02/06/13 0435 02/06/13 1010  02/09/13 0444 02/10/13 0505  WBC 5.3 4.7  < > 6.1 5.2  NEUTROABS  --  1.5*  --   --   --   HGB 11.9* 12.3*  < > 13.0 13.0  HCT 34.7* 35.3*  < > 36.8* 37.7*  MCV 95.3 94.6  < > 93.9 94.5  PLT 176 151  < > 174 183  < > = values in this interval not displayed. Coagulation:   Recent Labs Lab 02/06/13 0730 02/06/13 1010  LABPROT 13.1 13.5  INR 1.00 1.04   Cardiac Enzymes:   Recent Labs Lab 02/04/13 2016 02/05/13 0030 02/05/13 0730 02/06/13 1017  CKTOTAL  --   --   --  120  CKMB  --   --   --  1.7  TROPONINI 0.69* 0.56* 0.33*  --    Urinalysis: No results  found for this basename: COLORURINE, APPERANCEUR, LABSPEC, PHURINE, GLUCOSEU, HGBUR, BILIRUBINUR, KETONESUR, PROTEINUR, UROBILINOGEN, NITRITE, LEUKOCYTESUR,  in the last 168 hours Lipid Panel    Component Value Date/Time   CHOL 128 02/07/2013 0458   TRIG 80 02/07/2013 0458   HDL 35* 02/07/2013 0458   CHOLHDL 3.7 02/07/2013 0458   VLDL 16 02/07/2013 0458   LDLCALC 77 02/07/2013 0458   HgbA1C  Lab Results  Component Value Date   HGBA1C 5.9* 02/06/2013    Urine Drug Screen:   No results found for this basename: labopia,  cocainscrnur,  labbenz,  amphetmu,  thcu,  labbarb    Alcohol Level: No results found for this basename: ETH,  in the last 168 hours  Ct Head Wo Contrast 02/06/2013  Mild atrophy without acute intracranial abnormality. Critical Value/emergent results were called by telephone at the time of interpretation on 02/06/2013 at 1016 hours to P A Felicie Morn, who verbally acknowledged these results.  High attenuation of the intravascular compartment consistent with recent catheterization and contrast administration.   Original Report Authenticated By: Andreas Newport, M.D.   [name] Wo Contrast 02/06/2013  Limited MRI Head Wo Small left posterior insula/external capsule acute infarct. There may be several other punctate acute infarcts elsewhere in the posterior left MCA territory. 2.  Due to Code stroke TPA considerations, the study was truncated.   MRA HEAD WITHOUT  1.  Focal segment of non visualized flow in a left posterior MCA M2 branch as detailed above. 2.  Otherwise the major left MCA branches, and the anterior circulation, are within normal limits. 3.  Negative posterior circulation.    2D Echocardiogram - Left ventricle: The cavity size was normal. There was moderate concentric hypertrophy. Systolic function was normal. The estimated ejection fraction was in the range of 55% to 60%. Hypokinesis of the anteroseptal myocardium. - Aortic root: The aortic root was mildly dilated. Impressions: - No cardiac source of emboli was indentified.  Carotid Doppler    CXR  Stable exam, no active disease  EKG  normal sinus rhythm.   Therapy Recommendations No PT follow up.   Physical Exam  General: The patient is alert and cooperative at the time of the examination.  Skin: No significant peripheral edema is noted.   Neurologic Exam  Cranial nerves: Facial symmetry is present. Speech is notable for mild aphasia. The patient has some difficulty understanding verbal commands. Extraocular movements are full. Visual fields are notable for a relative right homonymous visual field deficit.  Motor: The patient has good strength in all 4 extremities.  Coordination: The patient has good finger-nose-finger and heel-to-shin bilaterally. Some apraxia is noted.  Gait and station: The gait was not tested.  Reflexes: Deep tendon reflexes are  symmetric.    ASSESSMENT Mr. Daniel Cox is a 67 y.o. male presenting with right hemiparesis, expressive aphasia upon completion of cardiac catherization which ultimately improved. Imaging confirms a small left posterior insula/external capsule acute infarct. There may be several other punctate acute infarcts elsewhere in the posterior left MCA territory, Infarct felt to be embolic secondary to cardiac cath.  On no antithrombotics prior to admission. Now on  heparin for secondary stroke prevention with plans to start plavix tomorrow. Patient with resultant right hemiparesis, expressive aphasia, improved. Work up underway.   LDL 77  HGB  A1C  5.9  Severe coronary artery disease  Chronic kidney disease  Erectile dysfunction  Cervical/lumbar degenerative disease  Long term medical use  Cox day #  6  TREATMENT/PLAN  Patient currently on IV heparin for PCI. Given small size of infarct, risk of hemorrhagic transformation of acute stroke should be small. Agree with plans to add clopidogrel 75 mg orally every day tomorrow - will also work for secondary stroke prevention   Ongoing Risk factor modification  F/u carotid dopplers   LAD PCI at 9a today  Will follow up tomorrow  Annie Main, MSN, RN, ANVP-BC, ANP-BC, GNP-BC [facility] Pager: [phone] 02/10/2013 9:08 AM  I have personally obtained a history, examined the patient, evaluated imaging results, and formulated the assessment and plan of care. I agree with the above.  Lesly Dukes

## 2013-02-10 NOTE — Progress Notes (Signed)
VASCULAR LAB PRELIMINARY  PRELIMINARY  PRELIMINARY  PRELIMINARY  Carotid duplex  completed.    Preliminary report:  Bilateral:  Less than 39% ICA stenosis.  Vertebral artery flow is antegrade.      Yana Schorr, RVT 02/10/2013, 3:18 PM

## 2013-02-10 NOTE — Progress Notes (Signed)
ANTICOAGULATION CONSULT NOTE - Follow Up Consult  Pharmacy Consult for Heparin Indication: chest pain/ACS and stroke  Allergies  Allergen Reactions  . Penicillins Hives, Itching and Rash    Patient Measurements: Height: 5\' 8"  (172.7 cm) Weight: 166 lb 12.8 oz (75.66 kg) IBW/kg (Calculated) : 68.4 Heparin Dosing Weight: 75 kg  Vital Signs: Temp: 98.8 F (37.1 C) (06/16 0407) Temp src: Oral (06/16 0407) BP: 105/71 mmHg (06/16 0407) Pulse Rate: 57 (06/16 0407)  Labs:  Recent Labs  02/08/13 0425 02/08/13 1409 02/09/13 0444 02/09/13 1505 02/10/13 0505  HGB 12.8*  --  13.0  --  13.0  HCT 36.1*  --  36.8*  --  37.7*  PLT 174  --  174  --  183  HEPARINUNFRC 0.64 0.56 0.34  --  0.28*  CREATININE  --   --   --  1.34 1.35    Estimated Creatinine Clearance: 52.8 ml/min (by C-G formula based on Cr of 1.35).   Medications:  Scheduled:  . atorvastatin  40 mg Oral q1800  . [START ON 02/11/2013] clopidogrel  75 mg Oral Q breakfast  . diazepam  5 mg Oral On Call  . metoprolol tartrate  12.5 mg Oral BID  . multivitamin with minerals  1 tablet Oral Daily  . sodium chloride  3 mL Intravenous Q12H   Infusions:  . sodium chloride 1 mL/kg/hr (02/10/13 0535)  . sodium chloride 1 mL/kg/hr (02/09/13 2229)  . heparin 850 Units/hr (02/10/13 0123)    Assessment: 67 yo M s/p cath (complicated by CVA) with multivessel disease.  Pt continues on heparin for anticoagulation awaiting PCI this morning.  Heparin level (0.28) is slightly subtherapeutic on 850 units/hr, cbc stable, no bleeding noted per chart.   Goal of Therapy:  Heparin level 0.3-0.5 units/ml Monitor platelets by anticoagulation protocol: Yes   Plan:  Leave heparin infusion at 850 units/hr for now. Follow-up plans after PCI.  Bayard Hugger, PharmD, BCPS  Clinical Pharmacist  Pager: [phone]  02/10/2013 8:22 AM

## 2013-02-10 NOTE — CV Procedure (Signed)
   PERCUTANEOUS CORONARY INTERVENTION   Daniel Cox is a 67 y.o. male  INDICATION: Acute coronary syndrome/non-ST elevation MI   PROCEDURE: LAD stent, drug-eluting   CONSENT: The risks, benefits, and details of the procedure were explained to the patient. Risks including death, MI, stroke, bleeding, limb ischemia, renal failure and allergy were described and accepted by the patient.  Informed written consent was obtained prior to proceeding.  PROCEDURE TECHNIQUE:  After Xylocaine anesthesia a 6 French sheath was placed in the right femoral artery with a single anterior needle wall stick.   Coronary guiding shots were made using a 4 cm 6 [place] XP LAD catheter. The aortic morphology caused tenuous guide support because it appears to be elongated. Antithrombotic therapy, bivalirudin bolus and infusion, was begun and determined to be therapeutic by ACT. Antiplatelet therapy, Plavix 300 mg at 6 AM, was loaded. The patient had been receiving Plavix over the weekend prior to the procedure.  PCI was then performed with some difficulty due to significant angulation and tortuosity of the  proximal LAD from the left main. We were eventually able to get a [place] wire distal in the LAD. Predilatation was performed with a 3.0 x 15 mm long balloon. We then placed a BMW as a buddy wire into the ramus intermedius branch. This supplied does not support to allow [place] to position and deployed a 3.0 x 24 mm Promus Premier drug-eluting stent. We then post dilated the stent to 3.25 proximally and 3.5 distally using 15 mm long [place] balloons. Peak pressure of 14 atmospheres. TIMI grade 3 flow was noted post deployment. Multiple doses of intracoronary nitroglycerin were administered. No chest discomfort or clinical complications occurred during the procedure.  Angio-Seal was performed with good hemostasis.  CONTRAST:  Total of 100 cc.  COMPLICATIONS:  None.    ANGIOGRAPHIC RESULTS:    99% mid LAD stenosis before  the margin of diagonal #1 reduced to 0% with TIMI grade 3 flow following stenting   IMPRESSIONS:  Successful DES implantation in the mid LAD with reduction in segmental 99% stenosis to 0% with TIMI grade 3 flow. The stent was post dilated to 3.5 mm in diameter   RECOMMENDATION:  Aspirin and Plavix. P2Y12 will be obtained. Possible discharge 02/11/13.    Lesleigh Noe, MD 02/10/2013 12:05 PM

## 2013-02-11 LAB — CBC
HCT: 34.4 % — ABNORMAL LOW (ref 39.0–52.0)
MCHC: 34.3 g/dL (ref 30.0–36.0)
Platelets: 169 10*3/uL (ref 150–400)
RDW: 12.3 % (ref 11.5–15.5)

## 2013-02-11 LAB — BASIC METABOLIC PANEL
BUN: 15 mg/dL (ref 6–23)
GFR calc Af Amer: 60 mL/min — ABNORMAL LOW (ref >90)
GFR calc non Af Amer: 52 mL/min — ABNORMAL LOW (ref >90)
Potassium: 4 mEq/L (ref 3.5–5.1)

## 2013-02-11 MED ORDER — METOPROLOL SUCCINATE ER 25 MG PO TB24: 25.0000 mg | ORAL_TABLET | Freq: Every day | ORAL | Status: DC

## 2013-02-11 MED ORDER — ASPIRIN 325 MG PO TBEC: 325.0000 mg | DELAYED_RELEASE_TABLET | Freq: Every day | ORAL | Status: DC

## 2013-02-11 MED ORDER — NITROGLYCERIN 0.4 MG SL SUBL: 0.4000 mg | SUBLINGUAL_TABLET | SUBLINGUAL | Status: DC | PRN

## 2013-02-11 MED ORDER — CLOPIDOGREL BISULFATE 75 MG PO TABS: 75.0000 mg | ORAL_TABLET | Freq: Every day | ORAL | Status: DC

## 2013-02-11 MED FILL — Sodium Chloride IV Soln 0.9%: INTRAVENOUS | Qty: 50 | Status: AC

## 2013-02-11 NOTE — Progress Notes (Signed)
Stroke Team Progress Note  HISTORY Daniel Cox is an 67 y.o. male who was getting a cardiac catheterization today when he suffered sudden onset right hemiplegia, expressive and receptive aphasia. At time of arrival patients hemiplegia was resolving but continued to show aphasia.receptive > expressive. Post CT scan, while waiting for coag studies patients speech improved and he was able to tell us his age, month, year, name objects and follow commands. In addition his right arm strength significantly improved.   Date last known well: 6.12.14  Time last known well: 08.42  NIHSS--initial 5 but end result was NIHSS 0  tPA Given: No: resolution of symptoms   SUBJECTIVE Patient on edge of bed. Family at bedside. He anticipates discharge today.  OBJECTIVE Most recent Vital Signs: Filed Vitals:   02/10/13 2009 02/11/13 0001 02/11/13 0417 02/11/13 0717  BP: 116/68 119/66 107/62 131/78  Pulse: 72 61 58 59  Temp: 97.5 F (36.4 C) 97.8 F (36.6 C) 97.7 F (36.5 C) 97.7 F (36.5 C)  TempSrc: Oral Oral Oral Oral  Resp: 18 18 18 18   Height:      Weight:   76.8 kg (169 lb 5 oz)   SpO2: 96% 95% 93% 97%   CBG (last 3)  No results found for this basename: GLUCAP,  in the last 72 hours  IV Fluid Intake:     MEDICATIONS  . aspirin EC  325 mg Oral Daily  . atorvastatin  40 mg Oral q1800  . clopidogrel  75 mg Oral Q breakfast  . metoprolol tartrate  12.5 mg Oral BID   PRN:  acetaminophen, cyclobenzaprine, methocarbamol, nitroGLYCERIN, ondansetron (ZOFRAN) IV, oxyCODONE-acetaminophen  Diet:  Cardiac thin liquids Activity:  Up with assistance DVT Prophylaxis:  Scd, IV heparin  CLINICALLY SIGNIFICANT STUDIES Basic Metabolic Panel:   Recent Labs Lab 02/10/13 0505 02/11/13 0506  NA 137 137  K 3.9 4.0  CL 103 103  CO2 27 25  GLUCOSE 93 93  BUN 13 15  CREATININE 1.35 1.38*  CALCIUM 9.2 8.8   Liver Function Tests:   Recent Labs Lab 02/04/13 1955 02/06/13 1010  AST 25 21   ALT 17 15  ALKPHOS 87 84  BILITOT 0.4 0.3  PROT 6.7 6.3  ALBUMIN 3.6 3.2*   CBC:   Recent Labs Lab 02/06/13 0435 02/06/13 1010  02/10/13 0505 02/11/13 0506  WBC 5.3 4.7  < > 5.2 5.9  NEUTROABS  --  1.5*  --   --   --   HGB 11.9* 12.3*  < > 13.0 11.8*  HCT 34.7* 35.3*  < > 37.7* 34.4*  MCV 95.3 94.6  < > 94.5 94.5  PLT 176 151  < > 183 169  < > = values in this interval not displayed. Coagulation:   Recent Labs Lab 02/06/13 0730 02/06/13 1010  LABPROT 13.1 13.5  INR 1.00 1.04   Cardiac Enzymes:   Recent Labs Lab 02/04/13 2016 02/05/13 0030 02/05/13 0730 02/06/13 1017  CKTOTAL  --   --   --  120  CKMB  --   --   --  1.7  TROPONINI 0.69* 0.56* 0.33*  --    Urinalysis: No results found for this basename: COLORURINE, APPERANCEUR, LABSPEC, PHURINE, GLUCOSEU, HGBUR, BILIRUBINUR, KETONESUR, PROTEINUR, UROBILINOGEN, NITRITE, LEUKOCYTESUR,  in the last 168 hours Lipid Panel    Component Value Date/Time   CHOL 128 02/07/2013 0458   TRIG 80 02/07/2013 0458   HDL 35* 02/07/2013 0458   CHOLHDL 3.7 02/07/2013  0458   VLDL 16 02/07/2013 0458   LDLCALC 77 02/07/2013 0458   HgbA1C  Lab Results  Component Value Date   HGBA1C 5.9* 02/06/2013    Urine Drug Screen:   No results found for this basename: labopia,  cocainscrnur,  labbenz,  amphetmu,  thcu,  labbarb    Alcohol Level: No results found for this basename: ETH,  in the last 168 hours  Ct Head Wo Contrast 02/06/2013  Mild atrophy without acute intracranial abnormality. Critical Value/emergent results were called by telephone at the time of interpretation on 02/06/2013 at 1016 hours to P A Felicie Morn, who verbally acknowledged these results.  High attenuation of the intravascular compartment consistent with recent catheterization and contrast administration.   Original Report Authenticated By: Andreas Newport, M.D.   Mr [facility] Wo Contrast 02/06/2013  Limited MRI Head Wo Small left posterior insula/external capsule acute  infarct. There may be several other punctate acute infarcts elsewhere in the posterior left MCA territory. 2.  Due to Code stroke TPA considerations, the study was truncated.   MRA HEAD WITHOUT  1.  Focal segment of non visualized flow in a left posterior MCA M2 branch as detailed above. 2.  Otherwise the major left MCA branches, and the anterior circulation, are within normal limits. 3.  Negative posterior circulation.    2D Echocardiogram - Left ventricle: The cavity size was normal. There was moderate concentric hypertrophy. Systolic function was normal. The estimated ejection fraction was in the range of 55% to 60%. Hypokinesis of the anteroseptal myocardium. - Aortic root: The aortic root was mildly dilated. Impressions: - No cardiac source of emboli was indentified.  Carotid Doppler  No evidence of hemodynamically significant internal carotid artery stenosis. Vertebral artery flow is antegrade.   CXR  Stable exam, no active disease  EKG  normal sinus rhythm.   Therapy Recommendations No PT follow up.   Physical Exam  General: The patient is alert and cooperative at the time of the examination.  Skin: No significant peripheral edema is noted.   Neurologic Exam  Cranial nerves: Facial symmetry is present. Speech is notable for mild aphasia. The patient has some difficulty understanding verbal commands. Extraocular movements are full. Visual fields are notable for a relative right homonymous visual field deficit.  Motor: The patient has good strength in all 4 extremities.  Coordination: The patient has good finger-nose-finger and heel-to-shin bilaterally. Some apraxia is noted.  Gait and station: The gait was not tested.  Reflexes: Deep tendon reflexes are symmetric.    ASSESSMENT Daniel Cox is a 67 y.o. male presenting with right hemiparesis, expressive aphasia upon completion of cardiac catherization which ultimately improved. Imaging confirms a small left  posterior insula/external capsule acute infarct. There may be several other punctate acute infarcts elsewhere in the posterior left MCA territory, Infarct felt to be embolic secondary to cardiac cath.  On no antithrombotics prior to admission. Now on  aspirin 325 mg orally every day and clopidogrel 75 mg orally every day for secondary stroke prevention with plans to start plavix tomorrow. Patient with resultant right hemiparesis, expressive aphasia, improved. Work up completed.   LDL 77  HGB  A1C  5.9  Severe coronary artery disease  Chronic kidney disease  Erectile dysfunction  Cervical/lumbar degenerative disease  Long term medical use  Hospital day # 7  TREATMENT/PLAN   Agree with clopidogrel 75 mg orally every day and aspirin for secondary stroke prevention   Ongoing Risk factor modification  No therapy needs No further stroke workup indicated. Ok for discharge from stroke standpoint. Patient has a 10-15% risk of having another stroke over the next year, the highest risk is within 2 weeks of the most recent stroke/TIA (risk of having a stroke following a stroke or TIA is the same). Ongoing risk factor control by Primary Care Physician/cardiology Stroke Service will sign off. Please call should any needs arise. Follow up with Dr. Anne Hahn, Stroke Clinic, in 2 months.   Annie Main, MSN, RN, ANVP-BC, ANP-BC, [facility] Pager: [phone] 02/11/2013 9:31 AM  I have personally obtained a history, examined the patient, evaluated imaging results, and formulated the assessment and plan of care. I agree with the above.  Ledora Bottcher KEITH

## 2013-02-11 NOTE — Progress Notes (Signed)
CARDIAC REHAB PHASE I   PRE:  Rate/Rhythm: 66 SR    BP: sitting 127/74    SaO2:   MODE:  Ambulation: 450 ft   POST:  Rate/Rhythm: 74 SR    BP: sitting 134/84     SaO2:   Tolerated well. No noted gait disturbance or memory/speech issues. VSS. Ed completed. Gave resources for smoking cessation. Interested in [facility] and requests his name be sent to [facility] CRPII. [phone]   Elissa Lovett Redbird Smith CES, ACSM 02/11/2013 9:40 AM

## 2013-02-11 NOTE — Discharge Summary (Signed)
Patient ID: Daniel Cox MRN: [id number] DOB/AGE: 1945/10/02 67 y.o.  Admit date: 02/04/2013 Discharge date: 02/11/2013  Primary Discharge Diagnosis: Acute coronary syndrome  Secondary Discharge Diagnosis: Acute embolic stroke during coronary angiography, 02/06/13  Hypertension  Chronic kidney disease stage III  Hyperlipidemia  Erectile dysfunction Patient Active Problem List   Diagnosis Date Noted  . Cerebral embolism 02/07/2013    Priority: High    Class: Acute  . Acute coronary syndrome 02/04/2013    Priority: High    Class: Acute  . Chronic kidney disease (CKD), stage III (moderate) 02/04/2013    Priority: Medium  . BPH (benign prostatic hypertrophy) 02/04/2013    Priority: Low    Class: Chronic  . Hyperlipidemia 02/04/2013    Priority: Low  . Erectile dysfunction 02/04/2013    Priority: Low    Class: Chronic    Significant Diagnostic Studies: 1. Coronary angiography, 02/06/13  2. CT and MRI/A. of the brain 02/06/13  3. 2-D echocardiogram 02/08/13  4. Carotid Doppler 02/10/13  5. Drug-eluting stent implantation 02/10/13  Consults: Neurology, stroke team 02/06/13.  [facility], Dr.Owen  Hospital Course: Patient was noted to the hospital with acute coronary syndrome, deeply inverted precordial T waves, prolonged chest pain with minimal activity, and mildly elevated cardiac markers. Initial creatinine was noted to be greater than 1.5. 24 hours of hydration was instituted. Creatinine decreased to less than 1.35. He underwent diagnostic coronary angiography on 02/06/13 and during the procedure developed aphasia.. The procedure was completed but planned PCI was not performed. Code stroke protocol was put into play. He was evaluated extensively and within 2 hours it was traumatic improvement in neurological status. He did not require TPA.  After analyzing the coronary anatomy a cardiovascular surgery consultation was  obtained. This was performed by Dr. Cornelius Moras. The pros and cons of surgical versus percutaneous revascularization were discussed. We decided upon culprit lesion angioplasty of the LAD. The patient remained on IV heparin aspirin and oral Plavix. IV nitroglycerin was discontinued. He remained stable until 02/10/13 when he underwent PCI with drug-eluting stent implantation into the LAD with a very nice in result. His further recuperation was uneventful. On the day of discharge he is ambulatory and felt eligible for discharge. Creatinine is 1.35. He does have mild aphasia that neurology feels well gradually improve over time. He will engage in the cardiac rehabilitation program. He is been instructed to discontinue smoking. Hemoglobin is 11.8.   Discharge Exam: Blood pressure 131/78, pulse 59, temperature 97.7 F (36.5 C), temperature source Oral, resp. rate 18, height 5\' 8"  (1.727 m), weight 76.8 kg (169 lb 5 oz), SpO2 97.00%.    No distress. The patient is conversant.No carotid bruits.Lungs clear.S4 gallop.Right femoral access site is unremarkable and with no hematoma or evidence of ecchymosis. neurological exam is grossly intact the please see the neurology note for finely detail with a speak of mild A. fascia and a relative right homonymous hemianopsia  Labs:   Lab Results  Component Value Date   WBC 5.9 02/11/2013   HGB 11.8* 02/11/2013   HCT 34.4* 02/11/2013   MCV 94.5 02/11/2013   PLT 169 02/11/2013    Recent Labs Lab 02/06/13 1010  02/11/13 0506  NA 134*  < > 137  K 4.4  < > 4.0  CL 104  < > 103  CO2 23  < > 25  BUN 13  < > 15  CREATININE 1.24  < > 1.38*  CALCIUM 8.3*  < > 8.8  PROT 6.3  --   --   BILITOT 0.3  --   --   ALKPHOS 84  --   --   ALT 15  --   --   AST 21  --   --   GLUCOSE 84  < > 93  < > = values in this interval not displayed. Lab Results  Component Value Date   CKTOTAL 120 02/06/2013   CKMB 1.7 02/06/2013   TROPONINI 0.33* 02/05/2013    Lab Results  Component Value  Date   CHOL 128 02/07/2013   CHOL 119 02/05/2013   Lab Results  Component Value Date   HDL 35* 02/07/2013   HDL 35* 02/05/2013   Lab Results  Component Value Date   LDLCALC 77 02/07/2013   LDLCALC 57 02/05/2013   Lab Results  Component Value Date   TRIG 80 02/07/2013   TRIG 133 02/05/2013   Lab Results  Component Value Date   CHOLHDL 3.7 02/07/2013   CHOLHDL 3.4 02/05/2013   No results found for this basename: LDLDIRECT      Radiology: No acute chest x-ray findings EKG: ECG on the day of discharge reveals essentially a normal tracing with the exception of markedly improved precordial T-wave inversion  FOLLOW UP PLANS AND APPOINTMENTS    Medication List    TAKE these medications       aspirin 325 MG EC tablet  Take 1 tablet (325 mg total) by mouth daily.     clopidogrel 75 MG tablet  Commonly known as:  PLAVIX  Take 1 tablet (75 mg total) by mouth daily with breakfast.     cyclobenzaprine 10 MG tablet  Commonly known as:  FLEXERIL  Take 10 mg by mouth at bedtime as needed for muscle spasms.     HYDROcodone-acetaminophen 5-325 MG per tablet  Commonly known as:  NORCO/VICODIN  Take 1 tablet by mouth every 6 (six) hours as needed for pain.     methocarbamol 750 MG tablet  Commonly known as:  ROBAXIN  Take 750 mg by mouth 2 (two) times daily as needed (for shoulder, neck, arm pain).     metoprolol succinate 25 MG 24 hr tablet  Commonly known as:  TOPROL XL  Take 1 tablet (25 mg total) by mouth daily.     multivitamin with minerals Tabs  Take 1 tablet by mouth daily.     nitroGLYCERIN 0.4 MG SL tablet  Commonly known as:  NITROSTAT  Place 1 tablet (0.4 mg total) under the tongue every 5 (five) minutes x 3 doses as needed for chest pain.     rosuvastatin 40 MG tablet  Commonly known as:  CRESTOR  Take 20 mg by mouth at bedtime.           Follow-up Information   Follow up On 02/19/2013. (2:30 PM)    Contact information:   [address]  [place] [address] [phone]       BRING ALL MEDICATIONS WITH YOU TO FOLLOW UP APPOINTMENTS  Time spent with patient to include physician time:35 min Signed: Lesleigh Noe 02/11/2013, 8:50 AM

## 2013-02-12 NOTE — Progress Notes (Signed)
Utilization Review Completed [place] S[place]la J. Milea Klink, RN, BSN, NCM [phone]  

## 2013-04-15 ENCOUNTER — Telehealth (HOSPITAL_COMMUNITY): Payer: Self-pay | Admitting: Cardiac Rehabilitation

## 2013-04-15 NOTE — Telephone Encounter (Signed)
After multiple phone calls and letter mailed  to patient, spoke to patient today to discuss cardiac rehab.  Pt is not interested in participating in cardiac rehab at this time.  Pt states he is exercising on his own

## 2013-05-22 ENCOUNTER — Encounter (HOSPITAL_COMMUNITY)
Admission: RE | Admit: 2013-05-22 | Discharge: 2013-05-22 | Disposition: A | Discharge status: Home / Self Care | Payer: Medicare Other | Source: Ambulatory Visit | Attending: Interventional Cardiology | Admitting: Interventional Cardiology

## 2013-05-22 DIAGNOSIS — I2510 Atherosclerotic heart disease of native coronary artery without angina pectoris: Secondary | ICD-10-CM | POA: Insufficient documentation

## 2013-05-22 DIAGNOSIS — Z9861 Coronary angioplasty status: Secondary | ICD-10-CM | POA: Insufficient documentation

## 2013-05-22 DIAGNOSIS — I200 Unstable angina: Secondary | ICD-10-CM | POA: Insufficient documentation

## 2013-05-22 DIAGNOSIS — Z5189 Encounter for other specified aftercare: Secondary | ICD-10-CM | POA: Insufficient documentation

## 2013-05-22 NOTE — Progress Notes (Signed)
Cardiac Rehab Medication Review by a Pharmacist  Does the patient  feel that his/her medications are working for him/her?  yes  Has the patient been experiencing any side effects to the medications prescribed?  no  Does the patient measure his/her own blood pressure or blood glucose at home?  no   Does the patient have any problems obtaining medications due to transportation or finances?   no  Understanding of regimen: good Understanding of indications: good Potential of compliance: fair    Pharmacist comments: Daniel Cox is a pleasant 67 yo male coming in for cardiac rehab this morning.  We reviewed his medications, and he reports taking most of his meds once daily.  This is usually in the morning with food, but sometimes he will take them with lunch if he does not take them in the AM.  He currently has two muscle relaxers on his profile, but he knows not to take them on the same day.  He takes the cyclobenzaprine if he feels he needs more of a rest, and only at bedtime.  The statin (Crestor) he takes in the morning which is fine d/t its longer half life, if it helps him to remember the dose the better.  He started using a TENS unit this last month which he reports has not helped with any pain.  He was instructed by his physician to stop taking his Viagra now that he has NG prn.  He does not take any OTC besides ASA and Vit D, and he explained he's been cautious d/t an episode that caused his kidneys to stop working for about a month.  He does not check BP at home.    Shelba Flake Achilles Dunk, PharmD Clinical Pharmacist - Resident Pager: [phone] Pharmacy: [phone] 05/22/2013 8:34 AM

## 2013-05-26 ENCOUNTER — Encounter (HOSPITAL_COMMUNITY)
Admission: RE | Admit: 2013-05-26 | Discharge: 2013-05-26 | Disposition: A | Discharge status: Home / Self Care | Payer: Medicare Other | Source: Ambulatory Visit | Attending: Interventional Cardiology | Admitting: Interventional Cardiology

## 2013-05-27 NOTE — Progress Notes (Signed)
Pt started cardiac rehab yesterday.  Pt tolerated light exercise without difficulty. Telemetry rhythm Sinus. Vital signs stable.Will continue to monitor the patient throughout  the program. Daniel Cox is going to exercise at 0645 on Wednesday.

## 2013-05-28 ENCOUNTER — Encounter (HOSPITAL_COMMUNITY)
Admission: RE | Admit: 2013-05-28 | Discharge: 2013-05-28 | Disposition: A | Discharge status: Home / Self Care | Payer: Non-veteran care | Source: Ambulatory Visit | Attending: Interventional Cardiology | Admitting: Interventional Cardiology

## 2013-05-28 ENCOUNTER — Encounter (HOSPITAL_COMMUNITY): Payer: Non-veteran care

## 2013-05-28 DIAGNOSIS — Z9861 Coronary angioplasty status: Secondary | ICD-10-CM | POA: Insufficient documentation

## 2013-05-28 DIAGNOSIS — Z5189 Encounter for other specified aftercare: Secondary | ICD-10-CM | POA: Insufficient documentation

## 2013-05-28 DIAGNOSIS — I2510 Atherosclerotic heart disease of native coronary artery without angina pectoris: Secondary | ICD-10-CM | POA: Insufficient documentation

## 2013-05-28 DIAGNOSIS — I200 Unstable angina: Secondary | ICD-10-CM | POA: Insufficient documentation

## 2013-05-30 ENCOUNTER — Encounter (HOSPITAL_COMMUNITY)
Admission: RE | Admit: 2013-05-30 | Discharge: 2013-05-30 | Disposition: A | Discharge status: Home / Self Care | Payer: Non-veteran care | Source: Ambulatory Visit | Attending: Interventional Cardiology | Admitting: Interventional Cardiology

## 2013-06-02 ENCOUNTER — Encounter (HOSPITAL_COMMUNITY)
Admission: RE | Admit: 2013-06-02 | Discharge: 2013-06-02 | Disposition: A | Discharge status: Home / Self Care | Payer: Non-veteran care | Source: Ambulatory Visit | Attending: Interventional Cardiology | Admitting: Interventional Cardiology

## 2013-06-02 NOTE — Progress Notes (Signed)
Reviewed home exercise with pt today.  Pt plans to walk and use treadmill at home for exercise.  Reviewed THR, pulse, RPE, sign and symptoms, NTG use, and when to call 911 or MD.  Pt voiced understanding. Jessica Hawkins, MA, ACSM RCEP  

## 2013-06-04 ENCOUNTER — Encounter: Payer: Self-pay | Admitting: Interventional Cardiology

## 2013-06-04 ENCOUNTER — Encounter (HOSPITAL_COMMUNITY)
Admission: RE | Admit: 2013-06-04 | Discharge: 2013-06-04 | Disposition: A | Discharge status: Home / Self Care | Payer: Non-veteran care | Source: Ambulatory Visit | Attending: Interventional Cardiology | Admitting: Interventional Cardiology

## 2013-06-04 ENCOUNTER — Encounter (HOSPITAL_COMMUNITY): Payer: Non-veteran care

## 2013-06-04 ENCOUNTER — Ambulatory Visit (INDEPENDENT_AMBULATORY_CARE_PROVIDER_SITE_OTHER): Payer: Medicare Other | Admitting: Interventional Cardiology

## 2013-06-04 VITALS — BP 134/84 | HR 65 | Ht 68.0 in | Wt 173.0 lb

## 2013-06-04 DIAGNOSIS — I2510 Atherosclerotic heart disease of native coronary artery without angina pectoris: Secondary | ICD-10-CM | POA: Insufficient documentation

## 2013-06-04 DIAGNOSIS — E785 Hyperlipidemia, unspecified: Secondary | ICD-10-CM

## 2013-06-04 DIAGNOSIS — I669 Occlusion and stenosis of unspecified cerebral artery: Secondary | ICD-10-CM

## 2013-06-04 DIAGNOSIS — I10 Essential (primary) hypertension: Secondary | ICD-10-CM | POA: Insufficient documentation

## 2013-06-04 NOTE — Patient Instructions (Addendum)
DO NOT USE NITRO WITHIN 24 HRS OF USING VIAGRA  Your physician recommends that you continue on your current medications as directed. Please refer to the Current Medication list given to you today.  Your physician recommends that you schedule a follow-up appointment in: 6 MONTHS WITH DR.SMITH

## 2013-06-04 NOTE — Progress Notes (Signed)
Patient ID: Daniel Cox, male   DOB: 18-Apr-1946, 67 y.o.   MRN: [id number]    HPI  Doing well and no problems. Denies angina. No medication side effects. Functioning independently. Has completed. His speech therapy for his embolic stroke. He has no complaints or questions.  Allergies  Allergen Reactions  . Penicillins Hives, Itching and Rash    Current Outpatient Prescriptions  Medication Sig Dispense Refill  . aspirin 325 MG EC tablet Take 325 mg by mouth every morning.      . cholecalciferol (VITAMIN D) 1000 UNITS tablet Take 1,000 Units by mouth every morning.      . clopidogrel (PLAVIX) 75 MG tablet Take 1 tablet (75 mg total) by mouth daily with breakfast.  30 tablet  11  . cyclobenzaprine (FLEXERIL) 10 MG tablet Take 10 mg by mouth at bedtime as needed for muscle spasms.      Marland Kitchen HYDROcodone-acetaminophen (NORCO/VICODIN) 5-325 MG per tablet Take 1 tablet by mouth every 6 (six) hours as needed for pain.      . methocarbamol (ROBAXIN) 750 MG tablet Take 750 mg by mouth 2 (two) times daily as needed (for shoulder, neck, arm pain).      . metoprolol succinate (TOPROL-XL) 25 MG 24 hr tablet Take 25 mg by mouth every morning.      . Multiple Vitamins-Minerals (MULTIVITAMIN WITH MINERALS) tablet Take 1 tablet by mouth every morning.      . nitroGLYCERIN (NITROSTAT) 0.4 MG SL tablet Place 1 tablet (0.4 mg total) under the tongue every 5 (five) minutes x 3 doses as needed for chest pain.  25 tablet  12  . rosuvastatin (CRESTOR) 40 MG tablet Take 40 mg by mouth every morning.       No current facility-administered medications for this visit.    Past Medical History  Diagnosis Date  . Chronic kidney disease   . Hyperlipidemia   . Erectile dysfunction   . Degenerative disc disease, cervical   . Degenerative lumbar disc     Past Surgical History  Procedure Laterality Date  . Right shoulder    . Appendectony      ROS: Denies new neurological symptoms. No claudication. Denies  palpitations, and syncope.   PHYSICAL EXAM BP 134/84  Pulse 65  Ht 5\' 8"  (1.727 m)  Wt 173 lb (78.472 kg)  BMI 26.31 kg/m2  SpO2 98%  Neck veins reveal no JVD or carotid bruits The chest is clear The cardiac exam is an S4 gallop and a soft 1 of 6 systolic murmur No peripheral edema Facial asymmetry with left droop.  EKG: Not performed today  ASSESSMENT AND PLAN  1. Coronary atherosclerosis with LAD stent, asymptomatic.  2. Cerebral embolus occurring at the time of cath, with dramatic improvement. In absence of functional disability.  3. Hypertension. Controlled.  4. Hyperlipidemia, on therapy .  Overall, he is doing well. He denies any complaints. No medication side effects. We'll plan to see him back in 6 months.

## 2013-06-06 ENCOUNTER — Encounter (HOSPITAL_COMMUNITY)
Admission: RE | Admit: 2013-06-06 | Discharge: 2013-06-06 | Disposition: A | Discharge status: Home / Self Care | Payer: Non-veteran care | Source: Ambulatory Visit | Attending: Interventional Cardiology | Admitting: Interventional Cardiology

## 2013-06-09 ENCOUNTER — Encounter (HOSPITAL_COMMUNITY)
Admission: RE | Admit: 2013-06-09 | Discharge: 2013-06-09 | Disposition: A | Discharge status: Home / Self Care | Payer: Non-veteran care | Source: Ambulatory Visit | Attending: Interventional Cardiology | Admitting: Interventional Cardiology

## 2013-06-11 ENCOUNTER — Encounter (HOSPITAL_COMMUNITY): Payer: Non-veteran care

## 2013-06-11 ENCOUNTER — Encounter (HOSPITAL_COMMUNITY)
Admission: RE | Admit: 2013-06-11 | Discharge: 2013-06-11 | Disposition: A | Discharge status: Home / Self Care | Payer: Non-veteran care | Source: Ambulatory Visit | Attending: Interventional Cardiology | Admitting: Interventional Cardiology

## 2013-06-13 ENCOUNTER — Encounter (HOSPITAL_COMMUNITY): Payer: Non-veteran care

## 2013-06-16 ENCOUNTER — Encounter (HOSPITAL_COMMUNITY)
Admission: RE | Admit: 2013-06-16 | Discharge: 2013-06-16 | Disposition: A | Discharge status: Home / Self Care | Payer: Non-veteran care | Source: Ambulatory Visit | Attending: Interventional Cardiology | Admitting: Interventional Cardiology

## 2013-06-18 ENCOUNTER — Encounter (HOSPITAL_COMMUNITY): Payer: Non-veteran care

## 2013-06-18 ENCOUNTER — Encounter (HOSPITAL_COMMUNITY)
Admission: RE | Admit: 2013-06-18 | Discharge: 2013-06-18 | Disposition: A | Discharge status: Home / Self Care | Payer: Non-veteran care | Source: Ambulatory Visit | Attending: Interventional Cardiology | Admitting: Interventional Cardiology

## 2013-06-20 ENCOUNTER — Encounter (HOSPITAL_COMMUNITY)
Admission: RE | Admit: 2013-06-20 | Discharge: 2013-06-20 | Disposition: A | Discharge status: Home / Self Care | Payer: Non-veteran care | Source: Ambulatory Visit | Attending: Interventional Cardiology | Admitting: Interventional Cardiology

## 2013-06-23 ENCOUNTER — Encounter (HOSPITAL_COMMUNITY)
Admission: RE | Admit: 2013-06-23 | Discharge: 2013-06-23 | Disposition: A | Discharge status: Home / Self Care | Payer: Non-veteran care | Source: Ambulatory Visit | Attending: Interventional Cardiology | Admitting: Interventional Cardiology

## 2013-06-25 ENCOUNTER — Encounter (HOSPITAL_COMMUNITY)
Admission: RE | Admit: 2013-06-25 | Discharge: 2013-06-25 | Disposition: A | Discharge status: Home / Self Care | Payer: Non-veteran care | Source: Ambulatory Visit | Attending: Interventional Cardiology | Admitting: Interventional Cardiology

## 2013-06-25 ENCOUNTER — Encounter (HOSPITAL_COMMUNITY): Payer: Non-veteran care

## 2013-06-27 ENCOUNTER — Encounter (HOSPITAL_COMMUNITY)
Admission: RE | Admit: 2013-06-27 | Discharge: 2013-06-27 | Disposition: A | Discharge status: Home / Self Care | Payer: Non-veteran care | Source: Ambulatory Visit | Attending: Interventional Cardiology | Admitting: Interventional Cardiology

## 2013-06-27 NOTE — Progress Notes (Signed)
Daniel Cox 67 y.o. male Nutrition Note Spoke with pt.  Nutrition Plan and Nutrition Survey goals reviewed with pt. Pt is following Step 1 of the Therapeutic Lifestyle Changes diet. According to pt's last A1c, pt is pre-diabetic. Pre-diabetes discussed. Pt encouraged to discuss pre-diabetes further with his PCP. Pt expressed understanding of the information reviewed. Pt aware of nutrition education classes offered and is unable to attend nutrition classes.  Nutrition Diagnosis   Food-and nutrition-related knowledge deficit related to lack of exposure to information as related to diagnosis of: ? CVD ? Pre-DM (A1c 5.7) ?  Nutrition RX/ Estimated Daily Nutrition Needs for: wt maintenance 1750-2250 Kcal, 45-60 gm fat, 11-15 gm sat fat, 1.7-2.3 gm trans-fat, <1500 mg sodium   Nutrition Intervention   Pt's individual nutrition plan including cholesterol goals reviewed with pt.   Benefits of adopting Therapeutic Lifestyle Changes discussed when Medficts reviewed.   Pt to attend the Portion Distortion class   Pt given handouts for: ? Nutrition I class ? Nutrition II class   Continue client-centered nutrition education by RD, as part of interdisciplinary care.  Goal(s)   Pt to identify and limit food sources of saturated fat, trans fat, and cholesterol  Monitor and Evaluate progress toward nutrition goal with team. Nutrition Risk:  Low   Mickle Plumb, M.Ed, RD, LDN, CDE 06/27/2013 11:11 AM

## 2013-06-30 ENCOUNTER — Encounter (HOSPITAL_COMMUNITY)
Admission: RE | Admit: 2013-06-30 | Discharge: 2013-06-30 | Disposition: A | Discharge status: Home / Self Care | Payer: Non-veteran care | Source: Ambulatory Visit | Attending: Interventional Cardiology | Admitting: Interventional Cardiology

## 2013-06-30 DIAGNOSIS — Z9861 Coronary angioplasty status: Secondary | ICD-10-CM | POA: Insufficient documentation

## 2013-06-30 DIAGNOSIS — I200 Unstable angina: Secondary | ICD-10-CM | POA: Insufficient documentation

## 2013-06-30 DIAGNOSIS — Z5189 Encounter for other specified aftercare: Secondary | ICD-10-CM | POA: Insufficient documentation

## 2013-06-30 DIAGNOSIS — I2510 Atherosclerotic heart disease of native coronary artery without angina pectoris: Secondary | ICD-10-CM | POA: Insufficient documentation

## 2013-07-02 ENCOUNTER — Encounter (HOSPITAL_COMMUNITY): Payer: Non-veteran care

## 2013-07-02 ENCOUNTER — Encounter (HOSPITAL_COMMUNITY)
Admission: RE | Admit: 2013-07-02 | Discharge: 2013-07-02 | Disposition: A | Discharge status: Home / Self Care | Payer: Non-veteran care | Source: Ambulatory Visit | Attending: Interventional Cardiology | Admitting: Interventional Cardiology

## 2013-07-04 ENCOUNTER — Telehealth (HOSPITAL_COMMUNITY): Payer: Self-pay | Admitting: Family Medicine

## 2013-07-04 ENCOUNTER — Encounter (HOSPITAL_COMMUNITY): Payer: Non-veteran care

## 2013-07-07 ENCOUNTER — Encounter (HOSPITAL_COMMUNITY)
Admission: RE | Admit: 2013-07-07 | Discharge: 2013-07-07 | Disposition: A | Discharge status: Home / Self Care | Payer: Non-veteran care | Source: Ambulatory Visit | Attending: Interventional Cardiology | Admitting: Interventional Cardiology

## 2013-07-08 ENCOUNTER — Ambulatory Visit: Payer: Non-veteran care | Admitting: Occupational Therapy

## 2013-07-09 ENCOUNTER — Encounter (HOSPITAL_COMMUNITY): Payer: Non-veteran care

## 2013-07-09 ENCOUNTER — Encounter (HOSPITAL_COMMUNITY)
Admission: RE | Admit: 2013-07-09 | Discharge: 2013-07-09 | Disposition: A | Discharge status: Home / Self Care | Payer: Non-veteran care | Source: Ambulatory Visit | Attending: Interventional Cardiology | Admitting: Interventional Cardiology

## 2013-07-10 ENCOUNTER — Ambulatory Visit: Payer: Non-veteran care | Attending: Family Medicine | Admitting: Occupational Therapy

## 2013-07-10 DIAGNOSIS — IMO0001 Reserved for inherently not codable concepts without codable children: Secondary | ICD-10-CM | POA: Insufficient documentation

## 2013-07-10 DIAGNOSIS — M6281 Muscle weakness (generalized): Secondary | ICD-10-CM | POA: Insufficient documentation

## 2013-07-10 DIAGNOSIS — M2550 Pain in unspecified joint: Secondary | ICD-10-CM | POA: Insufficient documentation

## 2013-07-11 ENCOUNTER — Encounter (HOSPITAL_COMMUNITY)
Admission: RE | Admit: 2013-07-11 | Discharge: 2013-07-11 | Disposition: A | Discharge status: Home / Self Care | Payer: Non-veteran care | Source: Ambulatory Visit | Attending: Interventional Cardiology | Admitting: Interventional Cardiology

## 2013-07-14 ENCOUNTER — Encounter (HOSPITAL_COMMUNITY)
Admission: RE | Admit: 2013-07-14 | Discharge: 2013-07-14 | Disposition: A | Discharge status: Home / Self Care | Payer: Non-veteran care | Source: Ambulatory Visit | Attending: Interventional Cardiology | Admitting: Interventional Cardiology

## 2013-07-16 ENCOUNTER — Encounter (HOSPITAL_COMMUNITY): Payer: Non-veteran care

## 2013-07-16 ENCOUNTER — Encounter (HOSPITAL_COMMUNITY)
Admission: RE | Admit: 2013-07-16 | Discharge: 2013-07-16 | Disposition: A | Discharge status: Home / Self Care | Payer: Non-veteran care | Source: Ambulatory Visit | Attending: Interventional Cardiology | Admitting: Interventional Cardiology

## 2013-07-18 ENCOUNTER — Encounter (HOSPITAL_COMMUNITY)
Admission: RE | Admit: 2013-07-18 | Discharge: 2013-07-18 | Disposition: A | Discharge status: Home / Self Care | Payer: Non-veteran care | Source: Ambulatory Visit | Attending: Interventional Cardiology | Admitting: Interventional Cardiology

## 2013-07-21 ENCOUNTER — Encounter (HOSPITAL_COMMUNITY)
Admission: RE | Admit: 2013-07-21 | Discharge: 2013-07-21 | Disposition: A | Discharge status: Home / Self Care | Payer: Non-veteran care | Source: Ambulatory Visit | Attending: Interventional Cardiology | Admitting: Interventional Cardiology

## 2013-07-22 ENCOUNTER — Ambulatory Visit: Payer: Non-veteran care | Admitting: Occupational Therapy

## 2013-07-23 ENCOUNTER — Ambulatory Visit: Payer: Non-veteran care | Admitting: Occupational Therapy

## 2013-07-23 ENCOUNTER — Encounter (HOSPITAL_COMMUNITY): Payer: Non-veteran care

## 2013-07-23 ENCOUNTER — Encounter (HOSPITAL_COMMUNITY)
Admission: RE | Admit: 2013-07-23 | Discharge: 2013-07-23 | Disposition: A | Discharge status: Home / Self Care | Payer: Non-veteran care | Source: Ambulatory Visit | Attending: Interventional Cardiology | Admitting: Interventional Cardiology

## 2013-07-25 ENCOUNTER — Encounter (HOSPITAL_COMMUNITY): Payer: Non-veteran care

## 2013-07-28 ENCOUNTER — Encounter (HOSPITAL_COMMUNITY)
Admission: RE | Admit: 2013-07-28 | Discharge: 2013-07-28 | Disposition: A | Discharge status: Home / Self Care | Payer: Non-veteran care | Source: Ambulatory Visit | Attending: Interventional Cardiology | Admitting: Interventional Cardiology

## 2013-07-28 ENCOUNTER — Telehealth: Payer: Self-pay

## 2013-07-28 DIAGNOSIS — Z9861 Coronary angioplasty status: Secondary | ICD-10-CM | POA: Insufficient documentation

## 2013-07-28 DIAGNOSIS — I200 Unstable angina: Secondary | ICD-10-CM | POA: Insufficient documentation

## 2013-07-28 DIAGNOSIS — I2510 Atherosclerotic heart disease of native coronary artery without angina pectoris: Secondary | ICD-10-CM | POA: Insufficient documentation

## 2013-07-28 DIAGNOSIS — Z5189 Encounter for other specified aftercare: Secondary | ICD-10-CM | POA: Insufficient documentation

## 2013-07-28 NOTE — Telephone Encounter (Signed)
received fax from cardiac rehab Byrd Hesselbach Whitaker,RN)about pt elevated bp while exercising.reviewed by Dr.Smith.Dr.Smith sts that pt bp readings are ok.Maria at cardiac rehab verbalized understanding

## 2013-07-28 NOTE — Progress Notes (Addendum)
Daniel Cox's blood pressure was noted at 168/78 on the nustep today at cardiac rehab. Daniel Cox exceeded his work load. Work load decreased. Repeat blood pressure 138/78. Exit blood pressure 122/72. Will fax exercise flow sheets to Dr. Michaelle Copas  office for review. Daniel Cox took his medications this morning as prescribed. Will continue to monitor the patient throughout  the program.

## 2013-07-29 ENCOUNTER — Ambulatory Visit: Payer: Non-veteran care | Attending: Family Medicine | Admitting: Occupational Therapy

## 2013-07-29 DIAGNOSIS — M2550 Pain in unspecified joint: Secondary | ICD-10-CM | POA: Insufficient documentation

## 2013-07-29 DIAGNOSIS — M6281 Muscle weakness (generalized): Secondary | ICD-10-CM | POA: Insufficient documentation

## 2013-07-29 DIAGNOSIS — IMO0001 Reserved for inherently not codable concepts without codable children: Secondary | ICD-10-CM | POA: Insufficient documentation

## 2013-07-30 ENCOUNTER — Encounter (HOSPITAL_COMMUNITY)
Admission: RE | Admit: 2013-07-30 | Discharge: 2013-07-30 | Disposition: A | Discharge status: Home / Self Care | Payer: Non-veteran care | Source: Ambulatory Visit | Attending: Interventional Cardiology | Admitting: Interventional Cardiology

## 2013-07-30 ENCOUNTER — Encounter (HOSPITAL_COMMUNITY): Payer: Non-veteran care

## 2013-07-31 ENCOUNTER — Ambulatory Visit: Payer: Non-veteran care | Admitting: Occupational Therapy

## 2013-08-01 ENCOUNTER — Encounter: Payer: Self-pay | Admitting: Interventional Cardiology

## 2013-08-01 ENCOUNTER — Encounter (HOSPITAL_COMMUNITY)
Admission: RE | Admit: 2013-08-01 | Discharge: 2013-08-01 | Disposition: A | Discharge status: Home / Self Care | Payer: Non-veteran care | Source: Ambulatory Visit | Attending: Interventional Cardiology | Admitting: Interventional Cardiology

## 2013-08-04 ENCOUNTER — Encounter (HOSPITAL_COMMUNITY)
Admission: RE | Admit: 2013-08-04 | Discharge: 2013-08-04 | Disposition: A | Discharge status: Home / Self Care | Payer: Non-veteran care | Source: Ambulatory Visit | Attending: Interventional Cardiology | Admitting: Interventional Cardiology

## 2013-08-05 ENCOUNTER — Ambulatory Visit: Payer: Non-veteran care | Admitting: Occupational Therapy

## 2013-08-06 ENCOUNTER — Encounter (HOSPITAL_COMMUNITY)
Admission: RE | Admit: 2013-08-06 | Discharge: 2013-08-06 | Disposition: A | Discharge status: Home / Self Care | Payer: Non-veteran care | Source: Ambulatory Visit | Attending: Interventional Cardiology | Admitting: Interventional Cardiology

## 2013-08-06 ENCOUNTER — Encounter (HOSPITAL_COMMUNITY): Payer: Non-veteran care

## 2013-08-07 ENCOUNTER — Encounter: Payer: Medicare Other | Admitting: Occupational Therapy

## 2013-08-08 ENCOUNTER — Encounter (HOSPITAL_COMMUNITY)
Admission: RE | Admit: 2013-08-08 | Discharge: 2013-08-08 | Disposition: A | Discharge status: Home / Self Care | Payer: Non-veteran care | Source: Ambulatory Visit | Attending: Interventional Cardiology | Admitting: Interventional Cardiology

## 2013-08-11 ENCOUNTER — Encounter (HOSPITAL_COMMUNITY)
Admission: RE | Admit: 2013-08-11 | Discharge: 2013-08-11 | Disposition: A | Discharge status: Home / Self Care | Payer: Non-veteran care | Source: Ambulatory Visit | Attending: Interventional Cardiology | Admitting: Interventional Cardiology

## 2013-08-12 ENCOUNTER — Encounter: Payer: Medicare Other | Admitting: Occupational Therapy

## 2013-08-13 ENCOUNTER — Encounter (HOSPITAL_COMMUNITY)
Admission: RE | Admit: 2013-08-13 | Discharge: 2013-08-13 | Disposition: A | Discharge status: Home / Self Care | Payer: Non-veteran care | Source: Ambulatory Visit | Attending: Interventional Cardiology | Admitting: Interventional Cardiology

## 2013-08-13 ENCOUNTER — Encounter (HOSPITAL_COMMUNITY): Payer: Non-veteran care

## 2013-08-14 ENCOUNTER — Encounter: Payer: Medicare Other | Admitting: Occupational Therapy

## 2013-08-15 ENCOUNTER — Encounter (HOSPITAL_COMMUNITY)
Admission: RE | Admit: 2013-08-15 | Discharge: 2013-08-15 | Disposition: A | Discharge status: Home / Self Care | Payer: Non-veteran care | Source: Ambulatory Visit | Attending: Interventional Cardiology | Admitting: Interventional Cardiology

## 2013-08-18 ENCOUNTER — Encounter (HOSPITAL_COMMUNITY)
Admission: RE | Admit: 2013-08-18 | Discharge: 2013-08-18 | Disposition: A | Discharge status: Home / Self Care | Payer: Non-veteran care | Source: Ambulatory Visit | Attending: Interventional Cardiology | Admitting: Interventional Cardiology

## 2013-08-20 ENCOUNTER — Encounter (HOSPITAL_COMMUNITY): Payer: Non-veteran care

## 2013-08-22 ENCOUNTER — Encounter (HOSPITAL_COMMUNITY): Payer: Non-veteran care

## 2013-08-25 ENCOUNTER — Encounter (HOSPITAL_COMMUNITY)
Admission: RE | Admit: 2013-08-25 | Discharge: 2013-08-25 | Disposition: A | Discharge status: Home / Self Care | Payer: Non-veteran care | Source: Ambulatory Visit | Attending: Interventional Cardiology | Admitting: Interventional Cardiology

## 2013-08-27 ENCOUNTER — Encounter (HOSPITAL_COMMUNITY): Payer: Non-veteran care

## 2013-08-27 ENCOUNTER — Encounter (HOSPITAL_COMMUNITY)
Admission: RE | Admit: 2013-08-27 | Discharge: 2013-08-27 | Disposition: A | Discharge status: Home / Self Care | Payer: Non-veteran care | Source: Ambulatory Visit | Attending: Interventional Cardiology | Admitting: Interventional Cardiology

## 2013-08-27 ENCOUNTER — Encounter (HOSPITAL_COMMUNITY): Payer: Self-pay

## 2013-08-27 NOTE — Progress Notes (Signed)
Pt graduated from cardiac rehab program today.  Medication list reconciled.  PHQ9 score- 0 .  Pt has made significant lifestyle changes and should be commended for his success. Pt plans to continue exercise in at [employer] participating in [employer] program.

## 2013-08-29 ENCOUNTER — Encounter (HOSPITAL_COMMUNITY): Payer: Non-veteran care

## 2013-09-01 ENCOUNTER — Encounter (HOSPITAL_COMMUNITY): Payer: Non-veteran care

## 2013-09-03 ENCOUNTER — Encounter (HOSPITAL_COMMUNITY): Payer: Non-veteran care

## 2013-09-05 ENCOUNTER — Encounter (HOSPITAL_COMMUNITY): Payer: Non-veteran care

## 2013-09-08 ENCOUNTER — Encounter (HOSPITAL_COMMUNITY): Payer: Non-veteran care

## 2013-09-10 ENCOUNTER — Encounter (HOSPITAL_COMMUNITY): Payer: Non-veteran care

## 2013-09-12 ENCOUNTER — Encounter (HOSPITAL_COMMUNITY): Payer: Non-veteran care

## 2013-09-15 ENCOUNTER — Encounter (HOSPITAL_COMMUNITY): Payer: Non-veteran care

## 2013-09-19 ENCOUNTER — Encounter (HOSPITAL_COMMUNITY): Payer: Non-veteran care

## 2013-09-22 ENCOUNTER — Encounter (HOSPITAL_COMMUNITY): Payer: Non-veteran care

## 2013-09-26 ENCOUNTER — Encounter (HOSPITAL_COMMUNITY): Payer: Non-veteran care

## 2013-09-29 ENCOUNTER — Encounter (HOSPITAL_COMMUNITY): Payer: Non-veteran care

## 2013-10-03 ENCOUNTER — Encounter (HOSPITAL_COMMUNITY): Payer: Non-veteran care

## 2013-10-06 ENCOUNTER — Encounter (HOSPITAL_COMMUNITY): Payer: Non-veteran care

## 2013-10-10 ENCOUNTER — Encounter (HOSPITAL_COMMUNITY): Payer: Non-veteran care

## 2013-10-13 ENCOUNTER — Encounter (HOSPITAL_COMMUNITY): Payer: Non-veteran care

## 2013-10-17 ENCOUNTER — Encounter (HOSPITAL_COMMUNITY): Payer: Non-veteran care

## 2014-01-05 ENCOUNTER — Ambulatory Visit (INDEPENDENT_AMBULATORY_CARE_PROVIDER_SITE_OTHER): Payer: Medicare Other | Admitting: Interventional Cardiology

## 2014-01-05 ENCOUNTER — Encounter: Payer: Self-pay | Admitting: Interventional Cardiology

## 2014-01-05 VITALS — BP 132/84 | HR 75 | Ht 68.0 in | Wt 174.8 lb

## 2014-01-05 DIAGNOSIS — E785 Hyperlipidemia, unspecified: Secondary | ICD-10-CM | POA: Diagnosis not present

## 2014-01-05 DIAGNOSIS — I2510 Atherosclerotic heart disease of native coronary artery without angina pectoris: Secondary | ICD-10-CM | POA: Diagnosis not present

## 2014-01-05 DIAGNOSIS — I669 Occlusion and stenosis of unspecified cerebral artery: Secondary | ICD-10-CM | POA: Diagnosis not present

## 2014-01-05 DIAGNOSIS — I10 Essential (primary) hypertension: Secondary | ICD-10-CM | POA: Diagnosis not present

## 2014-01-05 NOTE — Progress Notes (Signed)
Patient ID: MARSHAL ESKEW, male   DOB: 03/29/1946, 68 y.o.   MRN: [id number]    [address] [place],   [address] Phone: [phone] Fax:  [phone]  Date:  01/05/2014   ID:  PATRICIA PERALES, DOB August 09, 1946, MRN [id number]  PCP:  Mayra Neer, MD   ASSESSMENT:  1. Coronary atherosclerotic heart disease with prior PCI during ACS 2. Embolic CVA suffered as a complication of coronary angiography 3. Hypertension, controlled 4. Hyperlipidemia  PLAN:  1. Instructed to continue the current medical regimen as listed. Would be okay to discontinue dual antiplatelet therapy prior to cervical disc surgery coming up in July. 2. Low salt diet 3. Call if angina     SUBJECTIVE: LUBY SEAMANS is a 68 y.o. male is doing well. Stroke symptoms have completely resolved. He feels his speech is back to normal/near normal. He is having arm weakness related to cervical disc disease. He is planning to have cervical disc surgery performed in June or July at the [facility]. He inquires about whether this would be safe from a cardiac standpoint. I believe it would be and discontinuing the dual antiplatelet therapy for one to 2 weeks would not be a problem. My attention would be for him to resume dual antiplatelet therapy when safe after surgery.   Wt Readings from Last 3 Encounters:  01/05/14 174 lb 12.8 oz (79.289 kg)  06/04/13 173 lb (78.472 kg)  05/22/13 171 lb 15.3 oz (78 kg)     Past Medical History  Diagnosis Date  . Chronic kidney disease   . Hyperlipidemia   . Erectile dysfunction   . Degenerative disc disease, cervical   . Degenerative lumbar disc     Current Outpatient Prescriptions  Medication Sig Dispense Refill  . aspirin 325 MG EC tablet Take 325 mg by mouth every morning.      . cholecalciferol (VITAMIN D) 1000 UNITS tablet Take 1,000 Units by mouth every morning.      . clindamycin (CLEOCIN) 150 MG capsule Take 1 capsule by mouth daily.      .  clopidogrel (PLAVIX) 75 MG tablet Take 1 tablet (75 mg total) by mouth daily with breakfast.  30 tablet  11  . cyclobenzaprine (FLEXERIL) 10 MG tablet Take 10 mg by mouth at bedtime as needed for muscle spasms.      . diclofenac (VOLTAREN) 75 MG EC tablet Take 1 tablet by mouth as needed.      Marland Kitchen HYDROcodone-acetaminophen (NORCO/VICODIN) 5-325 MG per tablet Take 1 tablet by mouth every 6 (six) hours as needed for pain.      . methocarbamol (ROBAXIN) 750 MG tablet Take 750 mg by mouth 2 (two) times daily as needed (for shoulder, neck, arm pain).      . metoprolol succinate (TOPROL-XL) 25 MG 24 hr tablet Take 25 mg by mouth every morning.      . Multiple Vitamins-Minerals (MULTIVITAMIN WITH MINERALS) tablet Take 1 tablet by mouth every morning.      . nitroGLYCERIN (NITROSTAT) 0.4 MG SL tablet Place 1 tablet (0.4 mg total) under the tongue every 5 (five) minutes x 3 doses as needed for chest pain.  25 tablet  12  . rosuvastatin (CRESTOR) 40 MG tablet Take 40 mg by mouth every morning.       No current facility-administered medications for this visit.    Allergies:    Allergies  Allergen Reactions  . Penicillins Hives, Itching and Rash  Social History:  The patient  reports that he has quit smoking. He does not have any smokeless tobacco history on file. He reports that he does not drink alcohol or use illicit drugs.   ROS:  Please see the history of present illness.   No bleeding. No new neurological complaints.   All other systems reviewed and negative.   OBJECTIVE: VS:  Ht 5\' 8"  (1.727 m)  Wt 174 lb 12.8 oz (79.289 kg)  BMI 26.58 kg/m2 Well nourished, well developed, in no acute distress, younger than stated age 41: normal Neck: JVD flat. Carotid bruit absent  Cardiac:  normal S1, S2; RRR; no murmur Lungs:  clear to auscultation bilaterally, no wheezing, rhonchi or rales Abd: soft, nontender, no hepatomegaly Ext: Edema absent. Pulses 2+ and symmetric Skin: warm and dry Neuro:   CNs 2-12 intact, no focal abnormalities noted  EKG:  Normal       Signed, Illene Labrador III, MD 01/05/2014 3:31 PM

## 2014-01-05 NOTE — Patient Instructions (Signed)
Your physician recommends that you continue on your current medications as directed. Please refer to the Current Medication list given to you today.  You are cleared for your upcoming surgery  Your physician wants you to follow-up in: 1 year You will receive a reminder letter in the mail two months in advance. If you don't receive a letter, please call our office to schedule the follow-up appointment.

## 2014-02-23 ENCOUNTER — Telehealth: Payer: Self-pay | Admitting: Interventional Cardiology

## 2014-02-23 NOTE — Telephone Encounter (Signed)
Walk in Pt Form " Pt Needs Neck Clearance"  Gave to [facility]  6.29.15/km

## 2014-02-25 ENCOUNTER — Telehealth: Payer: Self-pay

## 2014-02-25 NOTE — Telephone Encounter (Signed)
called to adv pt that cardiac clearance has been faxed  to the [facility]. and to answer pt question about a stent card. lmtcb

## 2014-02-25 NOTE — Telephone Encounter (Signed)
New Message:  Pt is returning a call to [employer]

## 2014-02-25 NOTE — Telephone Encounter (Signed)
pt adv that we do not issue stent cards in the office.adv pt that a stent card should have been given to him over at the hospital upon d/c. adv pt I could provide him with the type of stent he has and the location,but I do not have info i.e., serial #,lot #, adv him he could attempt to call [facility] medical records who might be able to provide him that info.pt did not want to do that and was annoyed that we could not generate him a stent card, and stated "never mind". Pt aware cardiac clearance faxed to [facility] to contact info provided by pt. Attn: Anesthesiology Quintin Alto Vogele PA-C) [phone] ext.[phone], fax # [phone]. Pt verbalized understanding

## 2014-08-06 ENCOUNTER — Encounter (HOSPITAL_COMMUNITY): Payer: Self-pay | Admitting: Interventional Cardiology

## 2015-02-12 ENCOUNTER — Encounter: Payer: Self-pay | Admitting: Interventional Cardiology

## 2015-02-12 ENCOUNTER — Ambulatory Visit (INDEPENDENT_AMBULATORY_CARE_PROVIDER_SITE_OTHER): Payer: Medicare Other | Admitting: Interventional Cardiology

## 2015-02-12 VITALS — BP 118/78 | HR 64 | Ht 68.0 in | Wt 177.0 lb

## 2015-02-12 DIAGNOSIS — E785 Hyperlipidemia, unspecified: Secondary | ICD-10-CM | POA: Diagnosis not present

## 2015-02-12 DIAGNOSIS — N183 Chronic kidney disease, stage 3 unspecified: Secondary | ICD-10-CM

## 2015-02-12 DIAGNOSIS — I2510 Atherosclerotic heart disease of native coronary artery without angina pectoris: Secondary | ICD-10-CM | POA: Diagnosis not present

## 2015-02-12 DIAGNOSIS — I669 Occlusion and stenosis of unspecified cerebral artery: Secondary | ICD-10-CM

## 2015-02-12 DIAGNOSIS — I10 Essential (primary) hypertension: Secondary | ICD-10-CM

## 2015-02-12 DIAGNOSIS — I517 Cardiomegaly: Secondary | ICD-10-CM

## 2015-02-12 NOTE — Patient Instructions (Signed)

## 2015-02-12 NOTE — Progress Notes (Signed)
Cardiology Office Note   Date:  02/12/2015   ID:  Daniel Cox, DOB 1946-01-31, MRN [id number]  PCP:  Daniel Neer, MD  Cardiologist:  Daniel Grooms, MD   Chief Complaint  Patient presents with  . Coronary Artery Disease      History of Present Illness: Daniel Cox is a 69 y.o. male who presents for CAD, LAD drug-eluting stent during non-ST elevation MI in 2014, hypertension, left ventricular hypertrophy, embolic CVA, and chronic back discomfort.  Mr. Haven is doing well. He feels he is recovered from his CVA and had an no difficulty. He is limited by back discomfort. He is limited by exertional dyspnea and chest discomfort. He is able to do yard work. He uses a push more but has to stop multiple times because of fatigue including dyspnea. He is able to walk a flight of stairs if he takes his time. He has not had to use any nitroglycerin.    Past Medical History  Diagnosis Date  . Chronic kidney disease   . Hyperlipidemia   . Erectile dysfunction   . Degenerative disc disease, cervical   . Degenerative lumbar disc     Past Surgical History  Procedure Laterality Date  . Right shoulder    . Appendectony    . Left heart catheterization with coronary angiogram N/A 02/06/2013    Procedure: LEFT HEART CATHETERIZATION WITH CORONARY ANGIOGRAM;  Surgeon: Daniel Grooms, MD;  Location: [facility] C[facility] LAB;  Service: Cardiovascular;  Laterality: N/A;  . Percutaneous coronary stent intervention (pci-s) N/A 02/10/2013    Procedure: PERCUTANEOUS CORONARY STENT INTERVENTION (PCI-S);  Surgeon: Daniel Grooms, MD;  Location: [facility] C[facility] LAB;  Service: Cardiovascular;  Laterality: N/A;     Current Outpatient Prescriptions  Medication Sig Dispense Refill  . aspirin 325 MG EC tablet Take 325 mg by mouth every morning.    . cholecalciferol (VITAMIN D) 1000 UNITS tablet Take 1,000 Units by mouth every morning.    . clindamycin (CLEOCIN) 150 MG capsule Take 1 capsule by mouth  daily.    . clopidogrel (PLAVIX) 75 MG tablet Take 1 tablet (75 mg total) by mouth daily with breakfast. 30 tablet 11  . cyclobenzaprine (FLEXERIL) 10 MG tablet Take 10 mg by mouth at bedtime as needed for muscle spasms.    . diclofenac (VOLTAREN) 75 MG EC tablet Take 1 tablet by mouth as needed.    Marland Kitchen HYDROcodone-acetaminophen (NORCO/VICODIN) 5-325 MG per tablet Take 1 tablet by mouth every 6 (six) hours as needed for pain.    . methocarbamol (ROBAXIN) 750 MG tablet Take 750 mg by mouth 2 (two) times daily as needed (for shoulder, neck, arm pain).    . metoprolol succinate (TOPROL-XL) 25 MG 24 hr tablet Take 25 mg by mouth every morning.    . Multiple Vitamins-Minerals (MULTIVITAMIN WITH MINERALS) tablet Take 1 tablet by mouth every morning.    . nitroGLYCERIN (NITROSTAT) 0.4 MG SL tablet Place 1 tablet (0.4 mg total) under the tongue every 5 (five) minutes x 3 doses as needed for chest pain. 25 tablet 12  . rosuvastatin (CRESTOR) 40 MG tablet Take 40 mg by mouth every morning.    . traZODone (DESYREL) 100 MG tablet Take 100 mg by mouth at bedtime.     No current facility-administered medications for this visit.    Allergies:   Penicillins    Social History:  The patient  reports that he has quit smoking. He does not have  any smokeless tobacco history on file. He reports that he does not drink alcohol or use illicit drugs.   Family History:  The patient's family history includes Colon cancer in his sister; Diabetes in his sister; Heart attack in his brother, brother, father, and mother; Heart disease in his sister.    ROS:  Please see the history of present illness.   Otherwise, review of systems are positive for headache, snoring, constipation, anxiety, dizziness, abdominal pain, constipation, and shortness of breath on exertion..   All other systems are reviewed and negative.    PHYSICAL EXAM: VS:  BP 118/78 mmHg  Pulse 64  Ht 5\' 8"  (1.727 m)  Wt 80.287 kg (177 lb)  BMI 26.92 kg/m2 ,  BMI Body mass index is 26.92 kg/(m^2). GEN: Well nourished, well developed, in no acute distress HEENT: normal Neck: no JVD, carotid bruits, or masses Cardiac: RRR; There is a right upper sternal border 1/6 systolic murmur and an S4 gallop; no pericardial rub, no edema . Dorsalis pedis is 2+ bilaterally. Respiratory:  clear to auscultation bilaterally, normal work of breathing GI: soft, nontender, nondistended, + BS MS: no deformity or atrophy Skin: warm and dry, no rash Neuro:  Strength and sensation are intact Psych: euthymic mood, full affect   EKG:  EKG is ordered today. The ekg ordered today demonstrates normal sinus rhythm and otherwise normal tracing.   Recent Labs: No results found for requested labs within last 365 days.    Lipid Panel    Component Value Date/Time   CHOL 128 02/07/2013 0458   TRIG 80 02/07/2013 0458   HDL 35* 02/07/2013 0458   CHOLHDL 3.7 02/07/2013 0458   VLDL 16 02/07/2013 0458   LDLCALC 77 02/07/2013 0458      Wt Readings from Last 3 Encounters:  02/12/15 80.287 kg (177 lb)  01/05/14 79.289 kg (174 lb 12.8 oz)  06/04/13 78.472 kg (173 lb)      Other studies Reviewed: Additional studies/ records that were reviewed today include: Pleated forms for [facility] disability. Review of the above records demonstrates:    ASSESSMENT AND PLAN:  Atherosclerosis of native coronary artery of native heart with stable angina pectoris - no nitroglycerin use   Chronic kidney disease (CKD), stage III (moderate)  Hyperlipidemia - followed by [facility]  Essential hypertension, benign - controlled   Moderate concentric hypertrophy related to hypertension.   Current medicines are reviewed at length with the patient today.  The patient does not have concerns regarding medicines.  The following changes have been made:  no change  Labs/ tests ordered today include:  No orders of the defined types were placed in this encounter.      Disposition:   FU with HS in 1 year  Signed, Daniel Grooms, MD  02/12/2015 10:01 AM    [address], [place], [place]  [address] Phone: [phone]; Fax: [phone]

## 2015-04-25 ENCOUNTER — Emergency Department (HOSPITAL_COMMUNITY)
Admission: EM | Admit: 2015-04-25 | Discharge: 2015-04-25 | Disposition: A | Discharge status: Home / Self Care | Payer: Medicare Other | Attending: Emergency Medicine | Admitting: Emergency Medicine

## 2015-04-25 ENCOUNTER — Emergency Department (HOSPITAL_COMMUNITY): Payer: Medicare Other

## 2015-04-25 ENCOUNTER — Encounter (HOSPITAL_COMMUNITY): Payer: Self-pay

## 2015-04-25 DIAGNOSIS — Z9861 Coronary angioplasty status: Secondary | ICD-10-CM | POA: Insufficient documentation

## 2015-04-25 DIAGNOSIS — Z7982 Long term (current) use of aspirin: Secondary | ICD-10-CM | POA: Insufficient documentation

## 2015-04-25 DIAGNOSIS — N189 Chronic kidney disease, unspecified: Secondary | ICD-10-CM | POA: Diagnosis not present

## 2015-04-25 DIAGNOSIS — Z9889 Other specified postprocedural states: Secondary | ICD-10-CM | POA: Insufficient documentation

## 2015-04-25 DIAGNOSIS — N200 Calculus of kidney: Secondary | ICD-10-CM | POA: Diagnosis not present

## 2015-04-25 DIAGNOSIS — Z880 Allergy status to penicillin: Secondary | ICD-10-CM | POA: Diagnosis not present

## 2015-04-25 DIAGNOSIS — R1032 Left lower quadrant pain: Secondary | ICD-10-CM

## 2015-04-25 DIAGNOSIS — Z79899 Other long term (current) drug therapy: Secondary | ICD-10-CM | POA: Insufficient documentation

## 2015-04-25 DIAGNOSIS — Z8739 Personal history of other diseases of the musculoskeletal system and connective tissue: Secondary | ICD-10-CM | POA: Diagnosis not present

## 2015-04-25 DIAGNOSIS — E785 Hyperlipidemia, unspecified: Secondary | ICD-10-CM | POA: Diagnosis not present

## 2015-04-25 DIAGNOSIS — N529 Male erectile dysfunction, unspecified: Secondary | ICD-10-CM | POA: Insufficient documentation

## 2015-04-25 DIAGNOSIS — Z87891 Personal history of nicotine dependence: Secondary | ICD-10-CM | POA: Insufficient documentation

## 2015-04-25 DIAGNOSIS — R319 Hematuria, unspecified: Secondary | ICD-10-CM

## 2015-04-25 LAB — COMPREHENSIVE METABOLIC PANEL
ALT: 30 U/L (ref 17–63)
ANION GAP: 6 (ref 5–15)
AST: 37 U/L (ref 15–41)
Albumin: 4 g/dL (ref 3.5–5.0)
Alkaline Phosphatase: 95 U/L (ref 38–126)
BUN: 20 mg/dL (ref 6–20)
CHLORIDE: 107 mmol/L (ref 101–111)
CO2: 25 mmol/L (ref 22–32)
Calcium: 9 mg/dL (ref 8.9–10.3)
Creatinine, Ser: 1.6 mg/dL — ABNORMAL HIGH (ref 0.61–1.24)
GFR, EST AFRICAN AMERICAN: 49 mL/min — AB (ref >60)
GFR, EST NON AFRICAN AMERICAN: 43 mL/min — AB (ref >60)
Glucose, Bld: 96 mg/dL (ref 65–99)
Potassium: 4.2 mmol/L (ref 3.5–5.1)
SODIUM: 138 mmol/L (ref 135–145)
Total Bilirubin: 0.7 mg/dL (ref 0.3–1.2)
Total Protein: 7.3 g/dL (ref 6.5–8.1)

## 2015-04-25 LAB — CBC WITH DIFFERENTIAL/PLATELET
Basophils Absolute: 0 10*3/uL (ref 0.0–0.1)
Basophils Relative: 0 % (ref 0–1)
EOS ABS: 0.3 10*3/uL (ref 0.0–0.7)
EOS PCT: 6 % — AB (ref 0–5)
HCT: 41.5 % (ref 39.0–52.0)
Hemoglobin: 14.2 g/dL (ref 13.0–17.0)
LYMPHS ABS: 2.6 10*3/uL (ref 0.7–4.0)
Lymphocytes Relative: 50 % — ABNORMAL HIGH (ref 12–46)
MCH: 34.3 pg — AB (ref 26.0–34.0)
MCHC: 34.2 g/dL (ref 30.0–36.0)
MCV: 100.2 fL — AB (ref 78.0–100.0)
MONO ABS: 0.4 10*3/uL (ref 0.1–1.0)
MONOS PCT: 7 % (ref 3–12)
Neutro Abs: 1.9 10*3/uL (ref 1.7–7.7)
Neutrophils Relative %: 37 % — ABNORMAL LOW (ref 43–77)
PLATELETS: 170 10*3/uL (ref 150–400)
RBC: 4.14 MIL/uL — ABNORMAL LOW (ref 4.22–5.81)
RDW: 12.6 % (ref 11.5–15.5)
WBC: 5.2 10*3/uL (ref 4.0–10.5)

## 2015-04-25 LAB — URINE MICROSCOPIC-ADD ON

## 2015-04-25 LAB — URINALYSIS, ROUTINE W REFLEX MICROSCOPIC
Bilirubin Urine: NEGATIVE
GLUCOSE, UA: NEGATIVE mg/dL
KETONES UR: NEGATIVE mg/dL
LEUKOCYTES UA: NEGATIVE
Nitrite: NEGATIVE
PROTEIN: NEGATIVE mg/dL
Specific Gravity, Urine: 1.021 (ref 1.005–1.030)
UROBILINOGEN UA: 1 mg/dL (ref 0.0–1.0)
pH: 5.5 (ref 5.0–8.0)

## 2015-04-25 LAB — LIPASE, BLOOD: LIPASE: 30 U/L (ref 22–51)

## 2015-04-25 MED ORDER — ONDANSETRON HCL 4 MG PO TABS: 4.0000 mg | ORAL_TABLET | Freq: Four times a day (QID) | ORAL | Status: DC

## 2015-04-25 MED ORDER — OXYCODONE-ACETAMINOPHEN 5-325 MG PO TABS: 2.0000 | ORAL_TABLET | ORAL | Status: DC | PRN

## 2015-04-25 MED ORDER — ONDANSETRON HCL 4 MG/2ML IJ SOLN
4.0000 mg | Freq: Once | INTRAMUSCULAR | Status: AC
  Administered 2015-04-25: 4 mg via INTRAVENOUS
  Filled 2015-04-25: qty 2

## 2015-04-25 MED ORDER — TAMSULOSIN HCL 0.4 MG PO CAPS: 0.4000 mg | ORAL_CAPSULE | Freq: Every day | ORAL | Status: DC

## 2015-04-25 NOTE — ED Notes (Signed)
Pt states flank pain radiating to left back.  Pt pain started on Friday.  Difficult going to bathroom.  No blood in urine noted.  Does have hx of kidney stone years ago.  No vomiting

## 2015-04-25 NOTE — ED Notes (Signed)
Awake. Verbally responsive. A/O x4 and watching TV. Resp even and unlabored. No audible adventitious breath sounds noted. ABC's intact. IV saline lock patent and intact.

## 2015-04-25 NOTE — ED Provider Notes (Signed)
CSN: [id number]     Arrival date & time 04/25/15  1001 History   First MD Initiated Contact with Patient 04/25/15 1024     Chief Complaint  Patient presents with  . Flank Pain      HPI Pt here for LLQ abdominal pain. Patient c/o nausea. Denies vomiting and diarrhea. LLQ tenderness. Patient states pain is a 7/10. Last BM 8/25. Bowel sounds present. Pt states he is urinating without pain, but reports it is not as much as he usually urinates.  Past Medical History  Diagnosis Date  . Chronic kidney disease   . Hyperlipidemia   . Erectile dysfunction   . Degenerative disc disease, cervical   . Degenerative lumbar disc    Past Surgical History  Procedure Laterality Date  . Right shoulder    . Appendectony    . Left heart catheterization with coronary angiogram N/A 02/06/2013    Procedure: LEFT HEART CATHETERIZATION WITH CORONARY ANGIOGRAM;  Surgeon: Sinclair Grooms, MD;  Location: [facility] CATH LAB;  Service: Cardiovascular;  Laterality: N/A;  . Percutaneous coronary stent intervention (pci-s) N/A 02/10/2013    Procedure: PERCUTANEOUS CORONARY STENT INTERVENTION (PCI-S);  Surgeon: Sinclair Grooms, MD;  Location: [facility] CATH LAB;  Service: Cardiovascular;  Laterality: N/A;   Family History  Problem Relation Age of Onset  . Heart attack Mother   . Heart attack Father   . Heart disease Sister   . Heart attack Brother   . Heart attack Brother   . Diabetes Sister   . Colon cancer Sister    Social History  Substance Use Topics  . Smoking status: Former Smoker -- 0.50 packs/day for 30 years  . Smokeless tobacco: None     Comment: 2 weeks ago from 06-04-13  . Alcohol Use: No    Review of Systems  Gastrointestinal: Positive for nausea and abdominal pain. Negative for vomiting, constipation, blood in stool and abdominal distention.  All other systems reviewed and are negative.     Allergies  Penicillins  Home Medications   Prior to Admission medications   Medication Sig Start Date  End Date Taking? Authorizing Provider  acetaminophen (TYLENOL) 500 MG tablet Take 500 mg by mouth every 6 (six) hours as needed for mild pain, moderate pain, fever or headache.   Yes Historical Provider, MD  aspirin 325 MG EC tablet Take 325 mg by mouth every morning.   Yes Historical Provider, MD  atorvastatin (LIPITOR) 40 MG tablet Take 40 mg by mouth daily.   Yes Historical Provider, MD  cholecalciferol (VITAMIN D) 1000 UNITS tablet Take 1,000 Units by mouth every morning.   Yes Historical Provider, MD  docusate sodium (COLACE) 100 MG capsule Take 100-200 mg by mouth daily.   Yes Historical Provider, MD  metoprolol succinate (TOPROL-XL) 25 MG 24 hr tablet Take 25 mg by mouth every morning.   Yes Historical Provider, MD  Multiple Vitamins-Minerals (MULTIVITAMIN WITH MINERALS) tablet Take 1 tablet by mouth every morning.   Yes Historical Provider, MD  nitroGLYCERIN (NITROSTAT) 0.4 MG SL tablet Place 1 tablet (0.4 mg total) under the tongue every 5 (five) minutes x 3 doses as needed for chest pain. 02/11/13  Yes Belva Crome, MD  sertraline (ZOLOFT) 25 MG tablet Take 25 mg by mouth at bedtime.   Yes Historical Provider, MD  ondansetron (ZOFRAN) 4 MG tablet Take 1 tablet (4 mg total) by mouth every 6 (six) hours. 04/25/15   Leonard Schwartz, MD  oxyCODONE-acetaminophen (PERCOCET/ROXICET) [phone]  MG per tablet Take 2 tablets by mouth every 4 (four) hours as needed for severe pain. 04/25/15   Leonard Schwartz, MD  tamsulosin (FLOMAX) 0.4 MG CAPS capsule Take 1 capsule (0.4 mg total) by mouth daily. 04/25/15   Leonard Schwartz, MD   BP 123/91 mmHg  Pulse 60  Temp(Src) 97.8 F (36.6 C) (Oral)  SpO2 98% Physical Exam  Constitutional: He is oriented to person, place, and time. He appears well-developed and well-nourished. No distress.  HENT:  Head: Normocephalic and atraumatic.  Eyes: Pupils are equal, round, and reactive to light.  Neck: Normal range of motion.  Cardiovascular: Normal rate and intact distal  pulses.   Pulmonary/Chest: No respiratory distress.  Abdominal: Normal appearance. He exhibits no distension. There is tenderness.    Musculoskeletal: Normal range of motion.  Neurological: He is alert and oriented to person, place, and time. No cranial nerve deficit.  Skin: Skin is warm and dry. No rash noted.  Psychiatric: He has a normal mood and affect. His behavior is normal.  Nursing note and vitals reviewed.   ED Course  Procedures (including critical care time) Medications  ondansetron (ZOFRAN) injection 4 mg (4 mg Intravenous Given 04/25/15 1107)    Medications  ondansetron (ZOFRAN) injection 4 mg (4 mg Intravenous Given 04/25/15 1107)    Labs Review Labs Reviewed  CBC WITH DIFFERENTIAL/PLATELET - Abnormal; Notable for the following:    RBC 4.14 (*)    MCV 100.2 (*)    MCH 34.3 (*)    Neutrophils Relative % 37 (*)    Lymphocytes Relative 50 (*)    Eosinophils Relative 6 (*)    All other components within normal limits  COMPREHENSIVE METABOLIC PANEL - Abnormal; Notable for the following:    Creatinine, Ser 1.60 (*)    GFR calc non Af Amer 43 (*)    GFR calc Af Amer 49 (*)    All other components within normal limits  URINALYSIS, ROUTINE W REFLEX MICROSCOPIC (NOT AT [facility]) - Abnormal; Notable for the following:    APPearance CLOUDY (*)    Hgb urine dipstick LARGE (*)    All other components within normal limits  LIPASE, BLOOD  URINE MICROSCOPIC-ADD ON    Imaging Review Ct Renal Stone Study  04/25/2015   CLINICAL DATA:  Left-sided flank pain.  Hematuria.  EXAM: CT ABDOMEN AND PELVIS WITHOUT CONTRAST  TECHNIQUE: Multidetector CT imaging of the abdomen and pelvis was performed following the standard protocol without IV contrast.  COMPARISON:  09/27/2004; report not available.  FINDINGS: Lower chest: Bibasilar atelectasis. Mild cardiomegaly, without pericardial or pleural effusion. Right coronary artery atherosclerosis.  Hepatobiliary: Normal liver. Normal gallbladder,  without biliary ductal dilatation. Vascular calcifications are identified posterior to the common duct.  Pancreas: Normal, without mass or ductal dilatation.  Spleen: Normal  Adrenals/Urinary Tract: Normal adrenal glands. An upper pole right renal fluid density lesion is likely a cyst. Punctate interpolar left renal calculus. Mild left-sided hydroureteronephrosis. This continues to the level of a 2 mm stone at the left ureterovesicular junction. Vascular calcifications, without right ureteric stone.  Stomach/Bowel: Proximal gastric underdistention. Probable appendectomy. Normal colon and terminal ileum. Normal small bowel.  Vascular/Lymphatic: Aortic and branch vessel atherosclerosis. No abdominopelvic adenopathy.  Reproductive: Normal prostate.  Other: No significant free fluid.  Musculoskeletal: Degenerative partial fusion of the right worse than left sacroiliac joints. Transitional L5 vertebral body  IMPRESSION: 1. Mild left-sided hydroureteronephrosis secondary to a 2 mm stone at the left ureterovesicular junction. 2. Left  nephrolithiasis. 3.  Atherosclerosis, including within the coronary arteries.   Electronically Signed   By: Abigail Miyamoto M.D.   On: 04/25/2015 13:48   I have personally reviewed and evaluated these images and lab results as part of my medical decision-making.    MDM   Final diagnoses:  LLQ pain  Hematuria  Kidney stone        Leonard Schwartz, MD 04/25/15 1415

## 2015-04-25 NOTE — Discharge Instructions (Signed)

## 2015-04-25 NOTE — ED Notes (Signed)
Awake. Verbally responsive. A/O x4. Resp even and unlabored. No audible adventitious breath sounds noted. ABC's intact. IV saline lock patent and intact. Pt explained delayed.

## 2015-04-25 NOTE — ED Notes (Addendum)
Pt here for LLQ abdominal pain. Patient c/o nausea. Denies vomiting and diarrhea. LLQ tenderness. Patient states pain is a 7/10. Last BM 8/25. Bowel sounds present. Pt states he is urinating without pain, but reports it is not as much as he usually urinates.

## 2015-04-25 NOTE — ED Notes (Signed)
Awake. Verbally responsive. A/O x4. Resp even and unlabored. No audible adventitious breath sounds noted. ABC's intact.  

## 2015-04-25 NOTE — ED Notes (Signed)
Pt to CT scan and returned to room without distress noted.

## 2015-06-14 ENCOUNTER — Other Ambulatory Visit: Payer: Self-pay | Admitting: Family Medicine

## 2015-06-14 DIAGNOSIS — R945 Abnormal results of liver function studies: Principal | ICD-10-CM

## 2015-06-14 DIAGNOSIS — R7989 Other specified abnormal findings of blood chemistry: Secondary | ICD-10-CM

## 2015-06-16 ENCOUNTER — Other Ambulatory Visit: Payer: Medicare Other

## 2015-06-17 ENCOUNTER — Other Ambulatory Visit: Payer: Medicare Other

## 2015-06-17 ENCOUNTER — Ambulatory Visit
Admission: RE | Admit: 2015-06-17 | Discharge: 2015-06-17 | Disposition: A | Discharge status: Home / Self Care | Payer: Medicare Other | Source: Ambulatory Visit | Attending: Family Medicine | Admitting: Family Medicine

## 2015-06-17 DIAGNOSIS — R7989 Other specified abnormal findings of blood chemistry: Secondary | ICD-10-CM

## 2015-06-17 DIAGNOSIS — R945 Abnormal results of liver function studies: Principal | ICD-10-CM

## 2015-06-18 ENCOUNTER — Other Ambulatory Visit: Payer: Medicare Other

## 2015-06-30 ENCOUNTER — Other Ambulatory Visit: Payer: Self-pay | Admitting: Gastroenterology

## 2015-08-13 NOTE — Progress Notes (Signed)
08-13-15 1230 Voice messages x6 to pt with no return call or contact to [facility] PAT Staff. Pt. Will need to be instructed per office, if having procedure.

## 2015-08-16 ENCOUNTER — Ambulatory Visit (HOSPITAL_COMMUNITY): Payer: Medicare Other | Admitting: Anesthesiology

## 2015-08-16 ENCOUNTER — Encounter (HOSPITAL_COMMUNITY)
Admission: RE | Disposition: A | Discharge status: Home / Self Care | Payer: Self-pay | Source: Ambulatory Visit | Attending: Gastroenterology

## 2015-08-16 ENCOUNTER — Ambulatory Visit (HOSPITAL_COMMUNITY)
Admission: RE | Admit: 2015-08-16 | Discharge: 2015-08-16 | Disposition: A | Discharge status: Home / Self Care | Payer: Medicare Other | Source: Ambulatory Visit | Attending: Gastroenterology | Admitting: Gastroenterology

## 2015-08-16 ENCOUNTER — Encounter (HOSPITAL_COMMUNITY): Payer: Self-pay

## 2015-08-16 DIAGNOSIS — E7800 Pure hypercholesterolemia, unspecified: Secondary | ICD-10-CM | POA: Diagnosis not present

## 2015-08-16 DIAGNOSIS — Z955 Presence of coronary angioplasty implant and graft: Secondary | ICD-10-CM | POA: Diagnosis not present

## 2015-08-16 DIAGNOSIS — I10 Essential (primary) hypertension: Secondary | ICD-10-CM | POA: Diagnosis not present

## 2015-08-16 DIAGNOSIS — M1990 Unspecified osteoarthritis, unspecified site: Secondary | ICD-10-CM | POA: Diagnosis not present

## 2015-08-16 DIAGNOSIS — Z1211 Encounter for screening for malignant neoplasm of colon: Secondary | ICD-10-CM | POA: Insufficient documentation

## 2015-08-16 DIAGNOSIS — D123 Benign neoplasm of transverse colon: Secondary | ICD-10-CM | POA: Diagnosis not present

## 2015-08-16 DIAGNOSIS — Z87891 Personal history of nicotine dependence: Secondary | ICD-10-CM | POA: Insufficient documentation

## 2015-08-16 DIAGNOSIS — Z86010 Personal history of colonic polyps: Secondary | ICD-10-CM | POA: Insufficient documentation

## 2015-08-16 DIAGNOSIS — I2510 Atherosclerotic heart disease of native coronary artery without angina pectoris: Secondary | ICD-10-CM | POA: Diagnosis not present

## 2015-08-16 DIAGNOSIS — I739 Peripheral vascular disease, unspecified: Secondary | ICD-10-CM | POA: Insufficient documentation

## 2015-08-16 DIAGNOSIS — Z8673 Personal history of transient ischemic attack (TIA), and cerebral infarction without residual deficits: Secondary | ICD-10-CM | POA: Insufficient documentation

## 2015-08-16 HISTORY — PX: COLONOSCOPY WITH PROPOFOL: SHX5780

## 2015-08-16 SURGERY — COLONOSCOPY WITH PROPOFOL
Anesthesia: Monitor Anesthesia Care

## 2015-08-16 MED ORDER — PROPOFOL 10 MG/ML IV BOLUS
INTRAVENOUS | Status: AC
  Filled 2015-08-16: qty 40

## 2015-08-16 MED ORDER — LIDOCAINE HCL (CARDIAC) 20 MG/ML IV SOLN
INTRAVENOUS | Status: AC
  Filled 2015-08-16: qty 5

## 2015-08-16 MED ORDER — PROPOFOL 500 MG/50ML IV EMUL
INTRAVENOUS | Status: DC | PRN
  Administered 2015-08-16: 120 ug/kg/min via INTRAVENOUS

## 2015-08-16 MED ORDER — SODIUM CHLORIDE 0.9 % IV SOLN: INTRAVENOUS | Status: DC

## 2015-08-16 MED ORDER — LACTATED RINGERS IV SOLN
INTRAVENOUS | Status: DC
  Administered 2015-08-16: 1000 mL via INTRAVENOUS

## 2015-08-16 MED ORDER — PROPOFOL 500 MG/50ML IV EMUL
INTRAVENOUS | Status: DC | PRN
  Administered 2015-08-16: 50 mg via INTRAVENOUS

## 2015-08-16 SURGICAL SUPPLY — 22 items

## 2015-08-16 NOTE — H&P (Signed)
  Procedure: Surveillance colonoscopy. 01/04/2010 colonoscopy performed with removal of adenomatous colon polyps  History: The patient is a 69 year old male born 1946-05-02. He is scheduled to undergo a surveillance colonoscopy today.  Past medical history: Hypercholesterolemia. Acute coronary syndrome with placement of a drug eluting coronary stent in the left anterior descending coronary artery in June 2014. Embolic stroke diagnosed in June [id number] as a complication of diagnostic coronary angiography. Appendectomy. Right shoulder surgery. Cervical disc surgery.  Medication allergies: Penicillin  Exam: The patient is alert and lying comfortably on the endoscopy stretcher. Abdomen is soft and nontender to palpation. Lungs are clear to auscultation. Cardiac exam reveals a regular rhythm.  Plan: Proceed with surveillance colonoscopy

## 2015-08-16 NOTE — Op Note (Signed)
Procedure: Surveillance colonoscopy. Adenomatous colon polyps removed colonoscopically in the past  Endoscopist: Earle Gell  Premedication: Propofol administered by anesthesia  Procedure: The patient was placed in the left lateral decubitus position. Anal inspection and digital rectal exam were normal. The Pentax pediatric colonoscope was introduced into the rectum and advanced to the cecum. A normal-appearing ileocecal valve and appendiceal R if this were identified. Colonic preparation for the exam today was good. Withdrawal time was 11 minutes  Rectum. Normal. Retroflexed view of the distal rectum was normal  Sigmoid colon and descending colon. Normal  Splenic flexure. Normal  Transverse colon. From the proximal transverse colon, two 3 mm sessile polyps were removed with the cold biopsy forceps  Hepatic flexure. Normal  Ascending colon.  Cecum and ileocecal valve. Normal  Assessment: Two diminutive sessile polyps were removed from the proximal transverse colon. Otherwise normal surveillance colonoscopy.  Recommendation: Schedule repeat surveillance colonoscopy in 5 years

## 2015-08-16 NOTE — Discharge Instructions (Signed)
Monitored Anesthesia Care Monitored anesthesia care is an anesthesia service for a medical procedure. Anesthesia is the loss of the ability to feel pain. It is produced by medicines called anesthetics. It may affect a small area of your body (local anesthesia), a large area of your body (regional anesthesia), or your entire body (general anesthesia). The need for monitored anesthesia care depends your procedure, your condition, and the potential need for regional or general anesthesia. It is often provided during procedures where:   General anesthesia may be needed if there are complications. This is because you need special care when you are under general anesthesia.   You will be under local or regional anesthesia. This is so that you are able to have higher levels of anesthesia if needed.   You will receive calming medicines (sedatives). This is especially the case if sedatives are given to put you in a semi-conscious state of relaxation (deep sedation). This is because the amount of sedative needed to produce this state can be hard to predict. Too much of a sedative can produce general anesthesia. Monitored anesthesia care is performed by one or more health care providers who have special training in all types of anesthesia. You will need to meet with these health care providers before your procedure. During this meeting, they will ask you about your medical history. They will also give you instructions to follow. (For example, you will need to stop eating and drinking before your procedure. You may also need to stop or change medicines you are taking.) During your procedure, your health care providers will stay with you. They will:   Watch your condition. This includes watching your blood pressure, breathing, and level of pain.   Diagnose and treat problems that occur.   Give medicines if they are needed. These may include calming medicines (sedatives) and anesthetics.   Make sure you are  comfortable.  Having monitored anesthesia care does not necessarily mean that you will be under anesthesia. It does mean that your health care providers will be able to manage anesthesia if you need it or if it occurs. It also means that you will be able to have a different type of anesthesia than you are having if you need it. When your procedure is complete, your health care providers will continue to watch your condition. They will make sure any medicines wear off before you are allowed to go home.    This information is not intended to replace advice given to you by your health care provider. Make sure you discuss any questions you have with your health care provider.   Document Released: 05/10/2005 Document Revised: 09/04/2014 Document Reviewed: 09/25/2012 Elsevier Interactive Patient Education 2016 [employer] Colonoscopy, Care After Refer to this sheet in the next few weeks. These instructions provide you with information on caring for yourself after your procedure. Your health care provider may also give you more specific instructions. Your treatment has been planned according to current medical practices, but problems sometimes occur. Call your health care provider if you have any problems or questions after your procedure. WHAT TO EXPECT AFTER THE PROCEDURE  After your procedure, it is typical to have the following:  A small amount of blood in your stool.  Moderate amounts of gas and mild abdominal cramping or bloating. HOME CARE INSTRUCTIONS  Do not drive, operate machinery, or sign important documents for 24 hours.  You may shower and resume your regular physical activities, but move at a slower pace for  the first 24 hours.  Take frequent rest periods for the first 24 hours.  Walk around or put a warm pack on your abdomen to help reduce abdominal cramping and bloating.  Drink enough fluids to keep your urine clear or pale yellow.  You may resume your normal diet as  instructed by your health care provider. Avoid heavy or fried foods that are hard to digest.  Avoid drinking alcohol for 24 hours or as instructed by your health care provider.  Only take over-the-counter or prescription medicines as directed by your health care provider.  If a tissue sample (biopsy) was taken during your procedure:  Do not take aspirin or blood thinners for 7 days, or as instructed by your health care provider.  Do not drink alcohol for 7 days, or as instructed by your health care provider.  Eat soft foods for the first 24 hours. SEEK MEDICAL CARE IF: You have persistent spotting of blood in your stool 2-3 days after the procedure. SEEK IMMEDIATE MEDICAL CARE IF:  You have more than a small spotting of blood in your stool.  You pass large blood clots in your stool.  Your abdomen is swollen (distended).  You have nausea or vomiting.  You have a fever.  You have increasing abdominal pain that is not relieved with medicine.   This information is not intended to replace advice given to you by your health care provider. Make sure you discuss any questions you have with your health care provider.   Document Released: 03/28/2004 Document Revised: 06/04/2013 Document Reviewed: 04/21/2013 Elsevier Interactive Patient Education [employer].

## 2015-08-16 NOTE — Anesthesia Postprocedure Evaluation (Signed)
Anesthesia Post Note  Patient: Daniel Cox  Procedure(s) Performed: Procedure(s) (LRB): COLONOSCOPY WITH PROPOFOL (N/A)  Patient location during evaluation: PACU Anesthesia Type: MAC Level of consciousness: awake and alert Pain management: pain level controlled Vital Signs Assessment: post-procedure vital signs reviewed and stable Respiratory status: spontaneous breathing, nonlabored ventilation, respiratory function stable and patient connected to nasal cannula oxygen Cardiovascular status: stable and blood pressure returned to baseline Anesthetic complications: no    Last Vitals:  Filed Vitals:   08/16/15 1022  BP: 127/80  Pulse: 63  Temp: 36.6 C  Resp: 23    Last Pain: There were no vitals filed for this visit.               Effie Berkshire

## 2015-08-16 NOTE — Transfer of Care (Signed)
Immediate Anesthesia Transfer of Care Note  Patient: Daniel Cox  Procedure(s) Performed: Procedure(s): COLONOSCOPY WITH PROPOFOL (N/A)  Patient Location: PACU  Anesthesia Type:MAC  Level of Consciousness: awake, alert  and oriented  Airway & Oxygen Therapy: Patient Spontanous Breathing and Patient connected to face mask oxygen  Post-op Assessment: Report given to RN and Post -op Vital signs reviewed and stable  Post vital signs: Reviewed and stable  Last Vitals:  Filed Vitals:   08/16/15 1022  BP: 127/80  Pulse: 63  Temp: 36.6 C  Resp: 23    Complications: No apparent anesthesia complications

## 2015-08-16 NOTE — Anesthesia Preprocedure Evaluation (Addendum)
Anesthesia Evaluation  Patient identified by MRN, date of birth, ID band Patient awake    Reviewed: Allergy & Precautions, NPO status , Patient's Chart, lab work & pertinent test results, reviewed documented beta blocker date and time   Airway Mallampati: II  TM Distance: >3 FB Neck ROM: Full    Dental  (+) Partial Upper, Dental Advisory Given   Pulmonary former smoker,    breath sounds clear to auscultation       Cardiovascular hypertension, Pt. on medications and Pt. on home beta blockers + CAD, + Cardiac Stents and + Peripheral Vascular Disease   Rhythm:Regular Rate:Normal  DES   Neuro/Psych negative neurological ROS  negative psych ROS   GI/Hepatic Neg liver ROS,   Endo/Other  negative endocrine ROS  Renal/GU CRFRenal disease  negative genitourinary   Musculoskeletal  (+) Arthritis ,   Abdominal   Peds negative pediatric ROS (+)  Hematology negative hematology ROS (+)   Anesthesia Other Findings   Reproductive/Obstetrics                           Lab Results  Component Value Date   WBC 5.2 04/25/2015   HGB 14.2 04/25/2015   HCT 41.5 04/25/2015   MCV 100.2* 04/25/2015   PLT 170 04/25/2015   Lab Results  Component Value Date   CREATININE 1.60* 04/25/2015   BUN 20 04/25/2015   NA 138 04/25/2015   K 4.2 04/25/2015   CL 107 04/25/2015   CO2 25 04/25/2015   Lab Results  Component Value Date   INR 1.04 02/06/2013   INR 1.00 02/06/2013   01/2015: EKG: normal sinus rhythm.   Anesthesia Physical Anesthesia Plan  ASA: III  Anesthesia Plan: MAC   Post-op Pain Management:    Induction: Intravenous  Airway Management Planned: Natural Airway and Nasal Cannula  Additional Equipment:   Intra-op Plan:   Post-operative Plan:   Informed Consent: I have reviewed the patients History and Physical, chart, labs and discussed the procedure including the risks, benefits and  alternatives for the proposed anesthesia with the patient or authorized representative who has indicated his/her understanding and acceptance.     Plan Discussed with: CRNA  Anesthesia Plan Comments:         Anesthesia Quick Evaluation

## 2015-08-17 ENCOUNTER — Encounter (HOSPITAL_COMMUNITY): Payer: Self-pay | Admitting: Gastroenterology

## 2015-12-02 ENCOUNTER — Encounter: Payer: Self-pay | Admitting: Interventional Cardiology

## 2016-05-24 ENCOUNTER — Ambulatory Visit: Payer: Medicare Other | Attending: Anesthesiology

## 2016-05-24 DIAGNOSIS — M542 Cervicalgia: Secondary | ICD-10-CM | POA: Diagnosis not present

## 2016-05-24 DIAGNOSIS — M545 Low back pain, unspecified: Secondary | ICD-10-CM

## 2016-05-24 DIAGNOSIS — R252 Cramp and spasm: Secondary | ICD-10-CM | POA: Diagnosis present

## 2016-05-24 DIAGNOSIS — M25611 Stiffness of right shoulder, not elsewhere classified: Secondary | ICD-10-CM | POA: Insufficient documentation

## 2016-05-24 DIAGNOSIS — M6281 Muscle weakness (generalized): Secondary | ICD-10-CM | POA: Diagnosis present

## 2016-05-24 DIAGNOSIS — R293 Abnormal posture: Secondary | ICD-10-CM | POA: Diagnosis present

## 2016-05-24 NOTE — Therapy (Signed)
[place], [place], [address] Phone: [phone]   Fax:  [phone]  Physical Therapy Evaluation  Patient Details  Name: Daniel Cox MRN: [id number] Date of Birth: 04-07-1946 Referring Provider: Angie Fava, MD  Encounter Date: 05/24/2016      PT End of Session - 05/24/16 1014    Visit Number 1   Number of Visits 12   Date for PT Re-Evaluation 07/07/16   Authorization Type [facility] Medicare   PT Start Time 0930   PT Stop Time [id number]   PT Time Calculation (min) 45 min   Activity Tolerance Patient tolerated treatment well   Behavior During Therapy [facility] for tasks assessed/performed      Past Medical History:  Diagnosis Date  . Chronic kidney disease   . Degenerative disc disease, cervical   . Degenerative lumbar disc   . Erectile dysfunction   . Hyperlipidemia     Past Surgical History:  Procedure Laterality Date  . Appendectony    . COLONOSCOPY WITH PROPOFOL N/A 08/16/2015   Procedure: COLONOSCOPY WITH PROPOFOL;  Surgeon: Garlan Fair, MD;  Location: [facility];  Service: Endoscopy;  Laterality: N/A;  . LEFT HEART CATHETERIZATION WITH CORONARY ANGIOGRAM N/A 02/06/2013   Procedure: LEFT HEART CATHETERIZATION WITH CORONARY ANGIOGRAM;  Surgeon: Sinclair Grooms, MD;  Location: [facility] CATH LAB;  Service: Cardiovascular;  Laterality: N/A;  . PERCUTANEOUS CORONARY STENT INTERVENTION (PCI-S) N/A 02/10/2013   Procedure: PERCUTANEOUS CORONARY STENT INTERVENTION (PCI-S);  Surgeon: Sinclair Grooms, MD;  Location: [facility] CATH LAB;  Service: Cardiovascular;  Laterality: N/A;  . Right shoulder      There were no vitals filed for this visit.       Subjective Assessment - 05/24/16 0938    Subjective He is having alot of back neck and arms. He reports pains onset 1970 post [employer].  He has had PT in past. He did not have lasting effect for past therapy. He is now in pain management and therapy is part of plan.  He  does not do much activity due to incr pain/cramping up.    Pertinent History Wrist and elbow and RTC repair  and cervical fusion (2015)  Still has pain in neck and ROM decreased post but  better. He has had injections in spine x1 with some benefit   Limitations House hold activities  any activity incr pain   How long can you sit comfortably? 2 hours with adjustments   How long can you stand comfortably? 30 min   How long can you walk comfortably? 2-3 blocks   Diagnostic tests none   Patient Stated Goals Get rid of pain and improve neck motion   Currently in Pain? Yes   Pain Score 5    Pain Location Neck   Pain Orientation Posterior  midline   Pain Descriptors / Indicators Aching   Pain Type Chronic pain   Pain Onset More than a month ago   Pain Frequency Constant   Aggravating Factors  Activity   Pain Relieving Factors Nothing    Multiple Pain Sites Yes   Pain Score 6   Pain Location Back   Pain Orientation Posterior  central  from below scapula to sacrum   Pain Descriptors / Indicators Aching;Sharp   Pain Type Chronic pain   Pain Frequency Constant   Aggravating Factors  activity   Pain Relieving Factors nothing            [facility] PT  Assessment - 05/24/16 0001      Assessment   Referring Provider [place], MD     Precautions   Precautions None     Restrictions   Weight Bearing Restrictions No     Balance Screen   Has the patient fallen in the past 6 months No   Has the patient had a decrease in activity level because of a fear of falling?  No   Is the patient reluctant to leave their home because of a fear of falling?  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 assessed     Observation/Other Assessments   Focus on Therapeutic Outcomes (FOTO)  54% limited     Posture/Postural Control   Posture Comments forward head      ROM /  Strength   AROM / PROM / Strength AROM;Strength     AROM   Overall AROM Comments RT elbow ext -20 degrees flexion WNL,, Decr act and passive RT shoulder motion    AROM Assessment Site Cervical;Lumbar   Cervical Flexion 32   Cervical Extension 30   Cervical - Right Side Bend 28   Cervical - Left Side Bend 32   Cervical - Right Rotation 32   Cervical - Left Rotation 15   Lumbar Flexion 20   Lumbar Extension 8   Lumbar - Right Side Bend 15   Lumbar - Left Side Bend 15     Strength   Overall Strength Comments UE WFl . He gave less effort on Rt and he may have some residual RTC weakness post surgery.        Palpation   Spinal mobility RT cervical ROM passive > activ but LT rotation min to no better passively   Palpation comment tender cervical paraspinals most LT upper cervical spine     Ambulation/Gait   Gait Comments No device WNL                           PT Education - 05/24/16 1016    Education provided Yes   Education Details POC   Person(s) Educated Patient   Methods Explanation   Comprehension Verbalized understanding          PT Short Term Goals - 05/24/16 1012      PT SHORT TERM GOAL #1   Title He will report understanding of good posture   Time 3   Period Weeks   Status New     PT SHORT TERM GOAL #2   Title He will be independent with initial HEP   Time 3   Period Weeks   Status New     PT SHORT TERM GOAL #3   Title He will report 20% decreased in neck pain with increase to 30 degrees LT cervical rotation   Time 3   Period Weeks   Status New           PT Long Term Goals - 05/24/16 1013      PT LONG TERM GOAL #1   Title He will be independent with all HEP issued   Time 6   Period Weeks   Status New     PT LONG TERM GOAL #2   Title He will report pain in neck decr 50% or  more with normal home taske   Time 6   Period Weeks   Status New     PT LONG TERM GOAL #3   Title Lumbar flexion will improve 10 degrees or more  to demo incr flexibility   Time 6   Period Weeks   Status New     PT LONG TERM GOAL #4   Title He will report 25% decr in back pain.    Time 6   Period Weeks   Status New               Plan - 05/24/16 1015    Clinical Impression Statement Mr [place] presents for moderate complexity eval with mutile areas of complaint and chronic nature of symptoms. He demos decr ROM in spine and RT shoulder , spasm, weakness of RT arm, abnormal posture, decreased tolerance to activity needing assistance for some selfcare activity.  He has had PT in past withlimited benefit so will do 3-4 week trial to see if any improvement can occur.    Rehab Potential Fair   PT Frequency 2x / week   PT Duration 6 weeks   PT Treatment/Interventions Cryotherapy;Electrical Stimulation;Iontophoresis 4mg /ml Dexamethasone;Moist Heat;Traction;Ultrasound;Patient/family education;Passive range of motion;Manual techniques;Taping;Therapeutic exercise;Dry needling   PT Next Visit Plan Initiate HEP stretching, manual , modalities, posture, ass hamstring and hip motion /LE strength   Consulted and Agree with Plan of Care Patient      Patient will benefit from skilled therapeutic intervention in order to improve the following deficits and impairments:  Postural dysfunction, Decreased strength, Pain, Impaired UE functional use, Increased muscle spasms, Decreased range of motion, Decreased activity tolerance  Visit Diagnosis: Cervicalgia - Plan: PT plan of care cert/re-cert  Midline low back pain without sciatica - Plan: PT plan of care cert/re-cert  Muscle weakness (generalized) - Plan: PT plan of care cert/re-cert  Stiffness of right shoulder, not elsewhere classified - Plan: PT plan of care cert/re-cert  Abnormal posture - Plan: PT plan of care cert/re-cert  Cramp and spasm - Plan: PT plan of care cert/re-cert      G-Codes - [id number]    Functional Assessment Tool Used FOTO 54%   Functional Limitation  Mobility: Walking and moving around   Mobility: Walking and Moving Around Current Status [phone]) At least 40 percent but less than 60 percent impaired, limited or restricted   Mobility: Walking and Moving Around Goal Status [phone]) At least 20 percent but less than 40 percent impaired, limited or restricted       Problem List Patient Active Problem List   Diagnosis Date Noted  . Left ventricular hypertrophy 02/12/2015  . Coronary atherosclerosis of native coronary artery 06/04/2013  . Essential hypertension, benign 06/04/2013  . Cerebral embolism 02/07/2013    Class: Acute  . Acute coronary syndrome ([place]) 02/04/2013    Class: Acute  . BPH (benign prostatic hypertrophy) 02/04/2013    Class: Chronic  . Chronic kidney disease (CKD), stage III (moderate) 02/04/2013  . Hyperlipidemia 02/04/2013  . Erectile dysfunction 02/04/2013    Class: Chronic    Darrel Hoover  PT 05/24/2016, 10:40 AM  [facility] [address] [place], [place], [address] Phone: [phone]   Fax:  [phone]  Name: Daniel Cox MRN: [id number] Date of Birth: 10-20-1945

## 2016-05-29 ENCOUNTER — Ambulatory Visit: Payer: Medicare Other | Attending: Anesthesiology | Admitting: Physical Therapy

## 2016-05-29 DIAGNOSIS — M25611 Stiffness of right shoulder, not elsewhere classified: Secondary | ICD-10-CM | POA: Diagnosis present

## 2016-05-29 DIAGNOSIS — M545 Low back pain: Secondary | ICD-10-CM | POA: Diagnosis present

## 2016-05-29 DIAGNOSIS — R252 Cramp and spasm: Secondary | ICD-10-CM | POA: Diagnosis present

## 2016-05-29 DIAGNOSIS — M6281 Muscle weakness (generalized): Secondary | ICD-10-CM | POA: Insufficient documentation

## 2016-05-29 DIAGNOSIS — G8929 Other chronic pain: Secondary | ICD-10-CM | POA: Diagnosis present

## 2016-05-29 DIAGNOSIS — M542 Cervicalgia: Secondary | ICD-10-CM | POA: Diagnosis not present

## 2016-05-29 DIAGNOSIS — R293 Abnormal posture: Secondary | ICD-10-CM | POA: Insufficient documentation

## 2016-05-29 NOTE — Therapy (Signed)
[place], [place], [address] Phone: [phone]   Fax:  [phone]  Physical Therapy Treatment  Patient Details  Name: Daniel Cox MRN: [id number] Date of Birth: 1946/03/26 Referring Provider: Angie Fava, MD  Encounter Date: 05/29/2016      PT End of Session - 05/29/16 0859    Visit Number 2   Number of Visits 12   Date for PT Re-Evaluation 07/07/16   Authorization Type [facility] Medicare   PT Start Time [phone]   PT Stop Time 0945   PT Time Calculation (min) 58 min      Past Medical History:  Diagnosis Date  . Chronic kidney disease   . Degenerative disc disease, cervical   . Degenerative lumbar disc   . Erectile dysfunction   . Hyperlipidemia     Past Surgical History:  Procedure Laterality Date  . Appendectony    . COLONOSCOPY WITH PROPOFOL N/A 08/16/2015   Procedure: COLONOSCOPY WITH PROPOFOL;  Surgeon: Garlan Fair, MD;  Location: [facility];  Service: Endoscopy;  Laterality: N/A;  . LEFT HEART CATHETERIZATION WITH CORONARY ANGIOGRAM N/A 02/06/2013   Procedure: LEFT HEART CATHETERIZATION WITH CORONARY ANGIOGRAM;  Surgeon: Sinclair Grooms, MD;  Location[facility] C[facility] LAB;  Service: Cardiovascular;  Laterality: N/A;  . PERCUTANEOUS CORONARY STENT INTERVENTION (PCI-S) N/A 02/10/2013   Procedure: PERCUTANEOUS CORONARY STENT INTERVENTION (PCI-S);  Surgeon: Sinclair Grooms, MD;  Location: [facility] CATH LAB;  Service: Cardiovascular;  Laterality: N/A;  . Right shoulder      There were no vitals filed for this visit.      Subjective Assessment - 05/29/16 0857    Subjective My back is a little tight. My right arm has been cramping.    Currently in Pain? Yes   Pain Score 0-No pain   Pain Location Neck   Pain Score 5   Pain Location Back   Pain Orientation Mid;Lower   Pain Descriptors / Indicators Tightness   Aggravating Factors  bending, prolonged walking    Pain Relieving Factors sitting              [facility] PT Assessment - 05/29/16 0001      ROM / Strength   AROM / PROM / Strength AROM;PROM;Strength     AROM   AROM Assessment Site Hip;Knee   Right/Left Hip Right;Left   Right Hip Flexion 105   Left Hip Flexion 90   Right/Left Knee Right;Left   Left Knee Flexion 136     PROM   PROM Assessment Site Hip   Right/Left Hip Right;Left  Right hip flexion 118   Left Hip Flexion 108     Flexibility   Soft Tissue Assessment /Muscle Length yes                     [facility] Adult PT Treatment/Exercise - 05/29/16 0001      Exercises   Exercises Lumbar     Lumbar Exercises: Stretches   Single Knee to Chest Stretch 3 reps;30 seconds   Lower Trunk Rotation 10 seconds;5 reps   Piriformis Stretch Limitations modified figure 4 3 x 30 sec      Lumbar Exercises: Aerobic   Stationary Bike Nustep L4 UE/ LE x 5 minutes     Modalities   Modalities Moist Heat     Moist Heat Therapy   Number Minutes Moist Heat 15 Minutes   Moist Heat Location Lumbar Spine     Manual  Therapy   Manual Therapy Joint mobilization   Joint Mobilization posterior hip mobs grade 3 bilateral, long axis distraction LLE followed by PROM Flexion, ER                 PT Education - 05/29/16 0938    Education provided Yes   Education Details HEP    Person(s) Educated Patient   Methods Explanation;Handout   Comprehension Verbalized understanding          PT Short Term Goals - 05/24/16 1012      PT SHORT TERM GOAL #1   Title He will report understanding of good posture   Time 3   Period Weeks   Status New     PT SHORT TERM GOAL #2   Title He will be independent with initial HEP   Time 3   Period Weeks   Status New     PT SHORT TERM GOAL #3   Title He will report 20% decreased in neck pain with increase to 30 degrees LT cervical rotation   Time 3   Period Weeks   Status New           PT Long Term Goals - 05/24/16 1013      PT LONG TERM GOAL #1   Title He  will be independent with all HEP issued   Time 6   Period Weeks   Status New     PT LONG TERM GOAL #2   Title He will report pain in neck decr 50% or more with normal home taske   Time 6   Period Weeks   Status New     PT LONG TERM GOAL #3   Title Lumbar flexion will improve 10 degrees or more to demo incr flexibility   Time 6   Period Weeks   Status New     PT LONG TERM GOAL #4   Title He will report 25% decr in back pain.    Time 6   Period Weeks   Status New               Plan - 05/29/16 0902    Clinical Impression Statement Pt reports lumbar tightness today, no neck pain. He demonstrates decreased hip flexion and anterior hip tightness. Manual and stretching to improve bilateral hip motion. Bilateral hip flexion improved post session to 110 AROM and 130 PROM. Pt given tunk and hip stretches for HEP.    PT Next Visit Plan review and progress HEP stretching- add neck stretches;, manual , modalities, posture, asses hamstring and  hip motion /LE strength      Patient will benefit from skilled therapeutic intervention in order to improve the following deficits and impairments:  Postural dysfunction, Decreased strength, Pain, Impaired UE functional use, Increased muscle spasms, Decreased range of motion, Decreased activity tolerance  Visit Diagnosis: Cervicalgia  Muscle weakness (generalized)  Stiffness of right shoulder, not elsewhere classified  Abnormal posture  Cramp and spasm  Chronic midline low back pain without sciatica     Problem List Patient Active Problem List   Diagnosis Date Noted  . Left ventricular hypertrophy 02/12/2015  . Coronary atherosclerosis of native coronary artery 06/04/2013  . Essential hypertension, benign 06/04/2013  . Cerebral embolism 02/07/2013    Class: Acute  . Acute coronary syndrome ([place]) 02/04/2013    Class: Acute  . BPH (benign prostatic hypertrophy) 02/04/2013    Class: Chronic  . Chronic kidney disease (CKD),  stage III (moderate) 02/04/2013  .  Hyperlipidemia 02/04/2013  . Erectile dysfunction 02/04/2013    Class: Chronic    Dorene Ar, PTA 05/29/2016, 9:53 AM  [facility] [address] [place], [place], [address] Phone: [phone]   Fax:  [phone]  Name: Daniel Cox MRN: [id number] Date of Birth: March 21, 1946

## 2016-05-31 ENCOUNTER — Ambulatory Visit: Payer: Medicare Other | Admitting: Physical Therapy

## 2016-05-31 DIAGNOSIS — R293 Abnormal posture: Secondary | ICD-10-CM

## 2016-05-31 DIAGNOSIS — R252 Cramp and spasm: Secondary | ICD-10-CM

## 2016-05-31 DIAGNOSIS — M542 Cervicalgia: Secondary | ICD-10-CM

## 2016-05-31 DIAGNOSIS — M545 Low back pain, unspecified: Secondary | ICD-10-CM

## 2016-05-31 DIAGNOSIS — M25611 Stiffness of right shoulder, not elsewhere classified: Secondary | ICD-10-CM

## 2016-05-31 DIAGNOSIS — M6281 Muscle weakness (generalized): Secondary | ICD-10-CM

## 2016-05-31 NOTE — Therapy (Signed)
[place], [place], [address] Phone: [phone]   Fax:  [phone]  Physical Therapy Treatment  Patient Details  Name: Daniel Cox MRN: [id number] Date of Birth: [birth year] Referring Provider: Angie Fava, MD  Encounter Date: 05/31/2016      PT End of Session - 05/31/16 0938    Visit Number 3   Number of Visits 12   Date for PT Re-Evaluation 07/07/16   PT Start Time 0853   PT Stop Time 0936   PT Time Calculation (min) 43 min   Activity Tolerance Patient tolerated treatment well   Behavior During Therapy [facility] for tasks assessed/performed      Past Medical History:  Diagnosis Date  . Chronic kidney disease   . Degenerative disc disease, cervical   . Degenerative lumbar disc   . Erectile dysfunction   . Hyperlipidemia     Past Surgical History:  Procedure Laterality Date  . Appendectony    . COLONOSCOPY WITH PROPOFOL N/A 08/16/2015   Procedure: COLONOSCOPY WITH PROPOFOL;  Surgeon: Garlan Fair, MD;  Location: [facility] ENDOSCOPY;  Service: Endoscopy;  Laterality: N/A;  . LEFT HEART CATHETERIZATION WITH CORONARY ANGIOGRAM N/A 02/06/2013   Procedure: LEFT HEART CATHETERIZATION WITH CORONARY ANGIOGRAM;  Surgeon: Sinclair Grooms, MD;  Location: [facility] CATH LAB;  Service: Cardiovascular;  Laterality: N/A;  . PERCUTANEOUS CORONARY STENT INTERVENTION (PCI-S) N/A 02/10/2013   Procedure: PERCUTANEOUS CORONARY STENT INTERVENTION (PCI-S);  Surgeon: Sinclair Grooms, MD;  Location: [facility] CATH LAB;  Service: Cardiovascular;  Laterality: N/A;  . Right shoulder      There were no vitals filed for this visit.      Subjective Assessment - 05/31/16 0856    Subjective "things are going about the same, still having cramping thats in the forearm and works its way up"    Currently in Pain? Yes   Pain Score 4    Pain Location Neck   Pain Orientation Posterior   Pain Descriptors / Indicators --  stiffnes   Pain Onset  More than a month ago   Pain Frequency Constant   Pain Score 5   Pain Location Back   Pain Orientation Lower   Pain Descriptors / Indicators Tightness   Pain Frequency Constant            [facility] PT Assessment - 05/31/16 0001      AROM   Right Hip Extension -12  unable to get to nuetral in prone   Left Hip Extension -10  unable to get to neutral in prone                     [facility] Adult PT Treatment/Exercise - 05/31/16 0001      Neck Exercises: Sidelying   Other Sidelying Exercise book opening 2 x 10 bil     Lumbar Exercises: Seated   Hip Flexion on Ball AROM;Other (comment);10 reps  pelvic tilts on dyna disc x 2 sets   Hip Flexion on Ball Limitations verbal/ visual and tactile cues for mechanics and form     Manual Therapy   Manual Therapy Myofascial release   Joint Mobilization posterior hip mobs grade 3 bilateral, long axis distraction LLE followed by PROM Flexion, ER , Grade 3 Lumbar P>A at L1-L5   Myofascial Release manual trigger point relase x 3 in bil lumbar paraspinals, and DTM using tennis ball     Neck Exercises: Stretches   Upper Trapezius Stretch 2  reps;30 seconds                PT Education - 05/31/16 0938    Education provided Yes   Education Details updated and reviewed HEP, anatmoy education regard posture and spinal curves   Person(s) Educated Patient   Methods Explanation;Verbal cues;Handout;Demonstration   Comprehension Verbalized understanding;Verbal cues required;Returned demonstration          PT Short Term Goals - 05/24/16 1012      PT SHORT TERM GOAL #1   Title He will report understanding of good posture   Time 3   Period Weeks   Status New     PT SHORT TERM GOAL #2   Title He will be independent with initial HEP   Time 3   Period Weeks   Status New     PT SHORT TERM GOAL #3   Title He will report 20% decreased in neck pain with increase to 30 degrees LT cervical rotation   Time 3   Period Weeks    Status New           PT Long Term Goals - 05/24/16 1013      PT LONG TERM GOAL #1   Title He will be independent with all HEP issued   Time 6   Period Weeks   Status New     PT LONG TERM GOAL #2   Title He will report pain in neck decr 50% or more with normal home taske   Time 6   Period Weeks   Status New     PT LONG TERM GOAL #3   Title Lumbar flexion will improve 10 degrees or more to demo incr flexibility   Time 6   Period Weeks   Status New     PT LONG TERM GOAL #4   Title He will report 25% decr in back pain.    Time 6   Period Weeks   Status New               Plan - 05/31/16 0939    Clinical Impression Statement Mr. Faustini rreports increased sorness today in the low back and shoulder. continues to demonstrate tightness in the hip flexors limiting hip extension bil -10 in prone . worked on pelvic mobility for low back and stretching of the hip flexors. post session pt reported pain dropped to 3/10 in the back and neck, and he declined modaliities.    PT Next Visit Plan hip flexor stretching, , manual , modalities, bridges, posterior hip mobs,    PT Home Exercise Plan upper trap stretching, pelvic tilts in sitting, and book opening,   Consulted and Agree with Plan of Care Patient      Patient will benefit from skilled therapeutic intervention in order to improve the following deficits and impairments:  Postural dysfunction, Decreased strength, Pain, Impaired UE functional use, Increased muscle spasms, Decreased range of motion, Decreased activity tolerance  Visit Diagnosis: Cervicalgia  Muscle weakness (generalized)  Stiffness of right shoulder, not elsewhere classified  Abnormal posture  Cramp and spasm  Midline low back pain without sciatica, unspecified chronicity     Problem List Patient Active Problem List   Diagnosis Date Noted  . Left ventricular hypertrophy 02/12/2015  . Coronary atherosclerosis of native coronary artery 06/04/2013   . Essential hypertension, benign 06/04/2013  . Cerebral embolism 02/07/2013    Class: Acute  . Acute coronary syndrome ([place]) 02/04/2013    Class: Acute  . BPH (  benign prostatic hypertrophy) 02/04/2013    Class: Chronic  . Chronic kidney disease (CKD), stage III (moderate) 02/04/2013  . Hyperlipidemia 02/04/2013  . Erectile dysfunction 02/04/2013    Class: Chronic   Starr Lake PT, DPT, LAT, ATC  05/31/16  9:48 AM      [facility] [address] [place], [place], [address] Phone: [phone]   Fax:  [phone]  Name: ALEXSIS MAURER MRN: [id number] Date of Birth: [birth year]

## 2016-06-05 ENCOUNTER — Ambulatory Visit: Payer: Medicare Other | Admitting: Physical Therapy

## 2016-06-05 DIAGNOSIS — M545 Low back pain, unspecified: Secondary | ICD-10-CM

## 2016-06-05 DIAGNOSIS — R293 Abnormal posture: Secondary | ICD-10-CM

## 2016-06-05 DIAGNOSIS — M542 Cervicalgia: Secondary | ICD-10-CM | POA: Diagnosis not present

## 2016-06-05 DIAGNOSIS — M25611 Stiffness of right shoulder, not elsewhere classified: Secondary | ICD-10-CM

## 2016-06-05 DIAGNOSIS — G8929 Other chronic pain: Secondary | ICD-10-CM

## 2016-06-05 DIAGNOSIS — M6281 Muscle weakness (generalized): Secondary | ICD-10-CM

## 2016-06-05 DIAGNOSIS — R252 Cramp and spasm: Secondary | ICD-10-CM

## 2016-06-05 NOTE — Therapy (Signed)
[place], [place], [address] Phone: [phone]   Fax:  [phone]  Physical Therapy Treatment  Patient Details  Name: Daniel Cox MRN: [id number] Date of Birth: 1946-08-20 Referring Provider: Angie Fava, MD  Encounter Date: 06/05/2016      PT End of Session - 06/05/16 0850    Visit Number 4   Number of Visits 12   Date for PT Re-Evaluation 07/07/16   Authorization Type [facility] Medicare   PT Start Time 0845   PT Stop Time 0945   PT Time Calculation (min) 60 min      Past Medical History:  Diagnosis Date  . Chronic kidney disease   . Degenerative disc disease, cervical   . Degenerative lumbar disc   . Erectile dysfunction   . Hyperlipidemia     Past Surgical History:  Procedure Laterality Date  . Appendectony    . COLONOSCOPY WITH PROPOFOL N/A 08/16/2015   Procedure: COLONOSCOPY WITH PROPOFOL;  Surgeon: Garlan Fair, MD;  Location: [facility];  Service: Endoscopy;  Laterality: N/A;  . LEFT HEART CATHETERIZATION WITH CORONARY ANGIOGRAM N/A 02/06/2013   Procedure: LEFT HEART CATHETERIZATION WITH CORONARY ANGIOGRAM;  Surgeon: Sinclair Grooms, MD;  Location: [facility] CATH LAB;  Service: Cardiovascular;  Laterality: N/A;  . PERCUTANEOUS CORONARY STENT INTERVENTION (PCI-S) N/A 02/10/2013   Procedure: PERCUTANEOUS CORONARY STENT INTERVENTION (PCI-S);  Surgeon: Sinclair Grooms, MD;  Location: [facility] CATH LAB;  Service: Cardiovascular;  Laterality: N/A;  . Right shoulder      There were no vitals filed for this visit.      Subjective Assessment - 06/05/16 0848    Currently in Pain? Yes   Pain Score 3    Pain Location Neck   Pain Orientation Posterior   Pain Descriptors / Indicators Aching   Aggravating Factors  lifting   Pain Relieving Factors not lifting   Pain Score 5   Pain Location Back   Pain Orientation Left;Lower   Pain Descriptors / Indicators Aching  stiffness   Aggravating Factors   bending, prolonged walking   Pain Relieving Factors lay down                         [facility] Adult PT Treatment/Exercise - 06/05/16 0001      Neck Exercises: Sidelying   Other Sidelying Exercise --     Lumbar Exercises: Stretches   Single Knee to Chest Stretch 3 reps;30 seconds     Lumbar Exercises: Aerobic   Stationary Bike Nustep L4 UE/ LE x 5 minutes     Lumbar Exercises: Seated   Hip Flexion on Ball AROM;Other (comment);10 reps  pelvic tilts on dyna disc x 2 sets   Hip Flexion on Ball Limitations verbal/ visual and tactile cues for mechanics and form, also seated neutral with alternating marches with arms extended at 90 degrees. 2 x 10      Knee/Hip Exercises: Stretches   Hip Flexor Stretch 1 rep;60 seconds   Hip Flexor Stretch Limitations edge of mat      Manual Therapy   Joint Mobilization posterior hip mobs grade 3 bilateral, long axis distraction LLE followed by PROM Flexion, ER , prone anterior hip mobs with double towel under thight grade 3 followed hip flexor stretching      Neck Exercises: Stretches   Upper Trapezius Stretch 3 reps;30 seconds   Levator Stretch 3 reps;30 seconds  cues for posture  PT Education - 06/05/16 0958    Education provided Yes   Education Details HEP    Person(s) Educated Patient   Methods Explanation;Handout   Comprehension Verbalized understanding          PT Short Term Goals - 06/05/16 0851      PT SHORT TERM GOAL #1   Title He will report understanding of good posture   Time 3   Period Weeks   Status Achieved     PT SHORT TERM GOAL #2   Title He will be independent with initial HEP   Time 3   Period Weeks   Status Achieved     PT SHORT TERM GOAL #3   Title He will report 20% decreased in neck pain with increase to 30 degrees LT cervical rotation   Baseline about the same   Time 3   Period Weeks   Status On-going           PT Long Term Goals - 05/24/16 1013      PT LONG  TERM GOAL #1   Title He will be independent with all HEP issued   Time 6   Period Weeks   Status New     PT LONG TERM GOAL #2   Title He will report pain in neck decr 50% or more with normal home taske   Time 6   Period Weeks   Status New     PT LONG TERM GOAL #3   Title Lumbar flexion will improve 10 degrees or more to demo incr flexibility   Time 6   Period Weeks   Status New     PT LONG TERM GOAL #4   Title He will report 25% decr in back pain.    Time 6   Period Weeks   Status New               Plan - 06/05/16 6063    Clinical Impression Statement Pt reports some improvement in LBP maybe due to injection before beginning PT. He will get another injection tomorrow. He believes his neck pain is about the same. He understands good posture and is independent with his initial HEP. STG#1, #2 Met.    PT Next Visit Plan hip flexor stretching, , manual , modalities, bridges, posterior/ anterior hip mobs   PT Home Exercise Plan upper trap stretching, pelvic tilts in sitting, and book opening, levator stretch hip flexor stretch   Consulted and Agree with Plan of Care Patient      Patient will benefit from skilled therapeutic intervention in order to improve the following deficits and impairments:  Postural dysfunction, Decreased strength, Pain, Impaired UE functional use, Increased muscle spasms, Decreased range of motion, Decreased activity tolerance  Visit Diagnosis: Cervicalgia  Muscle weakness (generalized)  Stiffness of right shoulder, not elsewhere classified  Abnormal posture  Cramp and spasm  Midline low back pain without sciatica, unspecified chronicity  Chronic midline low back pain without sciatica     Problem List Patient Active Problem List   Diagnosis Date Noted  . Left ventricular hypertrophy 02/12/2015  . Coronary atherosclerosis of native coronary artery 06/04/2013  . Essential hypertension, benign 06/04/2013  . Cerebral embolism 02/07/2013     Class: Acute  . Acute coronary syndrome ([place]) 02/04/2013    Class: Acute  . BPH (benign prostatic hypertrophy) 02/04/2013    Class: Chronic  . Chronic kidney disease (CKD), stage III (moderate) 02/04/2013  . Hyperlipidemia 02/04/2013  . Erectile dysfunction  02/04/2013    Class: Chronic    Dorene Ar, PTA 06/05/2016, 10:42 AM  [facility] [address] [place], [place], [address] Phone: [phone]   Fax:  [phone]  Name: Daniel Cox MRN: [id number] Date of Birth: [birth year]

## 2016-06-05 NOTE — Patient Instructions (Signed)
Levator Stretch    Grasp seat or sit on hand on side to be stretched. Turn head toward other side and look down. Use hand on head to gently stretch neck in that position. Hold __30__ seconds. Repeat on other side. Repeat _3___ times. Do __2__ sessions per day.  Hip Flexor Stretch    Lying on back near edge of bed, bend one leg, foot flat. Hang other leg over edge, relaxed, thigh resting entirely on bed for ___1_ minutes. Repeat __1__ times. Do __2__ sessions per day. Advanced Exercise: Bend knee back keeping thigh in contact with bed.

## 2016-06-07 ENCOUNTER — Ambulatory Visit: Payer: Medicare Other

## 2016-06-12 ENCOUNTER — Ambulatory Visit: Payer: Medicare Other

## 2016-06-12 DIAGNOSIS — M542 Cervicalgia: Secondary | ICD-10-CM | POA: Diagnosis not present

## 2016-06-12 DIAGNOSIS — R252 Cramp and spasm: Secondary | ICD-10-CM

## 2016-06-12 DIAGNOSIS — M6281 Muscle weakness (generalized): Secondary | ICD-10-CM

## 2016-06-12 DIAGNOSIS — M545 Low back pain: Secondary | ICD-10-CM

## 2016-06-12 DIAGNOSIS — R293 Abnormal posture: Secondary | ICD-10-CM

## 2016-06-12 DIAGNOSIS — M25611 Stiffness of right shoulder, not elsewhere classified: Secondary | ICD-10-CM

## 2016-06-12 DIAGNOSIS — G8929 Other chronic pain: Secondary | ICD-10-CM

## 2016-06-12 NOTE — Therapy (Signed)
[place], [place], [address] Phone: [phone]   Fax:  [phone]  Physical Therapy Treatment  Patient Details  Name: Daniel Cox MRN: [id number] Date of Birth: 02/18/1946 Referring Provider: Angie Fava, MD  Encounter Date: 06/12/2016      PT End of Session - 06/12/16 9509    Activity Tolerance Patient tolerated treatment well;No increased pain   Behavior During Therapy WFL for tasks assessed/performed      Past Medical History:  Diagnosis Date  . Chronic kidney disease   . Degenerative disc disease, cervical   . Degenerative lumbar disc   . Erectile dysfunction   . Hyperlipidemia     Past Surgical History:  Procedure Laterality Date  . Appendectony    . COLONOSCOPY WITH PROPOFOL N/A 08/16/2015   Procedure: COLONOSCOPY WITH PROPOFOL;  Surgeon: Garlan Fair, MD;  Location: [facility] ENDOSCOPY;  Service: Endoscopy;  Laterality: N/A;  . LEFT HEART CATHETERIZATION WITH CORONARY ANGIOGRAM N/A 02/06/2013   Procedure: LEFT HEART CATHETERIZATION WITH CORONARY ANGIOGRAM;  Surgeon: Sinclair Grooms, MD;  Location: [facility] CATH LAB;  Service: Cardiovascular;  Laterality: N/A;  . PERCUTANEOUS CORONARY STENT INTERVENTION (PCI-S) N/A 02/10/2013   Procedure: PERCUTANEOUS CORONARY STENT INTERVENTION (PCI-S);  Surgeon: Sinclair Grooms, MD;  Location: [facility] CATH LAB;  Service: Cardiovascular;  Laterality: N/A;  . Right shoulder      There were no vitals filed for this visit.      Subjective Assessment - 06/12/16 0851    Subjective Back doing well since injection on Tuesday, 3/10.    Currently in Pain? Yes   Pain Score 3    Pain Location Neck   Pain Orientation Posterior   Pain Descriptors / Indicators Aching   Pain Type Chronic pain   Pain Onset More than a month ago   Pain Frequency Constant   Aggravating Factors  lifting   Pain Relieving Factors rest   Multiple Pain Sites Yes   Pain Score 3   Pain Location  Back   Pain Orientation Lower   Pain Descriptors / Indicators Aching;Tightness   Pain Type Chronic pain   Pain Onset More than a month ago   Pain Frequency Constant   Aggravating Factors  bending , walking   Pain Relieving Factors rest            [facility] PT Assessment - 06/12/16 0001      AROM   Cervical - Right Rotation 55   Cervical - Left Rotation 42   Lumbar Flexion 25   Lumbar - Right Side Bend 15   Lumbar - Left Side Bend 15                     [facility] Adult PT Treatment/Exercise - 06/12/16 0854      Lumbar Exercises: Aerobic   Stationary Bike Nustep L5 UE/ LE x 6 minutes     Moist Heat Therapy   Number Minutes Moist Heat 15 Minutes   Moist Heat Location Lumbar Spine;Cervical     Manual Therapy   Manual Therapy Passive ROM   Joint Mobilization PA Gr 3-4 lumbar spine central and LT lumbar spine and with sustained pressure to LT lower lumbar spine adding pressure with breathing , noincr pain.    Passive ROM Knee to chest with hip flexor stretch RT and LT x 45 sec, hamstring RT and LT x 45 sec.  ANd cervical manual traction and STW and ROM RT  and LT rotation                   PT Short Term Goals - 06/12/16 0926      PT SHORT TERM GOAL #1   Title He will report understanding of good posture   Status Achieved     PT SHORT TERM GOAL #2   Title He will be independent with initial HEP   Status Achieved     PT SHORT TERM GOAL #3   Title He will report 20% decreased in neck pain with increase to 30 degrees LT cervical rotation   Status On-going           PT Long Term Goals - 06/12/16 0926      PT LONG TERM GOAL #1   Title He will be independent with all HEP issued   Status On-going     PT LONG TERM GOAL #2   Title He will report pain in neck decr 50% or more with normal home taske   Status On-going     PT LONG TERM GOAL #3   Title Lumbar flexion will improve 10 degrees or more to demo incr flexibility   Status Achieved     PT  LONG TERM GOAL #4   Title He will report 25% decr in back pain.    Baseline with injection so will see how pain does for next few visits   Status Partially Met               Plan - 06/12/16 0853    Clinical Impression Statement Back pain improved with injection. Otherwise about the same.    PT Treatment/Interventions Cryotherapy;Electrical Stimulation;Iontophoresis '4mg'$ /ml Dexamethasone;Moist Heat;Traction;Ultrasound;Patient/family education;Passive range of motion;Manual techniques;Taping;Therapeutic exercise;Dry needling   PT Next Visit Plan Add to HEP stab lumbar and cervical , manual and heat.    PT Home Exercise Plan upper trap stretching, pelvic tilts in sitting, and book opening, levator stretch hip flexor stretch   Consulted and Agree with Plan of Care Patient      Patient will benefit from skilled therapeutic intervention in order to improve the following deficits and impairments:  Postural dysfunction, Decreased strength, Pain, Impaired UE functional use, Increased muscle spasms, Decreased range of motion, Decreased activity tolerance  Visit Diagnosis: Cervicalgia  Muscle weakness (generalized)  Stiffness of right shoulder, not elsewhere classified  Abnormal posture  Cramp and spasm  Chronic midline low back pain without sciatica     Problem List Patient Active Problem List   Diagnosis Date Noted  . Left ventricular hypertrophy 02/12/2015  . Coronary atherosclerosis of native coronary artery 06/04/2013  . Essential hypertension, benign 06/04/2013  . Cerebral embolism 02/07/2013    Class: Acute  . Acute coronary syndrome ([place]) 02/04/2013    Class: Acute  . BPH (benign prostatic hypertrophy) 02/04/2013    Class: Chronic  . Chronic kidney disease (CKD), stage III (moderate) 02/04/2013  . Hyperlipidemia 02/04/2013  . Erectile dysfunction 02/04/2013    Class: Chronic    Daniel Cox  PT 06/12/2016, 9:28 AM  [facility] [address] [place], [place], [address] Phone: [phone]   Fax:  [phone]  Name: Daniel Cox MRN: [id number] Date of Birth: 12-11-1945

## 2016-06-14 ENCOUNTER — Ambulatory Visit: Payer: Medicare Other | Admitting: Physical Therapy

## 2016-06-14 DIAGNOSIS — G8929 Other chronic pain: Secondary | ICD-10-CM

## 2016-06-14 DIAGNOSIS — M6281 Muscle weakness (generalized): Secondary | ICD-10-CM

## 2016-06-14 DIAGNOSIS — M542 Cervicalgia: Secondary | ICD-10-CM | POA: Diagnosis not present

## 2016-06-14 DIAGNOSIS — R252 Cramp and spasm: Secondary | ICD-10-CM

## 2016-06-14 DIAGNOSIS — M545 Low back pain: Secondary | ICD-10-CM

## 2016-06-14 DIAGNOSIS — R293 Abnormal posture: Secondary | ICD-10-CM

## 2016-06-14 DIAGNOSIS — M25611 Stiffness of right shoulder, not elsewhere classified: Secondary | ICD-10-CM

## 2016-06-14 NOTE — Therapy (Signed)
[place], [place], [address] Phone: [phone]   Fax:  [phone]  Physical Therapy Treatment  Patient Details  Name: RUSH SALCE MRN: [id number] Date of Birth: Mar 03, 1946 Referring Provider: Angie Fava, MD  Encounter Date: 06/14/2016      PT End of Session - 06/14/16 0857    Visit Number 6   Number of Visits 12   Date for PT Re-Evaluation 07/07/16   Authorization Type [facility]   PT Start Time 0845      Past Medical History:  Diagnosis Date  . Chronic kidney disease   . Degenerative disc disease, cervical   . Degenerative lumbar disc   . Erectile dysfunction   . Hyperlipidemia     Past Surgical History:  Procedure Laterality Date  . Appendectony    . COLONOSCOPY WITH PROPOFOL N/A 08/16/2015   Procedure: COLONOSCOPY WITH PROPOFOL;  Surgeon: Garlan Fair, MD;  Location: [facility];  Service: Endoscopy;  Laterality: N/A;  . LEFT HEART CATHETERIZATION WITH CORONARY ANGIOGRAM N/A 02/06/2013   Procedure: LEFT HEART CATHETERIZATION WITH CORONARY ANGIOGRAM;  Surgeon: Sinclair Grooms, MD;  Location: [facility] CATH LAB;  Service: Cardiovascular;  Laterality: N/A;  . PERCUTANEOUS CORONARY STENT INTERVENTION (PCI-S) N/A 02/10/2013   Procedure: PERCUTANEOUS CORONARY STENT INTERVENTION (PCI-S);  Surgeon: Sinclair Grooms, MD;  Location: [facility] CATH LAB;  Service: Cardiovascular;  Laterality: N/A;  . Right shoulder      There were no vitals filed for this visit.      Subjective Assessment - 06/14/16 0852    Subjective I was hanging pictures and a knot comes up in my forearm and upper arm. It goes away after about an hour,    Currently in Pain? Yes   Pain Score 2    Pain Location Neck   Pain Orientation Posterior;Left   Pain Descriptors / Indicators Aching   Aggravating Factors  tunring head twice or forcing it.    Pain Relieving Factors neutral   Pain Score 0   Pain Location Back   Pain  Orientation Lower                         [facility] Adult PT Treatment/Exercise - 06/14/16 0001      Neck Exercises: Seated   Other Seated Exercise Seated horizontal abduction with red band, c/o arm pain so Disc.    Other Seated Exercise Seated ER with red band, c/ arm pain so disc.      Neck Exercises: Supine   Other Supine Exercise Red band horizontal abduction- no arm pain in this position, ER red band increased arm pain so disc.    Other Supine Exercise Chin Tuck x 10 then added arm circles, horizontal abduction, min arm pain so disc.      Lumbar Exercises: Aerobic   Stationary Bike Nustep L5 UE/ LE x 5 minutes     Lumbar Exercises: Seated   Hip Flexion on Ball Limitations seated pelvic tilts on mat table x 10      Lumbar Exercises: Supine   AB Set Limitations posterior pelvic tilt    Clam Limitations 3 x 10 maintaining posterior pelvic tilt.    Bent Knee Raise 20 reps   Bent Knee Raise Limitations with posterior pelvic tilt      Moist Heat Therapy   Number Minutes Moist Heat 15 Minutes   Moist Heat Location Cervical     Manual Therapy  Manual Therapy Passive ROM;Soft tissue mobilization   Soft tissue mobilization Left upper traps, levator, sub occipitals   Passive ROM Cervical side bend and rotation, passive levator stretch                  PT Short Term Goals - 06/12/16 0926      PT SHORT TERM GOAL #1   Title He will report understanding of good posture   Status Achieved     PT SHORT TERM GOAL #2   Title He will be independent with initial HEP   Status Achieved     PT SHORT TERM GOAL #3   Title He will report 20% decreased in neck pain with increase to 30 degrees LT cervical rotation   Status On-going           PT Long Term Goals - 06/12/16 0926      PT LONG TERM GOAL #1   Title He will be independent with all HEP issued   Status On-going     PT LONG TERM GOAL #2   Title He will report pain in neck decr 50% or more with  normal home taske   Status On-going     PT LONG TERM GOAL #3   Title Lumbar flexion will improve 10 degrees or more to demo incr flexibility   Status Achieved     PT LONG TERM GOAL #4   Title He will report 25% decr in back pain.    Baseline with injection so will see how pain does for next few visits   Status Partially Met               Plan - 06/14/16 0944    Clinical Impression Statement Increased arm pain with all UE motions do discontinued. Added lumbar stabilization without increased lumbar pain. He requires mod cued for correct pelvic tilt and core engagement. Will review and add to HEP next visit.    PT Next Visit Plan Add to HEP stab lumbar and cervical , manual and heat.    PT Home Exercise Plan upper trap stretching, pelvic tilts in sitting, and book opening, levator stretch hip flexor stretch      Patient will benefit from skilled therapeutic intervention in order to improve the following deficits and impairments:  Postural dysfunction, Decreased strength, Pain, Impaired UE functional use, Increased muscle spasms, Decreased range of motion, Decreased activity tolerance  Visit Diagnosis: Cervicalgia  Muscle weakness (generalized)  Stiffness of right shoulder, not elsewhere classified  Abnormal posture  Cramp and spasm  Chronic midline low back pain without sciatica     Problem List Patient Active Problem List   Diagnosis Date Noted  . Left ventricular hypertrophy 02/12/2015  . Coronary atherosclerosis of native coronary artery 06/04/2013  . Essential hypertension, benign 06/04/2013  . Cerebral embolism 02/07/2013    Class: Acute  . Acute coronary syndrome ([place]) 02/04/2013    Class: Acute  . BPH (benign prostatic hypertrophy) 02/04/2013    Class: Chronic  . Chronic kidney disease (CKD), stage III (moderate) 02/04/2013  . Hyperlipidemia 02/04/2013  . Erectile dysfunction 02/04/2013    Class: Chronic    Dorene Ar, PTA 06/14/2016,  10:47 AM  [facility] [address] [place], [place], [address] Phone: [phone]   Fax:  [phone]  Name: LANDIS DOWDY MRN: [id number] Date of Birth: 1946/01/19

## 2016-06-15 IMAGING — CT CT RENAL STONE PROTOCOL
2 of 3 series · 16 of 30 positions shown, 18 images · non-contrast
Comparison: 09/27/2004; report not available.

CLINICAL DATA: Left-sided flank pain.  Hematuria.

EXAM:
CT ABDOMEN AND PELVIS WITHOUT CONTRAST
TECHNIQUE: Multidetector CT imaging of the abdomen and pelvis was performed
following the standard protocol without IV contrast.

[Series 3: coronal · coronal · 0.74mm/px · 3 of 116 slices shown]
[im 39/116  soft-tissue]
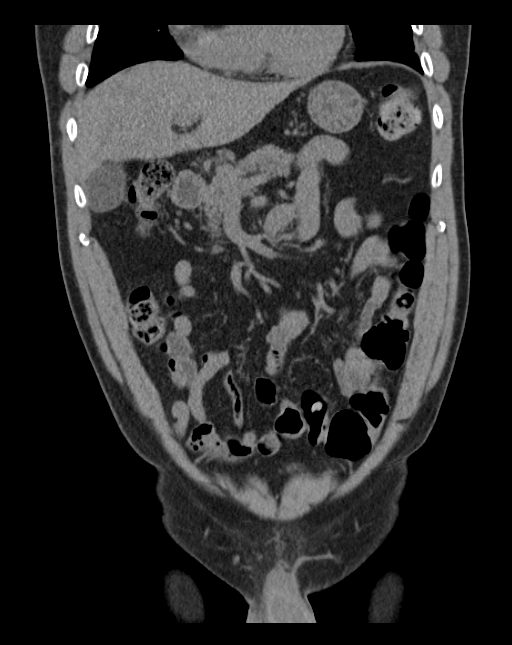
[im 52/116  soft-tissue]
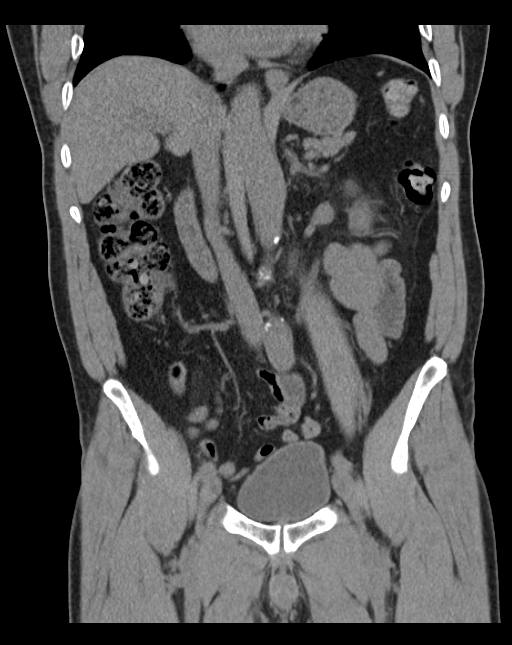
[im 64/116  soft-tissue]
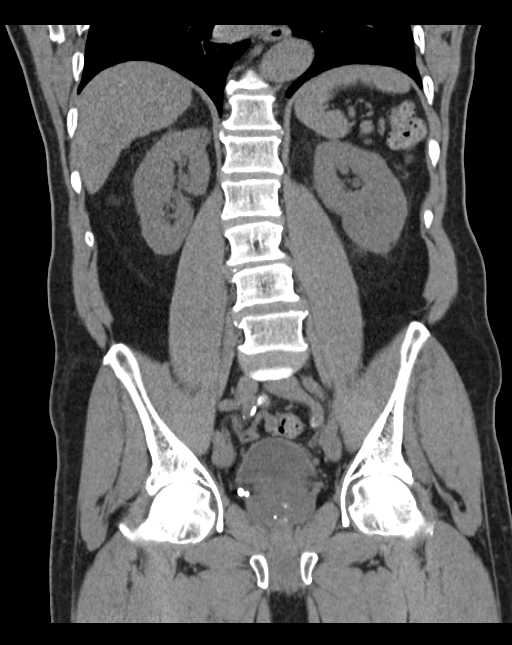

[Series 6: lung · axial · 0.74mm/px · z∈[+119,+184]mm · 13 of 16 slices shown, 15 images]
[im 2/16  soft-tissue]
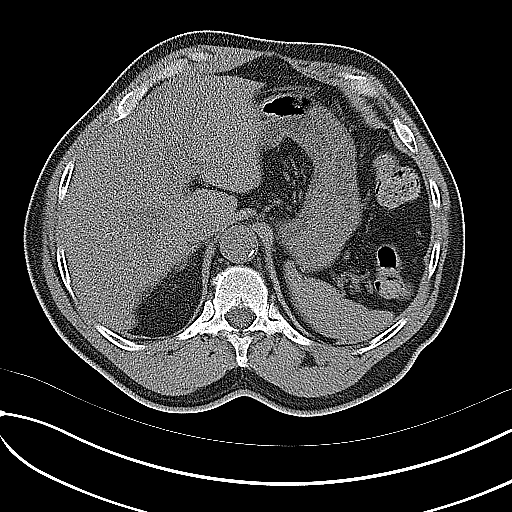
[im 2/16  bone]
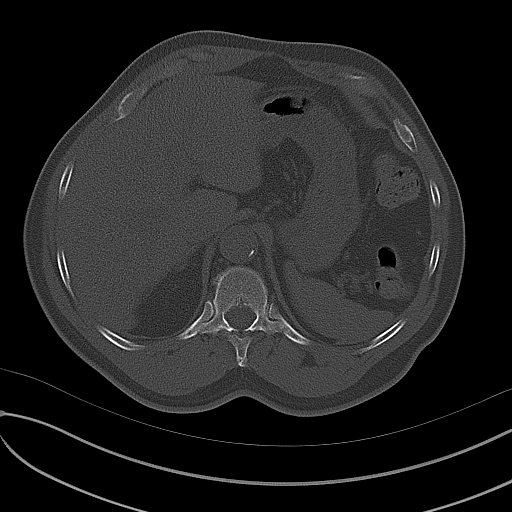
[im 3/16  soft-tissue]
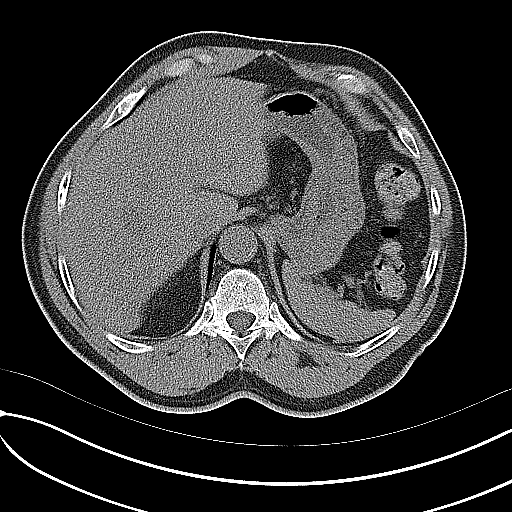
[im 4/16  soft-tissue]
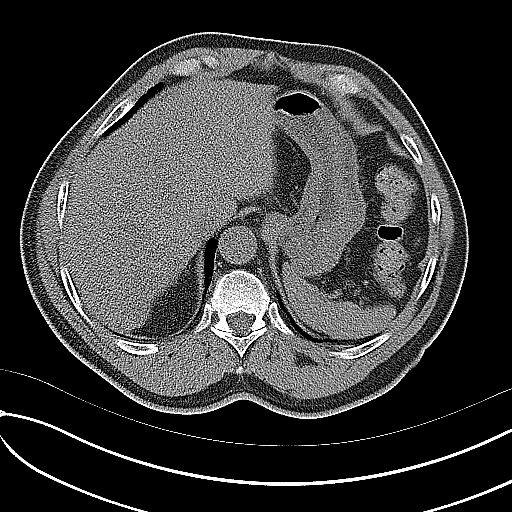
[im 5/16  soft-tissue]
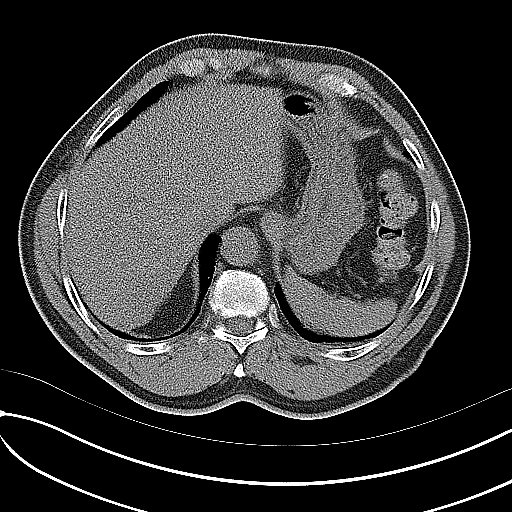
[im 6/16  soft-tissue]
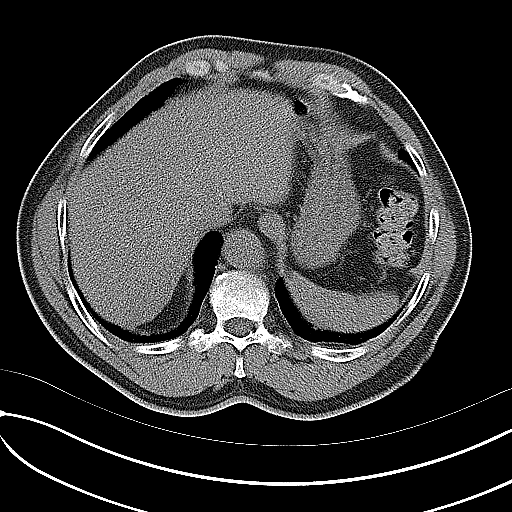
[im 7/16  soft-tissue]
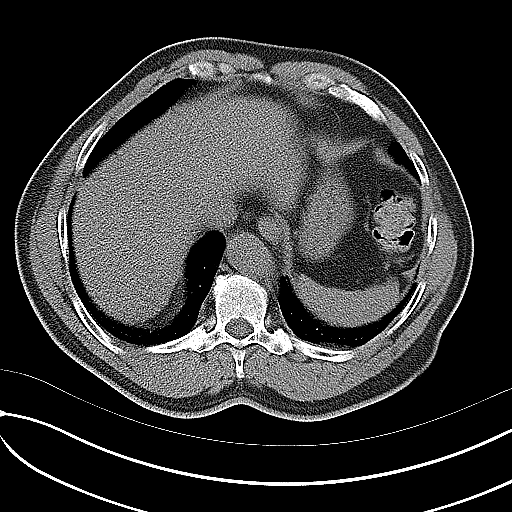
[im 9/16  soft-tissue]
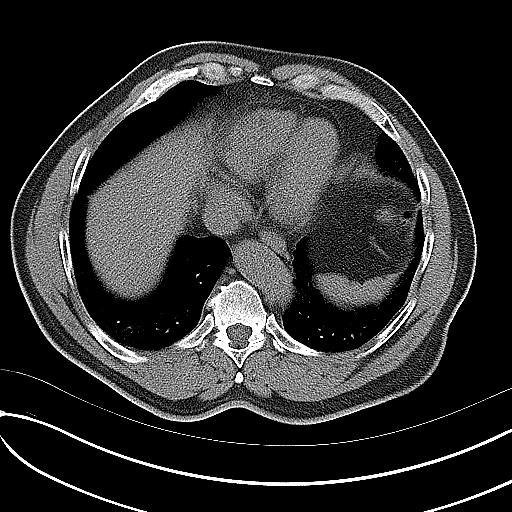
[im 10/16  soft-tissue]
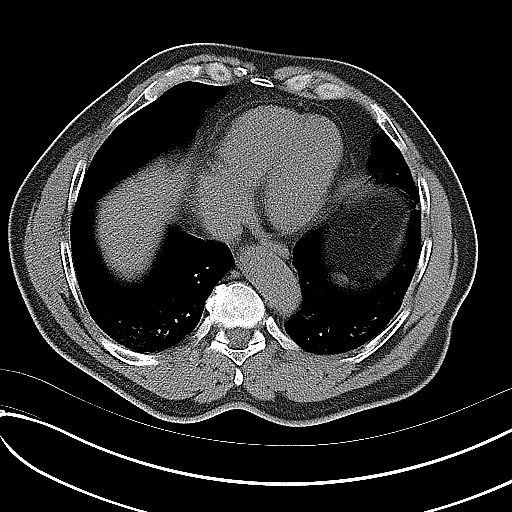
[im 11/16  soft-tissue]
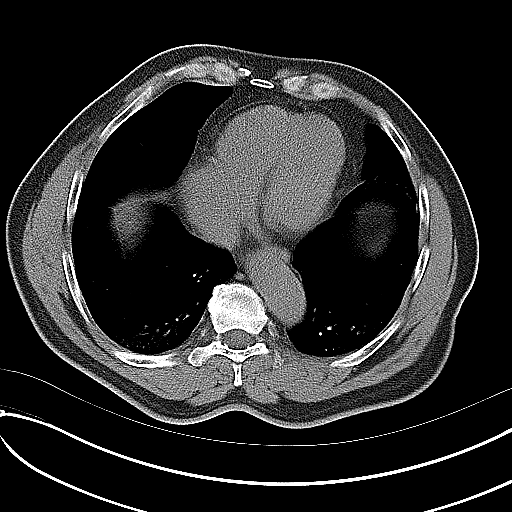
[im 11/16  bone]
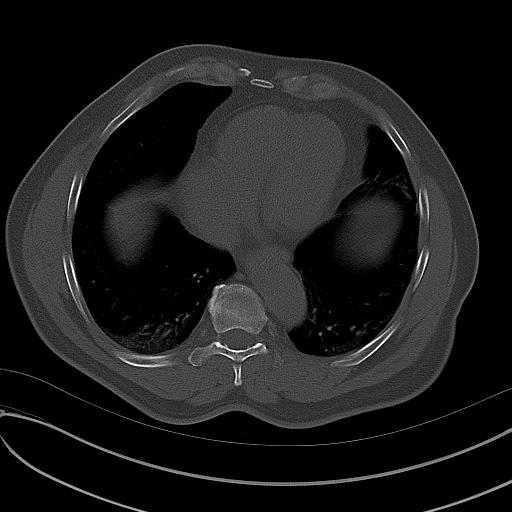
[im 12/16  soft-tissue]
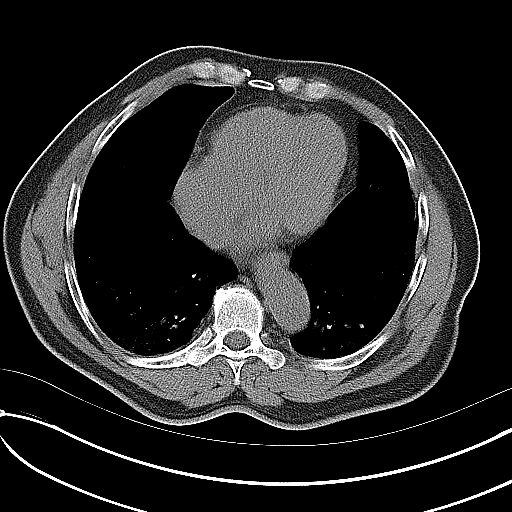
[im 13/16  soft-tissue]
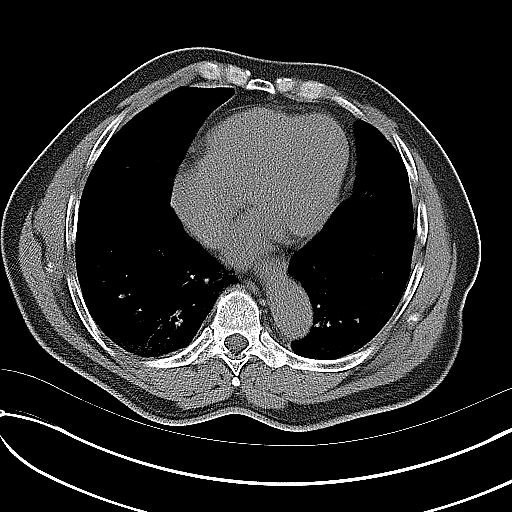
[im 14/16  soft-tissue]
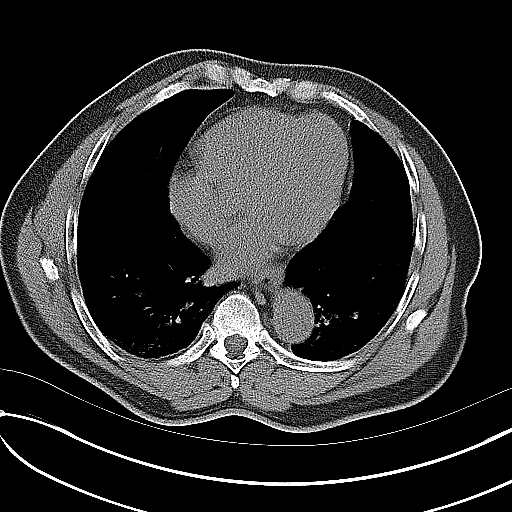
[im 15/16  soft-tissue]
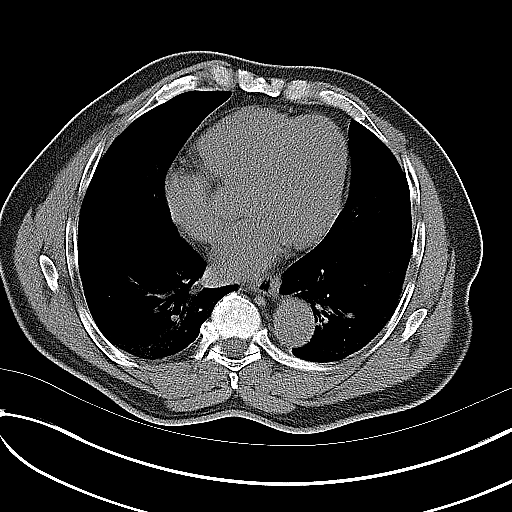

[16 of 30 positions shown; findings below may reference images not displayed]

FINDINGS: Lower chest: Bibasilar atelectasis. Mild cardiomegaly, without
pericardial or pleural effusion. Right coronary artery
atherosclerosis.

Hepatobiliary: Normal liver. Normal gallbladder, without biliary
ductal dilatation. Vascular calcifications are identified posterior
to the common duct.

Pancreas: Normal, without mass or ductal dilatation.

Spleen: Normal

Adrenals/Urinary Tract: Normal adrenal glands. An upper pole right
renal fluid density lesion is likely a cyst. Punctate interpolar
left renal calculus. Mild left-sided hydroureteronephrosis. This
continues to the level of a 2 mm stone at the left ureterovesicular
junction. Vascular calcifications, without right ureteric stone.

Stomach/Bowel: Proximal gastric underdistention. Probable
appendectomy. Normal colon and terminal ileum. Normal small bowel.

Vascular/Lymphatic: Aortic and branch vessel atherosclerosis. No
abdominopelvic adenopathy.

Reproductive: Normal prostate.

Other: No significant free fluid.

Musculoskeletal: Degenerative partial fusion of the right worse than
left sacroiliac joints. Transitional L5 vertebral body
IMPRESSION: 1. Mild left-sided hydroureteronephrosis secondary to a 2 mm stone
at the left ureterovesicular junction.
2. Left nephrolithiasis.
3.  Atherosclerosis, including within the coronary arteries.

## 2016-06-19 ENCOUNTER — Ambulatory Visit: Payer: Medicare Other | Admitting: Physical Therapy

## 2016-06-19 DIAGNOSIS — M25611 Stiffness of right shoulder, not elsewhere classified: Secondary | ICD-10-CM

## 2016-06-19 DIAGNOSIS — M6281 Muscle weakness (generalized): Secondary | ICD-10-CM

## 2016-06-19 DIAGNOSIS — R293 Abnormal posture: Secondary | ICD-10-CM

## 2016-06-19 DIAGNOSIS — G8929 Other chronic pain: Secondary | ICD-10-CM

## 2016-06-19 DIAGNOSIS — R252 Cramp and spasm: Secondary | ICD-10-CM

## 2016-06-19 DIAGNOSIS — M545 Low back pain, unspecified: Secondary | ICD-10-CM

## 2016-06-19 DIAGNOSIS — M542 Cervicalgia: Secondary | ICD-10-CM

## 2016-06-19 NOTE — Patient Instructions (Signed)
   Over Head Pull: Narrow Grip       On back, knees bent, feet flat, band across thighs, elbows straight but relaxed. Pull hands apart (start). Keeping elbows straight, bring arms up and over head, hands toward floor. Keep pull steady on band. Hold momentarily. Return slowly, keeping pull steady, back to start. Repeat __10-20_ times. Band color ____y__   Side Pull: Double Arm   On back, knees bent, feet flat. Arms perpendicular to body, shoulder level, elbows straight but relaxed. Pull arms out to sides, elbows straight. Resistance band comes across collarbones, hands toward floor. Hold momentarily. Slowly return to starting position. Repeat 10-20___ times. Band color _____   Sash   On back, knees bent, feet flat, left hand on left hip, right hand above left. Pull right arm DIAGONALLY (hip to shoulder) across chest. Bring right arm along head toward floor. Hold momentarily. Slowly return to starting position. Repeat _10-20__ times. Do with left arm. Band color _____y_   Shoulder Rotation: Double Arm   On back, knees bent, feet flat, elbows tucked at sides, bent 90, hands palms up. Pull hands apart and down toward floor, keeping elbows near sides. Hold momentarily. Slowly return to starting position. Repeat 10-20___ times. Band color ____y__  All 2 times per day

## 2016-06-19 NOTE — Therapy (Signed)
[facility] [address] [place], [place], [address] Phone: [phone]   Fax:  [phone]  Physical Therapy Treatment  Patient Details  Name: Daniel Cox MRN: [id number] Date of Birth: 08/31/1945 Referring Provider: [place], MD  Encounter Date: 06/19/2016      PT End of Session - 06/19/16 0845    Visit Number 7   Number of Visits 12   Date for PT Re-Evaluation 07/07/16   Authorization Type [facility]   PT Start Time 0843   PT Stop Time 0930   PT Time Calculation (min) 47 min      Past Medical History:  Diagnosis Date  . Chronic kidney disease   . Degenerative disc disease, cervical   . Degenerative lumbar disc   . Erectile dysfunction   . Hyperlipidemia     Past Surgical History:  Procedure Laterality Date  . Appendectony    . COLONOSCOPY WITH PROPOFOL N/A 08/16/2015   Procedure: COLONOSCOPY WITH PROPOFOL;  Surgeon: Martin K Johnson, MD;  Location: [facility];  Service: Endoscopy;  Laterality: N/A;  . LEFT HEART CATHETERIZATION WITH CORONARY ANGIOGRAM N/A 02/06/2013   Procedure: LEFT HEART CATHETERIZATION WITH CORONARY ANGIOGRAM;  Surgeon: Henry W Smith III, MD;  Location: [facility] CATH LAB;  Service: Cardiovascular;  Laterality: N/A;  . PERCUTANEOUS CORONARY STENT INTERVENTION (PCI-S) N/A 02/10/2013   Procedure: PERCUTANEOUS CORONARY STENT INTERVENTION (PCI-S);  Surgeon: Henry W Smith III, MD;  Location: [facility] CATH LAB;  Service: Cardiovascular;  Laterality: N/A;  . Right shoulder      There were no vitals filed for this visit.          [facility] PT Assessment - 06/19/16 0001      AROM   Cervical - Right Rotation 65   Cervical - Left Rotation 42   Lumbar Flexion 35   Lumbar - Right Side Bend 20   Lumbar - Left Side Bend 20                     [facility] Adult PT Treatment/Exercise - 06/19/16 0001      Neck Exercises: Supine   Neck Retraction 10 reps   Other Supine Exercise yellow band scap stab  series with horizontal abduction,, diagonals, pullovers, ER x 10 each    Other Supine Exercise Chin Tuck x 10 then added arm circles,     Lumbar Exercises: Aerobic   Stationary Bike Nustep L5 UE/ LE x 5 minutes     Lumbar Exercises: Supine   AB Set Limitations posterior pelvic tilt    Bent Knee Raise 20 reps   Bent Knee Raise Limitations with posterior pelvic tilt                   PT Short Term Goals - 06/19/16 0847      PT SHORT TERM GOAL #1   Title He will report understanding of good posture   Time 3   Period Weeks   Status Achieved     PT SHORT TERM GOAL #2   Title He will be independent with initial HEP   Time 3   Period Weeks   Status Achieved     PT SHORT TERM GOAL #3   Title He will report 20% decreased in neck pain with increase to 30 degrees LT cervical rotation   Time 3   Period Weeks   Status Achieved           PT Long Term Goals -   06/19/16 0848      PT LONG TERM GOAL #1   Title He will be independent with all HEP issued   Time 6   Period Weeks   Status On-going     PT LONG TERM GOAL #2   Title He will report pain in neck decr 50% or more with normal home taske   Baseline 40% improved   Time 6   Period Weeks   Status Partially Met     PT LONG TERM GOAL #3   Title Lumbar flexion will improve 10 degrees or more to demo incr flexibility   Time 6   Period Weeks   Status Achieved     PT LONG TERM GOAL #4   Title He will report 25% decr in back pain.    Baseline 60 % with injection so will see how pain does for next few visits   Time 6   Period Weeks   Status Achieved               Plan - 06/19/16 0901    Clinical Impression Statement Pt reports no arm flare up after last session. Later in the day he had arm cramps due to ligfing. Pt reports 60% decrease in back pain and 40% decrease in neck pain. His AROM has increased in both neck and back. His cervical rotation less 42 to the left and 65 to the right. His main complaint  is left arm spasm after UE activites, especially lifting and hammering. His grip strength is 25lbs in left arm and 62 lbs in right arm. He has completed PT in the past for shoulder ROM and UE strength. He reports no change. Issued neck and back stabilization exercises for HEP. Will review next visit and likely discharge. Most goals met. LTG#2 partially Met.    PT Next Visit Plan FOTO/ ERO  verses discharge; Review HEP stab lumbar and cervical , manual and heat.    PT Home Exercise Plan upper trap stretching, pelvic tilts in sitting, and book opening, levator stretch hip flexor stretch, supine yellow band stabilization, supine chin tuck    Consulted and Agree with Plan of Care Patient      Patient will benefit from skilled therapeutic intervention in order to improve the following deficits and impairments:  Postural dysfunction, Decreased strength, Pain, Impaired UE functional use, Increased muscle spasms, Decreased range of motion, Decreased activity tolerance  Visit Diagnosis: Cervicalgia  Muscle weakness (generalized)  Stiffness of right shoulder, not elsewhere classified  Abnormal posture  Cramp and spasm  Chronic midline low back pain without sciatica     Problem List Patient Active Problem List   Diagnosis Date Noted  . Left ventricular hypertrophy 02/12/2015  . Coronary atherosclerosis of native coronary artery 06/04/2013  . Essential hypertension, benign 06/04/2013  . Cerebral embolism 02/07/2013    Class: Acute  . Acute coronary syndrome ([place]) 02/04/2013    Class: Acute  . BPH (benign prostatic hypertrophy) 02/04/2013    Class: Chronic  . Chronic kidney disease (CKD), stage III (moderate) 02/04/2013  . Hyperlipidemia 02/04/2013  . Erectile dysfunction 02/04/2013    Class: Chronic    Dorene Ar, PTA 06/19/2016, 9:59 AM  [facility] [address] [place], [place], [address] Phone: [phone]   Fax:   [phone]  Name: Daniel Cox MRN: [id number] Date of Birth: 1946-06-28

## 2016-06-21 ENCOUNTER — Ambulatory Visit: Payer: Medicare Other

## 2016-06-21 DIAGNOSIS — M25611 Stiffness of right shoulder, not elsewhere classified: Secondary | ICD-10-CM

## 2016-06-21 DIAGNOSIS — M542 Cervicalgia: Secondary | ICD-10-CM

## 2016-06-21 DIAGNOSIS — R293 Abnormal posture: Secondary | ICD-10-CM

## 2016-06-21 DIAGNOSIS — M545 Low back pain, unspecified: Secondary | ICD-10-CM

## 2016-06-21 DIAGNOSIS — G8929 Other chronic pain: Secondary | ICD-10-CM

## 2016-06-21 DIAGNOSIS — M6281 Muscle weakness (generalized): Secondary | ICD-10-CM

## 2016-06-21 DIAGNOSIS — R252 Cramp and spasm: Secondary | ICD-10-CM

## 2016-06-21 NOTE — Therapy (Signed)
[place], [place], [address] Phone: [phone]   Fax:  [phone]  Physical Therapy Treatment/ Discharge  Patient Details  Name: Daniel Cox MRN: [id number] Date of Birth: 05-02-1946 Referring Provider: Angie Fava, MD  Encounter Date: 06/21/2016      PT End of Session - 06/21/16 0842    Visit Number 8   Number of Visits 12   Date for PT Re-Evaluation 07/07/16   Authorization Type [facility]   PT Start Time 0845   PT Stop Time 0930   PT Time Calculation (min) 45 min   Activity Tolerance Patient tolerated treatment well;No increased pain   Behavior During Therapy WFL for tasks assessed/performed      Past Medical History:  Diagnosis Date  . Chronic kidney disease   . Degenerative disc disease, cervical   . Degenerative lumbar disc   . Erectile dysfunction   . Hyperlipidemia     Past Surgical History:  Procedure Laterality Date  . Appendectony    . COLONOSCOPY WITH PROPOFOL N/A 08/16/2015   Procedure: COLONOSCOPY WITH PROPOFOL;  Surgeon: Garlan Fair, MD;  Location: [facility];  Service: Endoscopy;  Laterality: N/A;  . LEFT HEART CATHETERIZATION WITH CORONARY ANGIOGRAM N/A 02/06/2013   Procedure: LEFT HEART CATHETERIZATION WITH CORONARY ANGIOGRAM;  Surgeon: Sinclair Grooms, MD;  [facility] CATH LAB;  Service: Cardiovascular;  Laterality: N/A;  . PERCUTANEOUS CORONARY STENT INTERVENTION (PCI-S) N/A 02/10/2013   Procedure: PERCUTANEOUS CORONARY STENT INTERVENTION (PCI-S);  Surgeon: Sinclair Grooms, MD;  Location: [facility] CATH LAB;  Service: Cardiovascular;  Laterality: N/A;  . Right shoulder      There were no vitals filed for this visit.      Subjective Assessment - 06/21/16 0849    Subjective Any use of RT arm  he  appears to get spasm in RT forearm. He feels he is good with PT for now   Pertinent History Wrist and elbow and RTC repair  and cervical fusion (2015)  Still has pain in  neck and ROM decreased post but  better. He has had injections in spine x1 with some benefit            [facility] PT Assessment - 06/21/16 0001      AROM   Cervical - Right Rotation 65   Cervical - Left Rotation 42   Lumbar Flexion 35   Lumbar Extension 8   Lumbar - Right Side Bend 20   Lumbar - Left Side Bend 20                             PT Education - 06/21/16 [phone]    Education provided Yes   Education Details with exercise review cued for more correct technique thugh he was doing quite well  but needed  to ease off stretching that caused RT arm symptoms to start   Person(s) Educated Patient   Methods Explanation;Demonstration;Tactile cues;Verbal cues   Comprehension Returned demonstration;Verbalized understanding          PT Short Term Goals - 06/21/16 0848      PT SHORT TERM GOAL #1   Title He will report understanding of good posture   Status Achieved     PT SHORT TERM GOAL #2   Title He will be independent with initial HEP   Status Achieved     PT SHORT TERM GOAL #3   Title He  will report 20% decreased in neck pain with increase to 30 degrees LT cervical rotation   Status Achieved           PT Long Term Goals - 06/21/16 0848      PT LONG TERM GOAL #1   Title He will be independent with all HEP issued   Status Achieved     PT LONG TERM GOAL #2   Title He will report pain in neck decr 50% or more with normal home taske   Baseline 40% improved   Status Partially Met     PT LONG TERM GOAL #3   Title Lumbar flexion will improve 10 degrees or more to demo incr flexibility   Status Achieved     PT LONG TERM GOAL #4   Title He will report 25% decr in back pain.    Baseline 60% and he continues to have benefit.    Status Achieved               Plan - 06/21/16 0843    Clinical Impression Statement Daniel Cox feels he is doing well and PT has maxed benefit for now. He is doing HEP correctly and feels this is beneficial.  Injections have mad a mjor improvement in back pain and function.  He is ready for discharge   PT Treatment/Interventions Cryotherapy;Electrical Stimulation;Iontophoresis 12m/ml Dexamethasone;Moist Heat;Traction;Ultrasound;Patient/family education;Passive range of motion;Manual techniques;Taping;Therapeutic exercise;Dry needling   PT Next Visit Plan Discharge with HEP   PT Home Exercise Plan upper trap stretching, pelvic tilts in sitting, and book opening, levator stretch hip flexor stretch, supine yellow band stabilization, supine chin tuck    Consulted and Agree with Plan of Care Patient      Patient will benefit from skilled therapeutic intervention in order to improve the following deficits and impairments:  Postural dysfunction, Decreased strength, Pain, Impaired UE functional use, Increased muscle spasms, Decreased range of motion, Decreased activity tolerance  Visit Diagnosis: Cervicalgia  Muscle weakness (generalized)  Stiffness of right shoulder, not elsewhere classified  Abnormal posture  Cramp and spasm  Chronic midline low back pain without sciatica     Problem List Patient Active Problem List   Diagnosis Date Noted  . Left ventricular hypertrophy 02/12/2015  . Coronary atherosclerosis of native coronary artery 06/04/2013  . Essential hypertension, benign 06/04/2013  . Cerebral embolism 02/07/2013    Class: Acute  . Acute coronary syndrome ([place] 02/04/2013    Class: Acute  . BPH (benign prostatic hypertrophy) 02/04/2013    Class: Chronic  . Chronic kidney disease (CKD), stage III (moderate) 02/04/2013  . Hyperlipidemia 02/04/2013  . Erectile dysfunction 02/04/2013    Class: Chronic    CDarrel Hoover PT  06/21/2016, 9:24 AM  [facility] [address]Phone: [phone]  Fax:  [phone] Name: Daniel EWANMRN: [id number] of Birth: [birth year] PHYSICAL THERAPY  DISCHARGE SUMMARY  Visits from Start of Care: 8 Current functional level related to goals / functional outcomes: See above   Remaining deficits: See above   Education / Equipment: HEP Plan: Patient agrees to discharge.  Patient goals were met. Patient is being discharged due to being pleased with the current functional level.  ?????

## 2017-04-15 NOTE — Progress Notes (Signed)
Cardiology Office Note    Date:  04/17/2017   ID:  Daniel Cox, DOB 12/09/1945, MRN [id number]  PCP:  Mayra Neer, MD  Cardiologist: Sinclair Grooms, MD   Chief Complaint  Patient presents with  . Coronary Artery Disease  . Shortness of Breath    History of Present Illness:  Daniel Cox is a 71 y.o. male who presents for CAD, LAD drug-eluting stent during non-ST elevation MI in 2014, hypertension, left ventricular hypertrophy, embolic CVA, and chronic back discomfort.  He is doing well. He had acute coronary syndrome in 2014, ultimately underwent PCI of the 99% LAD, procedure was complicated by embolic CVA, and circumflex disease is been treated medically since that time. He denies angina. He has occasional heartburn after certain foods. Shortness of breath on exertion is his only limitation. This is been gradually worsening over time.  Past Medical History:  Diagnosis Date  . Chronic kidney disease   . Degenerative disc disease, cervical   . Degenerative lumbar disc   . Erectile dysfunction   . Hyperlipidemia     Past Surgical History:  Procedure Laterality Date  . Appendectony    . COLONOSCOPY WITH PROPOFOL N/A 08/16/2015   Procedure: COLONOSCOPY WITH PROPOFOL;  Surgeon: Garlan Fair, MD;  Location: [facility];  Service: Endoscopy;  Laterality: N/A;  . LEFT HEART CATHETERIZATION WITH CORONARY ANGIOGRAM N/A 02/06/2013   Procedure: LEFT HEART CATHETERIZATION WITH CORONARY ANGIOGRAM;  Surgeon: Sinclair Grooms, MD;  Location: [facility] CATH LAB;  Service: Cardiovascular;  Laterality: N/A;  . PERCUTANEOUS CORONARY STENT INTERVENTION (PCI-S) N/A 02/10/2013   Procedure: PERCUTANEOUS CORONARY STENT INTERVENTION (PCI-S);  Surgeon: Sinclair Grooms, MD;  Location: [facility] CATH LAB;  Service: Cardiovascular;  Laterality: N/A;  . Right shoulder      Current Medications: Outpatient Medications Prior to Visit  Medication Sig Dispense Refill  . acetaminophen (TYLENOL) 500  MG tablet Take 1,000 mg by mouth every 6 (six) hours as needed for mild pain, fever or headache.     . cetirizine (ZYRTEC) 10 MG tablet Take 10 mg by mouth daily as needed for allergies.    . cholecalciferol (VITAMIN D) 1000 UNITS tablet Take 1,000 Units by mouth every morning.    . metoprolol (LOPRESSOR) 50 MG tablet Take 25 mg by mouth daily.    . Multiple Vitamins-Minerals (MULTIVITAMIN WITH MINERALS) tablet Take 1 tablet by mouth every morning.    . nitroGLYCERIN (NITROSTAT) 0.4 MG SL tablet Place 1 tablet (0.4 mg total) under the tongue every 5 (five) minutes x 3 doses as needed for chest pain. 25 tablet 12  . aspirin 325 MG EC tablet Take 325 mg by mouth every morning.    Marland Kitchen atorvastatin (LIPITOR) 40 MG tablet Take 40 mg by mouth daily.    Marland Kitchen oxyCODONE-acetaminophen (PERCOCET/ROXICET) 5-325 MG per tablet Take 2 tablets by mouth every 4 (four) hours as needed for severe pain. (Patient not taking: Reported on 05/24/2016) 6 tablet 0   No facility-administered medications prior to visit.      Allergies:   Penicillins   Social History   Social History  . Marital status: Married    Spouse name: N/A  . Number of children: 3  . Years of education: N/A   Social History Main Topics  . Smoking status: Former Smoker    Packs/day: 0.50    Years: 30.00  . Smokeless tobacco: Never Used     Comment: 2 weeks ago from 06-04-13  .  Alcohol use No  . Drug use: No  . Sexual activity: Yes   Other Topics Concern  . None   Social History Narrative  . None     Family History:  The patient's family history includes Colon cancer in his sister; Diabetes in his sister; Heart attack in his brother, brother, father, and mother; Heart disease in his sister.   ROS:   Please see the history of present illness.    Some difficulty with depression, back pain, muscle pain, difficulty with balance, anxiety, snoring, and cough.  All other systems reviewed and are negative.   PHYSICAL EXAM:   VS:  BP 130/89  (BP Location: Left Arm, Patient Position: Supine, Cuff Size: Normal)   Pulse 61   Ht 5\' 8"  (1.727 m)   Wt 170 lb (77.1 kg)   BMI 25.85 kg/m    GEN: Well nourished, well developed, in no acute distress  HEENT: normal  Neck: no JVD, carotid bruits, or masses Cardiac: RRR; no murmurs, rubs, or gallops,no edema  Respiratory:  clear to auscultation bilaterally, normal work of breathing GI: soft, nontender, nondistended, + BS MS: no deformity or atrophy  Skin: warm and dry, no rash Neuro:  Alert and Oriented x 3, Strength and sensation are intact Psych: euthymic mood, full affect  Wt Readings from Last 3 Encounters:  04/17/17 170 lb (77.1 kg)  08/16/15 177 lb (80.3 kg)  02/12/15 177 lb (80.3 kg)      Studies/Labs Reviewed:   EKG:  EKG  Sinus rhythm and nonspecific T-wave abnormality. No acute abnormality. When compared to prior tracings from 2016, no changes occurred.  Recent Labs: No results found for requested labs within last 8760 hours.   Lipid Panel    Component Value Date/Time   CHOL 128 02/07/2013 0458   TRIG 80 02/07/2013 0458   HDL 35 (L) 02/07/2013 0458   CHOLHDL 3.7 02/07/2013 0458   VLDL 16 02/07/2013 0458   LDLCALC 77 02/07/2013 0458    Additional studies/ records that were reviewed today include:  Echocardiogram 2014: Study Conclusions  - Left ventricle: The cavity size was normal. There was   moderate concentric hypertrophy. Systolic function was   normal. The estimated ejection fraction was in the range   of 55% to 60%. Hypokinesis of the anteroseptal myocardium. - Aortic root: The aortic root was mildly dilated. Impressions:  - No cardiac source of emboli was indentified.    ASSESSMENT:    1. Atherosclerosis of native coronary artery of native heart without angina pectoris   2. DOE (dyspnea on exertion)   3. Pure hypercholesterolemia   4. Essential hypertension, benign   5. Cerebral embolism      PLAN:  In order of problems listed  above:  1. Dyspnea on exertion may be ischemia related. He has no residual significant obstruction in the first and second obtuse marginal branches. Borderline obstruction is noted in the of the vessel. LAD stent has remained clinically patent off dual antiplatelet therapy now for greater than 3 years. Plan stress Myoview to exclude high risk subset. Decrease aspirin 81 mg per day. 2. Stress Myoview which will give some idea of LV function as well. 3. Target LDL less than 70. When last evaluated in May, it was 108. Increase atorvastatin to 80 mg per day. Liver and lipid panel in 6-8 weeks. 4. Low-salt diet. Target blood pressure less than or equal to 130/90 mmHg. 5. No noticeable/functional residual. Embolic stroke occurred at the time of coronary  angiography.  Aggressive risk factor modification. LDL target less than 70. Decrease aspirin 81 mg per day. Stress Myoview. Clinical follow-up in one year or earlier if high risk imaging study.  Medication Adjustments/Labs and Tests Ordered: Current medicines are reviewed at length with the patient today.  Concerns regarding medicines are outlined above.  Medication changes, Labs and Tests ordered today are listed in the Patient Instructions below. There are no Patient Instructions on file for this visit.   Signed, Sinclair Grooms, MD  04/17/2017 9:16 AM    [facility] [address], [place], [place]  [address] Phone: [phone]; Fax: [phone]

## 2017-04-17 ENCOUNTER — Ambulatory Visit (INDEPENDENT_AMBULATORY_CARE_PROVIDER_SITE_OTHER): Payer: Medicare Other | Admitting: Interventional Cardiology

## 2017-04-17 ENCOUNTER — Encounter: Payer: Self-pay | Admitting: Interventional Cardiology

## 2017-04-17 VITALS — BP 130/89 | HR 61 | Ht 68.0 in | Wt 170.0 lb

## 2017-04-17 DIAGNOSIS — E7800 Pure hypercholesterolemia, unspecified: Secondary | ICD-10-CM

## 2017-04-17 DIAGNOSIS — I669 Occlusion and stenosis of unspecified cerebral artery: Secondary | ICD-10-CM | POA: Diagnosis not present

## 2017-04-17 DIAGNOSIS — I2510 Atherosclerotic heart disease of native coronary artery without angina pectoris: Secondary | ICD-10-CM | POA: Diagnosis not present

## 2017-04-17 DIAGNOSIS — I10 Essential (primary) hypertension: Secondary | ICD-10-CM | POA: Diagnosis not present

## 2017-04-17 DIAGNOSIS — R0609 Other forms of dyspnea: Secondary | ICD-10-CM

## 2017-04-17 MED ORDER — ATORVASTATIN CALCIUM 80 MG PO TABS: 80.0000 mg | ORAL_TABLET | Freq: Every day | ORAL | 3 refills | Status: DC

## 2017-04-17 MED ORDER — ASPIRIN EC 81 MG PO TBEC: 81.0000 mg | DELAYED_RELEASE_TABLET | Freq: Every day | ORAL | 3 refills | Status: AC

## 2017-04-17 NOTE — Patient Instructions (Signed)
Medication Instructions:  1) DECREASE Aspirin to 81mg  once daily 2) INCREASE Atorvastatin to 80mg  once daily  Labwork: Your physician recommends that you return for lab work in: 6-8 weeks (Lipid, liver)   Testing/Procedures: Your physician has requested that you have en exercise stress myoview. For further information please visit [email or web address]. Please follow instruction sheet, as given.   Follow-Up: Your physician wants you to follow-up in: 1 year with Dr. [name].  You will receive a reminder letter in the mail two months in advance. If you don't receive a letter, please call our office to schedule the follow-up appointment.   Any Other Special Instructions Will Be Listed Below (If Applicable).     If you need a refill on your cardiac medications before your next appointment, please call your pharmacy.

## 2017-04-23 ENCOUNTER — Telehealth (HOSPITAL_COMMUNITY): Payer: Self-pay | Admitting: *Deleted

## 2017-04-23 NOTE — Telephone Encounter (Signed)
Patient given detailed instructions per Myocardial Perfusion Study Information Sheet for the test on  04/25/17. Patient notified to arrive 15 minutes early and that it is imperative to arrive on time for appointment to keep from having the test rescheduled.  If you need to cancel or reschedule your appointment, please call the office within 24 hours of your appointment. . Patient verbalized understanding. Daniel Cox [name]    

## 2017-04-25 ENCOUNTER — Ambulatory Visit (HOSPITAL_COMMUNITY): Payer: Medicare Other | Attending: Interventional Cardiology

## 2017-04-25 DIAGNOSIS — I2510 Atherosclerotic heart disease of native coronary artery without angina pectoris: Secondary | ICD-10-CM | POA: Diagnosis not present

## 2017-04-25 DIAGNOSIS — R0609 Other forms of dyspnea: Secondary | ICD-10-CM | POA: Diagnosis present

## 2017-04-25 LAB — MYOCARDIAL PERFUSION IMAGING
CHL CUP NUCLEAR SSS: 8
CHL CUP RESTING HR STRESS: 48 {beats}/min
CSEPED: 9 min
CSEPHR: 89 %
Estimated workload: 10.1 METS
LV sys vol: 46 mL
LVDIAVOL: 94 mL (ref 62–150)
MPHR: 150 {beats}/min
Peak HR: 133 {beats}/min
RATE: 0.31
RPE: 16
SDS: 5
SRS: 7
TID: 0.9

## 2017-04-25 MED ORDER — TECHNETIUM TC 99M TETROFOSMIN IV KIT
32.8000 | PACK | Freq: Once | INTRAVENOUS | Status: AC | PRN
  Administered 2017-04-25: 32.8 via INTRAVENOUS
  Filled 2017-04-25: qty 33

## 2017-04-25 MED ORDER — TECHNETIUM TC 99M TETROFOSMIN IV KIT
10.2000 | PACK | Freq: Once | INTRAVENOUS | Status: AC | PRN
Start: 2017-04-25 — End: 2017-04-25
  Administered 2017-04-25: 10.2 via INTRAVENOUS
  Filled 2017-04-25: qty 11

## 2017-04-26 ENCOUNTER — Telehealth: Payer: Self-pay | Admitting: *Deleted

## 2017-04-26 NOTE — Telephone Encounter (Signed)
Lmtcb to go over Myoview results.  

## 2017-04-26 NOTE — Telephone Encounter (Signed)
-----   Message from Belva Crome, MD sent at 04/26/2017 12:51 PM EDT ----- Let the patient know the stress test does not show any severe abnormalities. He had good exercise tolerance. No evidence from this study of worsening blockages. Heart muscle strength is in the lower limit of normal. Overall very reassuring study. He needs follow-up with me in one year. No change in the current management strategy. A copy will be sent to Mayra Neer, MD

## 2017-05-01 ENCOUNTER — Telehealth: Payer: Self-pay | Admitting: Interventional Cardiology

## 2017-05-01 NOTE — Telephone Encounter (Signed)
Follow UP:    Returning call from 04-26-17,concerning his test results.

## 2017-05-01 NOTE — Telephone Encounter (Signed)
Informed pt of myoview results. Pt verbalized understanding and was appreciative for call.

## 2017-06-12 ENCOUNTER — Other Ambulatory Visit: Payer: Medicare Other | Admitting: *Deleted

## 2017-06-12 DIAGNOSIS — E7800 Pure hypercholesterolemia, unspecified: Secondary | ICD-10-CM

## 2017-06-12 LAB — HEPATIC FUNCTION PANEL
ALBUMIN: 4.2 g/dL (ref 3.5–4.8)
ALK PHOS: 116 IU/L (ref 39–117)
ALT: 13 IU/L (ref 0–44)
AST: 23 IU/L (ref 0–40)
Bilirubin Total: 0.7 mg/dL (ref 0.0–1.2)
Bilirubin, Direct: 0.15 mg/dL (ref 0.00–0.40)
TOTAL PROTEIN: 7 g/dL (ref 6.0–8.5)

## 2017-06-12 LAB — LIPID PANEL
CHOLESTEROL TOTAL: 137 mg/dL (ref 100–199)
Chol/HDL Ratio: 3.7 ratio (ref 0.0–5.0)
HDL: 37 mg/dL — ABNORMAL LOW (ref >39)
LDL Calculated: 85 mg/dL (ref 0–99)
Triglycerides: 73 mg/dL (ref 0–149)
VLDL Cholesterol Cal: 15 mg/dL (ref 5–40)

## 2017-06-12 NOTE — Addendum Note (Signed)
Addended by: Eulis Foster on: 06/12/2017 09:10 AM   Modules accepted: Orders

## 2017-06-13 ENCOUNTER — Telehealth: Payer: Self-pay

## 2017-06-13 DIAGNOSIS — E7800 Pure hypercholesterolemia, unspecified: Secondary | ICD-10-CM

## 2017-06-13 NOTE — Telephone Encounter (Signed)
spoke with patient about recent lab results. patient verbalized understanding. patient will repeat lab tests in 1 year on June 13 2018. copy sent to PCP.

## 2017-09-06 ENCOUNTER — Telehealth: Payer: Self-pay | Admitting: Interventional Cardiology

## 2017-09-06 MED ORDER — METOPROLOL TARTRATE 50 MG PO TABS: 25.0000 mg | ORAL_TABLET | Freq: Every day | ORAL | 2 refills | Status: DC

## 2017-09-06 NOTE — Telephone Encounter (Signed)
Pt's medication was sent to pt's pharmacy as requested. Confirmation received.  °

## 2017-09-06 NOTE — Telephone Encounter (Signed)
New message   *STAT* If patient is at the pharmacy, call can be transferred to refill team.   1. Which medications need to be refilled? (please list name of each medication and dose if known) metoprolol (LOPRESSOR)  25MG   2. Which pharmacy/location (including street and city if local pharmacy) is medication to be sent to?[facility]  3. Do they need a 30 day or 90 day supply? [place]

## 2017-09-06 NOTE — Telephone Encounter (Signed)
Pt calling requesting a refill on metoprolol 25 mg tablet taken 1/2 daily. Dr. [name] did not prescribe this medication. Would you like to refill this medication? Please address.

## 2017-09-06 NOTE — Telephone Encounter (Signed)
Ok to fill 

## 2018-05-01 DIAGNOSIS — Z23 Encounter for immunization: Secondary | ICD-10-CM | POA: Diagnosis not present

## 2018-05-01 DIAGNOSIS — M79644 Pain in right finger(s): Secondary | ICD-10-CM | POA: Diagnosis not present

## 2018-05-23 NOTE — Progress Notes (Signed)
Cardiology Office Note:    Date:  05/24/2018   ID:  Daniel Cox, DOB 1945-10-21, MRN [id number]  PCP:  Mayra Neer, MD  Cardiologist:  No primary care provider on file.   Referring MD: Mayra Neer, MD   Chief Complaint  Patient presents with  . Coronary Artery Disease    History of Present Illness:    Daniel Cox is a 72 y.o. male with a hx of CAD, LAD drug-eluting stent during non-ST elevation MI in 2014, hypertension, left ventricular hypertrophy, embolic CVA complicating coronary angiography, and chronic back discomfort.   Says he feels well.  Has been complaining to his primary physician that he has been unable to sleep.  [facility] and found severe [facility] apnea.  Not using CPAP.  He denies angina.  No nitroglycerin use.  [facility].  States it is because nitroglycerin is on his problem list but he has never used nitro.  He had coronary stent implantation 2014.  No angina or other complaints from ischemia standpoint since that time.    Past Medical History:  Diagnosis Date  . Chronic kidney disease   . Degenerative disc disease, cervical   . Degenerative lumbar disc   . Erectile dysfunction   . Hyperlipidemia     Past Surgical History:  Procedure Laterality Date  . Appendectony    . COLONOSCOPY WITH PROPOFOL N/A 08/16/2015   Procedure: COLONOSCOPY WITH PROPOFOL;  Surgeon: Garlan Fair, MD;  Location: [facility];  Service: Endoscopy;  Laterality: N/A;  . LEFT HEART CATHETERIZATION WITH CORONARY ANGIOGRAM N/A 02/06/2013   Procedure: LEFT HEART CATHETERIZATION WITH CORONARY ANGIOGRAM;  Surgeon: Sinclair Grooms, MD;  Location: [facility] CATH LAB;  Service: Cardiovascular;  Laterality: N/A;  . PERCUTANEOUS CORONARY STENT INTERVENTION (PCI-S) N/A 02/10/2013   Procedure: PERCUTANEOUS CORONARY STENT INTERVENTION (PCI-S);  Surgeon: Sinclair Grooms, MD;  Location: [facility] CATH LAB;  Service: Cardiovascular;  Laterality: N/A;    . Right shoulder      Current Medications: Current Meds  Medication Sig  . acetaminophen (TYLENOL) 500 MG tablet Take 1,000 mg by mouth every 6 (six) hours as needed for mild pain, fever or headache.   Marland Kitchen aspirin EC 81 MG tablet Take 1 tablet (81 mg total) by mouth daily.  Marland Kitchen atorvastatin (LIPITOR) 80 MG tablet Take 80 mg by mouth daily.  . cetirizine (ZYRTEC) 10 MG tablet Take 10 mg by mouth daily as needed for allergies.  . cholecalciferol (VITAMIN D) 1000 UNITS tablet Take 1,000 Units by mouth every morning.  Marland Kitchen lisinopril (PRINIVIL,ZESTRIL) 10 MG tablet Take 10 mg by mouth daily.  . metoprolol tartrate (LOPRESSOR) 50 MG tablet Take 0.5 tablets (25 mg total) by mouth daily.  . Multiple Vitamins-Minerals (MULTIVITAMIN WITH MINERALS) tablet Take 1 tablet by mouth every morning.  . nitroGLYCERIN (NITROSTAT) 0.4 MG SL tablet Place 1 tablet (0.4 mg total) under the tongue every 5 (five) minutes x 3 doses as needed for chest pain.  . NON FORMULARY CPAP Machine - Use as directed at bedtime.     Allergies:   Penicillins   Social History   Socioeconomic History  . Marital status: Married    Spouse name: Not on file  . Number of children: 3  . Years of education: Not on file  . Highest education level: Not on file  Occupational History  . Not on file  Social Needs  . Financial resource strain: Not on  file  . Food insecurity:    Worry: Not on file    Inability: Not on file  . Transportation needs:    Medical: Not on file    Non-medical: Not on file  Tobacco Use  . Smoking status: Former Smoker    Packs/day: 0.50    Years: 30.00    Pack years: 15.00  . Smokeless tobacco: Never Used  . Tobacco comment: 2 weeks ago from 06-04-13  Substance and Sexual Activity  . Alcohol use: No  . Drug use: No  . Sexual activity: Yes  Lifestyle  . Physical activity:    Days per week: Not on file    Minutes per session: Not on file  . Stress: Not on file  Relationships  . Social connections:     Talks on phone: Not on file    Gets together: Not on file    Attends religious service: Not on file    Active member of club or organization: Not on file    Attends meetings of clubs or organizations: Not on file    Relationship status: Not on file  Other Topics Concern  . Not on file  Social History Narrative  . Not on file     Family History: The patient's family history includes Colon cancer in his sister; Diabetes in his sister; Heart attack in his brother, brother, father, and mother; Heart disease in his sister.  ROS:   Please see the history of present illness.    Muscle aches and pains, snoring, difficulty sleeping, recent diagnosis of obstructive sleep apnea.  All other systems reviewed and are negative.  EKGs/Labs/Other Studies Reviewed:    The following studies were reviewed today: No new data  EKG:  EKG is  ordered today.  The ekg ordered today demonstrates normal sinus rhythm, left axis deviation, otherwise unremarkable.  Recent Labs: 06/12/2017: ALT 13  Recent Lipid Panel    Component Value Date/Time   CHOL 137 06/12/2017 0911   TRIG 73 06/12/2017 0911   HDL 37 (L) 06/12/2017 0911   CHOLHDL 3.7 06/12/2017 0911   CHOLHDL 3.7 02/07/2013 0458   VLDL 16 02/07/2013 0458   LDLCALC 85 06/12/2017 0911    Physical Exam:    VS:  BP 124/82   Pulse 72   Ht 5\' 7"  (1.702 m)   Wt 167 lb (75.8 kg)   BMI 26.16 kg/m     Wt Readings from Last 3 Encounters:  05/24/18 167 lb (75.8 kg)  04/25/17 170 lb (77.1 kg)  04/17/17 170 lb (77.1 kg)     GEN:  Well nourished, well developed in no acute distress HEENT: Normal NECK: No JVD. LYMPHATICS: No lymphadenopathy CARDIAC: RRR, no murmur, no gallop, no edema. VASCULAR: 2+ bilateral symmetrical radial pulses.  No bruits. RESPIRATORY:  Clear to auscultation without rales, wheezing or rhonchi  ABDOMEN: Soft, non-tender, non-distended, No pulsatile mass, MUSCULOSKELETAL: No deformity  SKIN: Warm and dry NEUROLOGIC:   Alert and oriented x 3 PSYCHIATRIC:  Normal affect   ASSESSMENT:    1. Atherosclerosis of native coronary artery of native heart without angina pectoris   2. Chronic kidney disease (CKD), stage III (moderate) (HCC)   3. Essential hypertension, benign   4. Pure hypercholesterolemia   5. Cerebral embolism    PLAN:    In order of problems listed above:  1. Stable without anginal complaints.  Status post PCI for acute coronary syndrome 2014.  Does not use nitroglycerin.  No symptoms since index event.  Risk factor modification to hit targets to prevent secondary events: LDL less than 70, blood pressure 130/80 mmHg, A1c less than 7, and moderate aerobic activity equal to or greater than 150 minutes/week. 2. Low-salt diet with target blood pressure 130/80 mmHg. 3. Target LDL less than 70. 4. Not addressed and asymptomatic  Clinical follow-up in 1 year.  Okay to use phosphodiesterase 5 inhibitor therapy in the form of Viagra.   Medication Adjustments/Labs and Tests Ordered: Current medicines are reviewed at length with the patient today.  Concerns regarding medicines are outlined above.  Orders Placed This Encounter  Procedures  . EKG 12-Lead   No orders of the defined types were placed in this encounter.   Patient Instructions  Medication Instructions:  Your physician recommends that you continue on your current medications as directed. Please refer to the Current Medication list given to you today.   Labwork: None   Testing/Procedures: None   Follow-Up: Your physician wants you to follow-up in 1 year with Dr. [name]. You will receive a reminder letter in the mail two months in advance. If you don't receive a letter, please call our office to schedule the follow-up appointment.   Any Other Special Instructions Will Be Listed Below (If Applicable).     If you need a refill on your cardiac medications before your next appointment, please call your pharmacy.       Signed, Sinclair Grooms, MD  05/24/2018 5:09 PM    [facility]

## 2018-05-24 ENCOUNTER — Encounter: Payer: Self-pay | Admitting: Interventional Cardiology

## 2018-05-24 ENCOUNTER — Ambulatory Visit (INDEPENDENT_AMBULATORY_CARE_PROVIDER_SITE_OTHER): Payer: Medicare Other | Admitting: Interventional Cardiology

## 2018-05-24 VITALS — BP 124/82 | HR 72 | Ht 67.0 in | Wt 167.0 lb

## 2018-05-24 DIAGNOSIS — N183 Chronic kidney disease, stage 3 unspecified: Secondary | ICD-10-CM

## 2018-05-24 DIAGNOSIS — I669 Occlusion and stenosis of unspecified cerebral artery: Secondary | ICD-10-CM | POA: Diagnosis not present

## 2018-05-24 DIAGNOSIS — E7800 Pure hypercholesterolemia, unspecified: Secondary | ICD-10-CM

## 2018-05-24 DIAGNOSIS — I2510 Atherosclerotic heart disease of native coronary artery without angina pectoris: Secondary | ICD-10-CM | POA: Diagnosis not present

## 2018-05-24 DIAGNOSIS — I10 Essential (primary) hypertension: Secondary | ICD-10-CM

## 2018-05-24 NOTE — Patient Instructions (Signed)

## 2018-08-30 DIAGNOSIS — M79604 Pain in right leg: Secondary | ICD-10-CM | POA: Diagnosis not present

## 2018-08-30 DIAGNOSIS — M25551 Pain in right hip: Secondary | ICD-10-CM | POA: Diagnosis not present

## 2018-08-30 DIAGNOSIS — Z720 Tobacco use: Secondary | ICD-10-CM | POA: Diagnosis not present

## 2018-09-01 ENCOUNTER — Other Ambulatory Visit: Payer: Self-pay | Admitting: Interventional Cardiology

## 2018-10-21 DIAGNOSIS — Z720 Tobacco use: Secondary | ICD-10-CM | POA: Diagnosis not present

## 2018-10-21 DIAGNOSIS — M545 Low back pain: Secondary | ICD-10-CM | POA: Diagnosis not present

## 2019-02-14 DIAGNOSIS — N529 Male erectile dysfunction, unspecified: Secondary | ICD-10-CM | POA: Diagnosis not present

## 2019-02-14 DIAGNOSIS — I2510 Atherosclerotic heart disease of native coronary artery without angina pectoris: Secondary | ICD-10-CM | POA: Diagnosis not present

## 2019-02-14 DIAGNOSIS — Z720 Tobacco use: Secondary | ICD-10-CM | POA: Diagnosis not present

## 2019-02-14 DIAGNOSIS — N400 Enlarged prostate without lower urinary tract symptoms: Secondary | ICD-10-CM | POA: Diagnosis not present

## 2019-02-14 DIAGNOSIS — N183 Chronic kidney disease, stage 3 (moderate): Secondary | ICD-10-CM | POA: Diagnosis not present

## 2019-02-14 DIAGNOSIS — E7800 Pure hypercholesterolemia, unspecified: Secondary | ICD-10-CM | POA: Diagnosis not present

## 2019-02-14 DIAGNOSIS — Z0000 Encounter for general adult medical examination without abnormal findings: Secondary | ICD-10-CM | POA: Diagnosis not present

## 2019-02-14 DIAGNOSIS — K219 Gastro-esophageal reflux disease without esophagitis: Secondary | ICD-10-CM | POA: Diagnosis not present

## 2019-02-14 DIAGNOSIS — M549 Dorsalgia, unspecified: Secondary | ICD-10-CM | POA: Diagnosis not present

## 2019-02-19 DIAGNOSIS — Z125 Encounter for screening for malignant neoplasm of prostate: Secondary | ICD-10-CM | POA: Diagnosis not present

## 2019-02-19 DIAGNOSIS — N183 Chronic kidney disease, stage 3 (moderate): Secondary | ICD-10-CM | POA: Diagnosis not present

## 2019-02-19 DIAGNOSIS — E7800 Pure hypercholesterolemia, unspecified: Secondary | ICD-10-CM | POA: Diagnosis not present

## 2019-07-15 NOTE — Progress Notes (Signed)
Cardiology Office Note:    Date:  07/16/2019   ID:  Daniel Cox, DOB [birth year], MRN [id number]  PCP:  Mayra Neer, MD  Cardiologist:  Sinclair Grooms, MD   Referring MD: Mayra Neer, MD   Chief Complaint  Patient presents with  . Coronary Artery Disease    History of Present Illness:    Daniel Cox is a 73 y.o. male with a hx of CAD, LAD drug-eluting stent during non-ST elevation MI in 2014, hypertension, left ventricular hypertrophy, embolic CVA complicating coronary angiography, and chronic back discomfort.   Daniel Cox occasional Discomfort in the left calf.  This does not occur with walking or physical activity.  He has not had discomfort in his chest.  He does fatigue easier than prior.  He is having no chest discomfort.  He denies orthopnea.  He is not having palpitations and has not had syncope.  He did have metoprolol dose decreased to 25 mg/day due to bradycardia.  He has tolerated this reduction in dose without difficulty.  Consider discontinuation of metoprolol altogether.  Past Medical History:  Diagnosis Date  . Chronic kidney disease   . Degenerative disc disease, cervical   . Degenerative lumbar disc   . Erectile dysfunction   . Hyperlipidemia     Past Surgical History:  Procedure Laterality Date  . Appendectony    . COLONOSCOPY WITH PROPOFOL N/A 08/16/2015   Procedure: COLONOSCOPY WITH PROPOFOL;  Surgeon: Garlan Fair, MD;  Location: [facility];  Service: Endoscopy;  Laterality: N/A;  . LEFT HEART CATHETERIZATION WITH CORONARY ANGIOGRAM N/A 02/06/2013   Procedure: LEFT HEART CATHETERIZATION WITH CORONARY ANGIOGRAM;  Surgeon: Sinclair Grooms, MD;  Location: [facility] CATH LAB;  Service: Cardiovascular;  Laterality: N/A;  . PERCUTANEOUS CORONARY STENT INTERVENTION (PCI-S) N/A 02/10/2013   Procedure: PERCUTANEOUS CORONARY STENT INTERVENTION (PCI-S);  Surgeon: Sinclair Grooms, MD;  Location: [facility] CATH LAB;  Service: Cardiovascular;   Laterality: N/A;  . Right shoulder      Current Medications: Current Meds  Medication Sig  . acetaminophen (TYLENOL) 500 MG tablet Take 1,000 mg by mouth every 6 (six) hours as needed for mild pain, fever or headache.   Marland Kitchen aspirin EC 81 MG tablet Take 1 tablet (81 mg total) by mouth daily.  Marland Kitchen atorvastatin (LIPITOR) 80 MG tablet Take 80 mg by mouth daily.  . cetirizine (ZYRTEC) 10 MG tablet Take 10 mg by mouth daily as needed for allergies.  . cholecalciferol (VITAMIN D) 1000 UNITS tablet Take 1,000 Units by mouth every morning.  Marland Kitchen lisinopril (PRINIVIL,ZESTRIL) 10 MG tablet Take 10 mg by mouth daily.  . metoprolol succinate (TOPROL-XL) 25 MG 24 hr tablet Take 25 mg by mouth daily.  . Multiple Vitamins-Minerals (MULTIVITAMIN WITH MINERALS) tablet Take 1 tablet by mouth every morning.  . nitroGLYCERIN (NITROSTAT) 0.4 MG SL tablet Place 1 tablet (0.4 mg total) under the tongue every 5 (five) minutes x 3 doses as needed for chest pain.  . NON FORMULARY CPAP Machine - Use as directed at bedtime.  . pregabalin (LYRICA) 75 MG capsule Take 150 mg by mouth 2 (two) times daily.  . sildenafil (VIAGRA) 100 MG tablet Take 100 mg by mouth as needed.     Allergies:   Penicillins   Social History   Socioeconomic History  . Marital status: Married    Spouse name: Not on file  . Number of children: 3  . Years of education: Not on file  .  Highest education level: Not on file  Occupational History  . Not on file  Social Needs  . Financial resource strain: Not on file  . Food insecurity    Worry: Not on file    Inability: Not on file  . Transportation needs    Medical: Not on file    Non-medical: Not on file  Tobacco Use  . Smoking status: Former Smoker    Packs/day: 0.50    Years: 30.00    Pack years: 15.00  . Smokeless tobacco: Never Used  . Tobacco comment: 2 weeks ago from 06-04-13  Substance and Sexual Activity  . Alcohol use: No  . Drug use: No  . Sexual activity: Yes  Lifestyle  .  Physical activity    Days per week: Not on file    Minutes per session: Not on file  . Stress: Not on file  Relationships  . Social Herbalist on phone: Not on file    Gets together: Not on file    Attends religious service: Not on file    Active member of club or organization: Not on file    Attends meetings of clubs or organizations: Not on file    Relationship status: Not on file  Other Topics Concern  . Not on file  Social History Narrative  . Not on file     Family History: The patient's family history includes Colon cancer in his sister; Diabetes in his sister; Heart attack in his brother, brother, father, and mother; Heart disease in his sister.  ROS:   Please see the history of present illness.    Otherwise no complaints.  No claudication-like symptoms.  All other systems reviewed and are negative.  EKGs/Labs/Other Studies Reviewed:    The following studies were reviewed today: No new data  EKG:  EKG sinus bradycardia, 50 bpm, otherwise unremarkable.  Recent Labs: No results found for requested labs within last 8760 hours.  Recent Lipid Panel    Component Value Date/Time   CHOL 137 06/12/2017 0911   TRIG 73 06/12/2017 0911   HDL 37 (L) 06/12/2017 0911   CHOLHDL 3.7 06/12/2017 0911   CHOLHDL 3.7 02/07/2013 0458   VLDL 16 02/07/2013 0458   LDLCALC 85 06/12/2017 0911    Physical Exam:    VS:  BP 132/80   Pulse (!) 50   Ht 5\' 7"  (1.702 m)   Wt 167 lb (75.8 kg)   SpO2 98%   BMI 26.16 kg/m     Wt Readings from Last 3 Encounters:  07/16/19 167 lb (75.8 kg)  05/24/18 167 lb (75.8 kg)  04/25/17 170 lb (77.1 kg)     GEN: Appears younger than stated age.. No acute distress HEENT: Normal NECK: No JVD. LYMPHATICS: No lymphadenopathy CARDIAC:  RRR without murmur, gallop, or edema. VASCULAR: 2+ bilateral dorsalis pulses. . No bruits. RESPIRATORY:  Clear to auscultation without rales, wheezing or rhonchi  ABDOMEN: Soft, non-tender, non-distended,  No pulsatile mass, MUSCULOSKELETAL: No deformity  SKIN: Warm and dry NEUROLOGIC:  Alert and oriented x 3 PSYCHIATRIC:  Normal affect   ASSESSMENT:    1. Atherosclerosis of native coronary artery of native heart without angina pectoris   2. Stage 3b chronic kidney disease   3. Essential hypertension, benign   4. Pure hypercholesterolemia   5. Educated about COVID-19 virus infection   6. Sinus bradycardia    PLAN:    In order of problems listed above:  1. Stable risk factor  profile.  Secondary prevention discussed in detail. 2. Most recent creatinine 1.45 in June 2016. 3. Pressure control is excellent. 4. Most recent LDL was 78 on max dose Lipitor. 5. 3W's is understood and endorsed into lifestyle action. 6. Likely has sinus node dysfunction.  Metoprolol has been decreased and may need to be discontinued.  The slow heart rate may be a source of his exertional fatigue.  Consider discontinuation if fatigue continues to be a problem.  Overall education and awareness concerning primary/secondary risk prevention was discussed in detail: LDL less than 70, hemoglobin A1c less than 7, blood pressure target less than 130/80 mmHg, >150 minutes of moderate aerobic activity per week, avoidance of smoking, weight control (via diet and exercise), and continued surveillance/management of/for obstructive sleep apnea.   Medication Adjustments/Labs and Tests Ordered: Current medicines are reviewed at length with the patient today.  Concerns regarding medicines are outlined above.  No orders of the defined types were placed in this encounter.  No orders of the defined types were placed in this encounter.   Patient Instructions  Medication Instructions:  Your physician recommends that you continue on your current medications as directed. Please refer to the Current Medication list given to you today.  *If you need a refill on your cardiac medications before your next appointment, please call your  pharmacy*  Lab Work: None If you have labs (blood work) drawn today and your tests are completely normal, you will receive your results only by: Marland Kitchen MyChart Message (if you have MyChart) OR . A paper copy in the mail If you have any lab test that is abnormal or we need to change your treatment, we will call you to review the results.  Testing/Procedures: None  Follow-Up: At [facility], you and your health needs are our priority.  As part of our continuing mission to provide you with exceptional heart care, we have created designated Provider Care Teams.  These Care Teams include your primary Cardiologist (physician) and Advanced Practice Providers (APPs -  Physician Assistants and Nurse Practitioners) who all work together to provide you with the care you need, when you need it.  Your next appointment:   12 month(s)  The format for your next appointment:   In Person  Provider:   You may see Sinclair Grooms, MD or one of the following Advanced Practice Providers on your designated Care Team:    Truitt Merle, NP  Cecilie Kicks, NP  Kathyrn Drown, NP   Other Instructions      Signed, Sinclair Grooms, MD  07/16/2019 2:02 PM    [place]

## 2019-07-16 ENCOUNTER — Encounter: Payer: Self-pay | Admitting: Interventional Cardiology

## 2019-07-16 ENCOUNTER — Other Ambulatory Visit: Payer: Self-pay

## 2019-07-16 ENCOUNTER — Ambulatory Visit: Payer: Medicare HMO | Admitting: Interventional Cardiology

## 2019-07-16 VITALS — BP 132/80 | HR 50 | Ht 67.0 in | Wt 167.0 lb

## 2019-07-16 DIAGNOSIS — Z7189 Other specified counseling: Secondary | ICD-10-CM

## 2019-07-16 DIAGNOSIS — I129 Hypertensive chronic kidney disease with stage 1 through stage 4 chronic kidney disease, or unspecified chronic kidney disease: Secondary | ICD-10-CM | POA: Diagnosis not present

## 2019-07-16 DIAGNOSIS — N1832 Chronic kidney disease, stage 3b: Secondary | ICD-10-CM | POA: Diagnosis not present

## 2019-07-16 DIAGNOSIS — E7800 Pure hypercholesterolemia, unspecified: Secondary | ICD-10-CM | POA: Diagnosis not present

## 2019-07-16 DIAGNOSIS — I2510 Atherosclerotic heart disease of native coronary artery without angina pectoris: Secondary | ICD-10-CM | POA: Diagnosis not present

## 2019-07-16 DIAGNOSIS — I10 Essential (primary) hypertension: Secondary | ICD-10-CM

## 2019-07-16 DIAGNOSIS — R001 Bradycardia, unspecified: Secondary | ICD-10-CM | POA: Diagnosis not present

## 2019-07-16 NOTE — Patient Instructions (Signed)

## 2019-07-17 NOTE — Addendum Note (Signed)
Addended by: Carylon Perches on: 07/17/2019 03:49 PM   Modules accepted: Orders

## 2020-06-24 DIAGNOSIS — Z23 Encounter for immunization: Secondary | ICD-10-CM | POA: Diagnosis not present

## 2020-10-23 ENCOUNTER — Other Ambulatory Visit: Payer: Self-pay

## 2020-10-23 ENCOUNTER — Inpatient Hospital Stay (HOSPITAL_COMMUNITY)
Admission: EM | Disposition: A | Discharge status: Home / Self Care | Payer: Self-pay | Source: Home / Self Care | Attending: Cardiovascular Disease

## 2020-10-23 ENCOUNTER — Emergency Department (HOSPITAL_COMMUNITY): Payer: Medicare HMO

## 2020-10-23 ENCOUNTER — Inpatient Hospital Stay (HOSPITAL_COMMUNITY): Payer: Medicare HMO

## 2020-10-23 ENCOUNTER — Inpatient Hospital Stay (HOSPITAL_COMMUNITY)
Admission: EM | Admit: 2020-10-23 | Discharge: 2020-10-26 | DRG: 282 | Disposition: A | Discharge status: Home / Self Care | Payer: Medicare HMO | Attending: Interventional Cardiology | Admitting: Interventional Cardiology

## 2020-10-23 DIAGNOSIS — R079 Chest pain, unspecified: Secondary | ICD-10-CM | POA: Diagnosis not present

## 2020-10-23 DIAGNOSIS — Z8249 Family history of ischemic heart disease and other diseases of the circulatory system: Secondary | ICD-10-CM

## 2020-10-23 DIAGNOSIS — I129 Hypertensive chronic kidney disease with stage 1 through stage 4 chronic kidney disease, or unspecified chronic kidney disease: Secondary | ICD-10-CM | POA: Diagnosis present

## 2020-10-23 DIAGNOSIS — E785 Hyperlipidemia, unspecified: Secondary | ICD-10-CM | POA: Diagnosis not present

## 2020-10-23 DIAGNOSIS — I2510 Atherosclerotic heart disease of native coronary artery without angina pectoris: Secondary | ICD-10-CM | POA: Diagnosis present

## 2020-10-23 DIAGNOSIS — I213 ST elevation (STEMI) myocardial infarction of unspecified site: Secondary | ICD-10-CM | POA: Diagnosis not present

## 2020-10-23 DIAGNOSIS — Z955 Presence of coronary angioplasty implant and graft: Secondary | ICD-10-CM | POA: Diagnosis not present

## 2020-10-23 DIAGNOSIS — I214 Non-ST elevation (NSTEMI) myocardial infarction: Secondary | ICD-10-CM

## 2020-10-23 DIAGNOSIS — Z20822 Contact with and (suspected) exposure to covid-19: Secondary | ICD-10-CM | POA: Diagnosis not present

## 2020-10-23 DIAGNOSIS — I252 Old myocardial infarction: Secondary | ICD-10-CM | POA: Diagnosis not present

## 2020-10-23 DIAGNOSIS — Z8673 Personal history of transient ischemic attack (TIA), and cerebral infarction without residual deficits: Secondary | ICD-10-CM

## 2020-10-23 DIAGNOSIS — N1832 Chronic kidney disease, stage 3b: Secondary | ICD-10-CM | POA: Diagnosis present

## 2020-10-23 DIAGNOSIS — I249 Acute ischemic heart disease, unspecified: Secondary | ICD-10-CM | POA: Diagnosis not present

## 2020-10-23 DIAGNOSIS — Z880 Allergy status to penicillin: Secondary | ICD-10-CM

## 2020-10-23 DIAGNOSIS — Z79899 Other long term (current) drug therapy: Secondary | ICD-10-CM | POA: Diagnosis not present

## 2020-10-23 DIAGNOSIS — Z7982 Long term (current) use of aspirin: Secondary | ICD-10-CM | POA: Diagnosis not present

## 2020-10-23 DIAGNOSIS — R0789 Other chest pain: Secondary | ICD-10-CM | POA: Diagnosis not present

## 2020-10-23 DIAGNOSIS — Z87891 Personal history of nicotine dependence: Secondary | ICD-10-CM | POA: Diagnosis not present

## 2020-10-23 DIAGNOSIS — N529 Male erectile dysfunction, unspecified: Secondary | ICD-10-CM | POA: Diagnosis present

## 2020-10-23 DIAGNOSIS — I499 Cardiac arrhythmia, unspecified: Secondary | ICD-10-CM | POA: Diagnosis not present

## 2020-10-23 DIAGNOSIS — R0902 Hypoxemia: Secondary | ICD-10-CM | POA: Diagnosis not present

## 2020-10-23 DIAGNOSIS — I2121 ST elevation (STEMI) myocardial infarction involving left circumflex coronary artery: Secondary | ICD-10-CM | POA: Diagnosis present

## 2020-10-23 DIAGNOSIS — I10 Essential (primary) hypertension: Secondary | ICD-10-CM | POA: Diagnosis not present

## 2020-10-23 HISTORY — PX: LEFT HEART CATH AND CORONARY ANGIOGRAPHY: CATH118249

## 2020-10-23 HISTORY — PX: CORONARY/GRAFT ACUTE MI REVASCULARIZATION: CATH118305

## 2020-10-23 LAB — HEMOGLOBIN A1C
Hgb A1c MFr Bld: 6.2 % — ABNORMAL HIGH (ref 4.8–5.6)
Mean Plasma Glucose: 131.24 mg/dL

## 2020-10-23 LAB — COMPREHENSIVE METABOLIC PANEL
ALT: 19 U/L (ref 0–44)
AST: 32 U/L (ref 15–41)
Albumin: 3.5 g/dL (ref 3.5–5.0)
Alkaline Phosphatase: 94 U/L (ref 38–126)
Anion gap: 12 (ref 5–15)
BUN: 12 mg/dL (ref 8–23)
CO2: 19 mmol/L — ABNORMAL LOW (ref 22–32)
Calcium: 9.1 mg/dL (ref 8.9–10.3)
Chloride: 109 mmol/L (ref 98–111)
Creatinine, Ser: 1.51 mg/dL — ABNORMAL HIGH (ref 0.61–1.24)
GFR, Estimated: 48 mL/min — ABNORMAL LOW (ref >60)
Glucose, Bld: 115 mg/dL — ABNORMAL HIGH (ref 70–99)
Potassium: 4 mmol/L (ref 3.5–5.1)
Sodium: 140 mmol/L (ref 135–145)
Total Bilirubin: 0.7 mg/dL (ref 0.3–1.2)
Total Protein: 6.4 g/dL — ABNORMAL LOW (ref 6.5–8.1)

## 2020-10-23 LAB — LIPID PANEL
Cholesterol: 132 mg/dL (ref 0–200)
HDL: 41 mg/dL (ref >40)
LDL Cholesterol: 77 mg/dL (ref 0–99)
Total CHOL/HDL Ratio: 3.2 RATIO
Triglycerides: 68 mg/dL (ref <150)
VLDL: 14 mg/dL (ref 0–40)

## 2020-10-23 LAB — I-STAT CHEM 8, ED
BUN: 14 mg/dL (ref 8–23)
Calcium, Ion: 1.08 mmol/L — ABNORMAL LOW (ref 1.15–1.40)
Chloride: 107 mmol/L (ref 98–111)
Creatinine, Ser: 1.5 mg/dL — ABNORMAL HIGH (ref 0.61–1.24)
Glucose, Bld: 112 mg/dL — ABNORMAL HIGH (ref 70–99)
HCT: 38 % — ABNORMAL LOW (ref 39.0–52.0)
Hemoglobin: 12.9 g/dL — ABNORMAL LOW (ref 13.0–17.0)
Potassium: 3.9 mmol/L (ref 3.5–5.1)
Sodium: 141 mmol/L (ref 135–145)
TCO2: 23 mmol/L (ref 22–32)

## 2020-10-23 LAB — TROPONIN I (HIGH SENSITIVITY)
Troponin I (High Sensitivity): 4 ng/L (ref <18)
Troponin I (High Sensitivity): 120 ng/L (ref <18)

## 2020-10-23 LAB — GLUCOSE, CAPILLARY: Glucose-Capillary: 98 mg/dL (ref 70–99)

## 2020-10-23 LAB — PROTIME-INR
INR: 1 (ref 0.8–1.2)
Prothrombin Time: 12.7 seconds (ref 11.4–15.2)

## 2020-10-23 LAB — CBC WITH DIFFERENTIAL/PLATELET
Abs Immature Granulocytes: 0.02 10*3/uL (ref 0.00–0.07)
Basophils Absolute: 0 10*3/uL (ref 0.0–0.1)
Basophils Relative: 0 %
Eosinophils Absolute: 0.4 10*3/uL (ref 0.0–0.5)
Eosinophils Relative: 6 %
HCT: 40.7 % (ref 39.0–52.0)
Hemoglobin: 13 g/dL (ref 13.0–17.0)
Immature Granulocytes: 0 %
Lymphocytes Relative: 59 %
Lymphs Abs: 3.2 10*3/uL (ref 0.7–4.0)
MCH: 33.3 pg (ref 26.0–34.0)
MCHC: 31.9 g/dL (ref 30.0–36.0)
MCV: 104.4 fL — ABNORMAL HIGH (ref 80.0–100.0)
Monocytes Absolute: 0.4 10*3/uL (ref 0.1–1.0)
Monocytes Relative: 8 %
Neutro Abs: 1.5 10*3/uL — ABNORMAL LOW (ref 1.7–7.7)
Neutrophils Relative %: 27 %
Platelets: 146 10*3/uL — ABNORMAL LOW (ref 150–400)
RBC: 3.9 MIL/uL — ABNORMAL LOW (ref 4.22–5.81)
RDW: 12.1 % (ref 11.5–15.5)
WBC: 5.6 10*3/uL (ref 4.0–10.5)
nRBC: 0 % (ref 0.0–0.2)

## 2020-10-23 LAB — CBC
HCT: 38.7 % — ABNORMAL LOW (ref 39.0–52.0)
Hemoglobin: 13.3 g/dL (ref 13.0–17.0)
MCH: 33.8 pg (ref 26.0–34.0)
MCHC: 34.4 g/dL (ref 30.0–36.0)
MCV: 98.2 fL (ref 80.0–100.0)
Platelets: 197 10*3/uL (ref 150–400)
RBC: 3.94 MIL/uL — ABNORMAL LOW (ref 4.22–5.81)
RDW: 12 % (ref 11.5–15.5)
WBC: 8.6 10*3/uL (ref 4.0–10.5)
nRBC: 0 % (ref 0.0–0.2)

## 2020-10-23 LAB — RESP PANEL BY RT-PCR (FLU A&B, COVID) ARPGX2
Influenza A by PCR: NEGATIVE
Influenza B by PCR: NEGATIVE
SARS Coronavirus 2 by RT PCR: NEGATIVE

## 2020-10-23 LAB — ECHOCARDIOGRAM COMPLETE
Area-P 1/2: 2.06 cm2
Height: 68 in
S' Lateral: 3 cm
Weight: 2784 oz

## 2020-10-23 LAB — HEPARIN LEVEL (UNFRACTIONATED): Heparin Unfractionated: 0.43 IU/mL (ref 0.30–0.70)

## 2020-10-23 LAB — MRSA PCR SCREENING: MRSA by PCR: NEGATIVE

## 2020-10-23 LAB — APTT: aPTT: 20 seconds — ABNORMAL LOW (ref 24–36)

## 2020-10-23 SURGERY — LEFT HEART CATH AND CORONARY ANGIOGRAPHY
Anesthesia: LOCAL

## 2020-10-23 MED ORDER — HYDRALAZINE HCL 20 MG/ML IJ SOLN
10.0000 mg | INTRAMUSCULAR | Status: AC | PRN
  Administered 2020-10-23: 10 mg via INTRAVENOUS
  Filled 2020-10-23: qty 1

## 2020-10-23 MED ORDER — HEPARIN SODIUM (PORCINE) 5000 UNIT/ML IJ SOLN: 60.0000 [IU]/kg | Freq: Once | INTRAMUSCULAR | Status: DC

## 2020-10-23 MED ORDER — MORPHINE SULFATE (PF) 2 MG/ML IV SOLN
2.0000 mg | INTRAVENOUS | Status: DC | PRN
  Administered 2020-10-23 (×2): 2 mg via INTRAVENOUS
  Filled 2020-10-23 (×3): qty 1

## 2020-10-23 MED ORDER — VERAPAMIL HCL 2.5 MG/ML IV SOLN
INTRAVENOUS | Status: AC
  Filled 2020-10-23: qty 2

## 2020-10-23 MED ORDER — PREGABALIN 75 MG PO CAPS
150.0000 mg | ORAL_CAPSULE | Freq: Two times a day (BID) | ORAL | Status: DC
  Administered 2020-10-23 – 2020-10-26 (×6): 150 mg via ORAL
  Filled 2020-10-23 (×6): qty 2

## 2020-10-23 MED ORDER — HEPARIN (PORCINE) IN NACL 1000-0.9 UT/500ML-% IV SOLN
INTRAVENOUS | Status: AC
  Filled 2020-10-23: qty 1000

## 2020-10-23 MED ORDER — TIROFIBAN (AGGRASTAT) BOLUS VIA INFUSION
25.0000 ug/kg | Freq: Once | INTRAVENOUS | Status: AC
  Administered 2020-10-23: 1972.5 ug via INTRAVENOUS
  Filled 2020-10-23: qty 40

## 2020-10-23 MED ORDER — SODIUM CHLORIDE 0.9 % IV SOLN: INTRAVENOUS | Status: AC

## 2020-10-23 MED ORDER — MORPHINE SULFATE (PF) 2 MG/ML IV SOLN
2.0000 mg | Freq: Once | INTRAVENOUS | Status: AC
  Administered 2020-10-23: 2 mg via INTRAVENOUS
  Filled 2020-10-23: qty 1

## 2020-10-23 MED ORDER — SODIUM CHLORIDE 0.9% FLUSH: 3.0000 mL | INTRAVENOUS | Status: DC | PRN

## 2020-10-23 MED ORDER — ASPIRIN EC 81 MG PO TBEC: 81.0000 mg | DELAYED_RELEASE_TABLET | Freq: Every day | ORAL | Status: DC

## 2020-10-23 MED ORDER — ASPIRIN 81 MG PO CHEW
81.0000 mg | CHEWABLE_TABLET | Freq: Every day | ORAL | Status: DC
  Administered 2020-10-24 – 2020-10-26 (×3): 81 mg via ORAL
  Filled 2020-10-23 (×3): qty 1

## 2020-10-23 MED ORDER — HEPARIN (PORCINE) 25000 UT/250ML-% IV SOLN
900.0000 [IU]/h | INTRAVENOUS | Status: AC
  Administered 2020-10-23: 600 [IU]/h via INTRAVENOUS
  Administered 2020-10-25: 900 [IU]/h via INTRAVENOUS
  Filled 2020-10-23 (×3): qty 250

## 2020-10-23 MED ORDER — HEPARIN SODIUM (PORCINE) 1000 UNIT/ML IJ SOLN
INTRAMUSCULAR | Status: DC | PRN
  Administered 2020-10-23: 6000 [IU] via INTRAVENOUS
  Administered 2020-10-23: 3000 [IU] via INTRAVENOUS

## 2020-10-23 MED ORDER — TIROFIBAN HCL IV 12.5 MG/250 ML
0.0750 ug/kg/min | INTRAVENOUS | Status: AC
  Administered 2020-10-23: 0.075 ug/kg/min via INTRAVENOUS
  Filled 2020-10-23: qty 250

## 2020-10-23 MED ORDER — METOCLOPRAMIDE HCL 5 MG/ML IJ SOLN
10.0000 mg | Freq: Once | INTRAMUSCULAR | Status: AC
  Administered 2020-10-23: 10 mg via INTRAVENOUS
  Filled 2020-10-23: qty 2

## 2020-10-23 MED ORDER — HEPARIN (PORCINE) IN NACL 1000-0.9 UT/500ML-% IV SOLN
INTRAVENOUS | Status: DC | PRN
  Administered 2020-10-23 (×2): 500 mL

## 2020-10-23 MED ORDER — TICAGRELOR 90 MG PO TABS
ORAL_TABLET | ORAL | Status: AC
  Filled 2020-10-23: qty 2

## 2020-10-23 MED ORDER — LIDOCAINE HCL (PF) 1 % IJ SOLN
INTRAMUSCULAR | Status: DC | PRN
  Administered 2020-10-23: 10 mL

## 2020-10-23 MED ORDER — METOPROLOL SUCCINATE ER 25 MG PO TB24
25.0000 mg | ORAL_TABLET | Freq: Every day | ORAL | Status: DC
Start: 2020-10-23 — End: 2020-10-26
  Administered 2020-10-25 – 2020-10-26 (×2): 25 mg via ORAL
  Filled 2020-10-23 (×2): qty 1

## 2020-10-23 MED ORDER — IOHEXOL 350 MG/ML SOLN
INTRAVENOUS | Status: DC | PRN
  Administered 2020-10-23: 170 mL

## 2020-10-23 MED ORDER — SODIUM CHLORIDE 0.9 % IV SOLN: INTRAVENOUS | Status: DC

## 2020-10-23 MED ORDER — ONDANSETRON HCL 4 MG/2ML IJ SOLN: 4.0000 mg | Freq: Four times a day (QID) | INTRAMUSCULAR | Status: DC | PRN

## 2020-10-23 MED ORDER — NITROGLYCERIN 1 MG/10 ML FOR IR/CATH LAB
INTRA_ARTERIAL | Status: AC
  Filled 2020-10-23: qty 10

## 2020-10-23 MED ORDER — CHLORHEXIDINE GLUCONATE CLOTH 2 % EX PADS
6.0000 | MEDICATED_PAD | Freq: Every day | CUTANEOUS | Status: DC
  Administered 2020-10-23: 6 via TOPICAL

## 2020-10-23 MED ORDER — SODIUM CHLORIDE 0.9 % IV SOLN: 250.0000 mL | INTRAVENOUS | Status: DC | PRN

## 2020-10-23 MED ORDER — ACETAMINOPHEN 325 MG PO TABS
650.0000 mg | ORAL_TABLET | ORAL | Status: DC | PRN
  Filled 2020-10-23: qty 2

## 2020-10-23 MED ORDER — LABETALOL HCL 5 MG/ML IV SOLN
10.0000 mg | INTRAVENOUS | Status: AC | PRN
  Filled 2020-10-23: qty 4

## 2020-10-23 MED ORDER — TICAGRELOR 90 MG PO TABS
ORAL_TABLET | ORAL | Status: DC | PRN
  Administered 2020-10-23: 180 mg via ORAL

## 2020-10-23 MED ORDER — SALINE SPRAY 0.65 % NA SOLN
1.0000 | NASAL | Status: DC | PRN
  Administered 2020-10-23: 1 via NASAL
  Filled 2020-10-23: qty 44

## 2020-10-23 MED ORDER — LIDOCAINE HCL (PF) 1 % IJ SOLN
INTRAMUSCULAR | Status: AC
  Filled 2020-10-23: qty 30

## 2020-10-23 MED ORDER — TICAGRELOR 90 MG PO TABS
90.0000 mg | ORAL_TABLET | Freq: Two times a day (BID) | ORAL | Status: DC
  Administered 2020-10-23 – 2020-10-26 (×6): 90 mg via ORAL
  Filled 2020-10-23 (×6): qty 1

## 2020-10-23 MED ORDER — ATORVASTATIN CALCIUM 80 MG PO TABS
80.0000 mg | ORAL_TABLET | Freq: Every day | ORAL | Status: DC
  Administered 2020-10-23 – 2020-10-26 (×4): 80 mg via ORAL
  Filled 2020-10-23 (×4): qty 1

## 2020-10-23 MED ORDER — SODIUM CHLORIDE 0.9% FLUSH
3.0000 mL | Freq: Two times a day (BID) | INTRAVENOUS | Status: DC
  Administered 2020-10-23 – 2020-10-26 (×6): 3 mL via INTRAVENOUS

## 2020-10-23 MED ORDER — HEPARIN SODIUM (PORCINE) 5000 UNIT/ML IJ SOLN
4000.0000 [IU] | Freq: Once | INTRAMUSCULAR | Status: AC
  Administered 2020-10-23: 4000 [IU] via INTRAVENOUS

## 2020-10-23 MED ORDER — IOHEXOL 350 MG/ML SOLN
INTRAVENOUS | Status: AC
  Filled 2020-10-23: qty 1

## 2020-10-23 MED ORDER — LISINOPRIL 10 MG PO TABS
10.0000 mg | ORAL_TABLET | Freq: Every day | ORAL | Status: DC
  Administered 2020-10-24 – 2020-10-25 (×2): 10 mg via ORAL
  Filled 2020-10-23 (×2): qty 1

## 2020-10-23 MED ORDER — ATORVASTATIN CALCIUM 80 MG PO TABS: 80.0000 mg | ORAL_TABLET | Freq: Every day | ORAL | Status: DC

## 2020-10-23 SURGICAL SUPPLY — 17 items
BALLN SAPPHIRE 2.0X12 (BALLOONS) ×2
BALLOON SAPPHIRE 2.0X12 (BALLOONS) IMPLANT
CATH INFINITI 5FR MULTPACK ANG (CATHETERS) ×1 IMPLANT
CATH VISTA GUIDE 6FR XB3.5 (CATHETERS) ×1 IMPLANT
CLOSURE MYNX CONTROL 6F/7F (Vascular Products) ×1 IMPLANT
GLIDESHEATH SLEND A-KIT 6F 22G (SHEATH) IMPLANT
KIT ENCORE 26 ADVANTAGE (KITS) ×1 IMPLANT
KIT HEART LEFT (KITS) ×2 IMPLANT
PACK CARDIAC CATHETERIZATION (CUSTOM PROCEDURE TRAY) ×2 IMPLANT
SHEATH PINNACLE 6F 10CM (SHEATH) ×1 IMPLANT
SHEATH PROBE COVER 6X72 (BAG) ×1 IMPLANT
TRANSDUCER W/STOPCOCK (MISCELLANEOUS) ×2 IMPLANT
TUBING CIL FLEX 10 FLL-RA (TUBING) ×2 IMPLANT
WIRE ASAHI PROWATER 180CM (WIRE) ×1 IMPLANT
WIRE COUGAR XT STRL 190CM (WIRE) ×1 IMPLANT
WIRE EMERALD 3MM-J .035X150CM (WIRE) ×1 IMPLANT
WIRE EMERALD 3MM-J .035X260CM (WIRE) ×1 IMPLANT

## 2020-10-23 NOTE — Progress Notes (Signed)
ANTICOAGULATION CONSULT NOTE - Initial Consult  Pharmacy Consult for Heparin + tirofiban x6hr post cath Indication: chest pain/ACS  Allergies  Allergen Reactions  . Penicillins Hives, Itching and Rash    Has patient had a PCN reaction causing immediate rash, facial/tongue/throat swelling, SOB or lightheadedness with hypotension: No Has patient had a PCN reaction causing severe rash involving mucus membranes or skin necrosis: No Has patient had a PCN reaction that required hospitalization No Has patient had a PCN reaction occurring within the last 10 years: No If all of the above answers are "NO", then may proceed with Cephalosporin use.     Patient Measurements: Height: 5\' 8"  (172.7 cm) Weight: 78.9 kg (174 lb) IBW/kg (Calculated) : 68.4   Vital Signs: Temp: 97.8 F (36.6 C) (02/26 1531) Temp Source: Oral (02/26 1531) BP: 114/66 (02/26 1900) Pulse Rate: 61 (02/26 1900)  Labs: Recent Labs    10/23/20 0741 10/23/20 0748 10/23/20 1042 10/23/20 1822  HGB 13.0 12.9*  --  13.3  HCT 40.7 38.0*  --  38.7*  PLT 146*  --   --  197  APTT 20*  --   --   --   LABPROT 12.7  --   --   --   INR 1.0  --   --   --   HEPARINUNFRC  --   --   --  0.43  CREATININE 1.51* 1.50*  --   --   TROPONINIHS 4  --  120*  --     Estimated Creatinine Clearance: 42.4 mL/min (A) (by C-G formula based on SCr of 1.5 mg/dL (H)).   Medical History: Past Medical History:  Diagnosis Date  . Chronic kidney disease   . Degenerative disc disease, cervical   . Degenerative lumbar disc   . Erectile dysfunction   . Hyperlipidemia      Assessment:  74yom admitted for STEMI, no intervention secondary to severe tortuosity. Discussed with MD decided to run tirofiban for 6hr and heparin x48hr for ACS.   Tirofiban complete at 6pm  Heparin drip 600 uts/hr HL 0.48 at goal.  Will decrease slightly to ensure no accumulation overnight.   CBC stable post cath   Goal of Therapy:  Heparin level 0.3-0.7  units/ml Monitor platelets by anticoagulation protocol: Yes   Plan:  completed tirofiban Decrease Heparin drip 500 uts/hr  Check HL and cbc  daily   Bonnita Nasuti Pharm.D. CPP, BCPS Clinical Pharmacist [phone] 10/23/2020 7:48 PM

## 2020-10-23 NOTE — ED Triage Notes (Signed)
Arrived by  [facility]. [facility] reports that pt called for acute onset of CP that started approx 5 am, with additional c/o fatigue and left arm pain however pt does have chronic left arm pain. EMS transmitted multiple 12 lead ECGs which resulted in code STEMI activation by cardiology. 324 ASA administered PTA by EMS.

## 2020-10-23 NOTE — Progress Notes (Signed)
ANTICOAGULATION CONSULT NOTE - Initial Consult  Pharmacy Consult for Heparin + tirofiban x6hr post cath Indication: chest pain/ACS  Allergies  Allergen Reactions  . Penicillins Hives, Itching and Rash    Has patient had a PCN reaction causing immediate rash, facial/tongue/throat swelling, SOB or lightheadedness with hypotension: No Has patient had a PCN reaction causing severe rash involving mucus membranes or skin necrosis: No Has patient had a PCN reaction that required hospitalization No Has patient had a PCN reaction occurring within the last 10 years: No If all of the above answers are "NO", then may proceed with Cephalosporin use.     Patient Measurements: Height: 5\' 8"  (172.7 cm) Weight: 78.9 kg (174 lb) IBW/kg (Calculated) : 68.4   Vital Signs: Temp: 97.8 F (36.6 C) (02/26 1531) Temp Source: Oral (02/26 1531) BP: 150/91 (02/26 1800) Pulse Rate: 69 (02/26 1821)  Labs: Recent Labs    10/23/20 0741 10/23/20 0748 10/23/20 1042 10/23/20 1822  HGB 13.0 12.9*  --  13.3  HCT 40.7 38.0*  --  38.7*  PLT 146*  --   --  197  APTT 20*  --   --   --   LABPROT 12.7  --   --   --   INR 1.0  --   --   --   HEPARINUNFRC  --   --   --  0.43  CREATININE 1.51* 1.50*  --   --   TROPONINIHS 4  --  120*  --     Estimated Creatinine Clearance: 42.4 mL/min (A) (by C-G formula based on SCr of 1.5 mg/dL (H)).   Medical History: Past Medical History:  Diagnosis Date  . Chronic kidney disease   . Degenerative disc disease, cervical   . Degenerative lumbar disc   . Erectile dysfunction   . Hyperlipidemia      Assessment:  74yom admitted for STEMI, no intervention secondary to severe tortuosity. Discussed with MD decided to run tirofiban for 6hr and heparin x48hr for ACS.    Goal of Therapy:  Heparin level 0.3-0.7 units/ml Monitor platelets by anticoagulation protocol: Yes   Plan:  Tirofiban bolus 2mcg/kg x1 then drip 0.016mcg/kg/min x6hr  Heparin drip 600 uts/hr   Check HL and cbc in 6r and then daily   Bonnita Nasuti Pharm.D. CPP, BCPS Clinical Pharmacist [phone] 10/23/2020 7:34 PM

## 2020-10-23 NOTE — ED Provider Notes (Signed)
[facility] EMERGENCY DEPARTMENT Provider Note   CSN: [id number] Arrival date & time: 10/23/20  0732     History Chief Complaint  Patient presents with  . Code STEMI    Daniel Cox is a 75 y.o. male.  Patient with hx cad, c/o chest pain. Symptoms acute onset this AM around 5 AM at rest, dull pain, left lateral chest, constant, non radiating, not pleuritic. Mild sob and diaphoresis. No nausea/vomiting. States similar symptoms in 2014, reports prior stent. Denies other recent chest pain or discomfort. No cough or uri symptoms. No fever or chills. No heartburn. No chest wall injury or strain. Denies leg pain or swelling. No hx dvt or pe. No radiation of pain, no back, neck, or flank pain. EMS reports pt had 324 mg ASA. Pt reports pain improved from prior but not resolved.   The history is provided by the patient and the EMS personnel.       Past Medical History:  Diagnosis Date  . Chronic kidney disease   . Degenerative disc disease, cervical   . Degenerative lumbar disc   . Erectile dysfunction   . Hyperlipidemia     Patient Active Problem List   Diagnosis Date Noted  . Left ventricular hypertrophy 02/12/2015  . Coronary atherosclerosis of native coronary artery 06/04/2013  . Essential hypertension, benign 06/04/2013  . Cerebral embolism 02/07/2013    Class: Acute  . Acute coronary syndrome ([place]) 02/04/2013    Class: Acute  . BPH (benign prostatic hypertrophy) 02/04/2013    Class: Chronic  . Chronic kidney disease (CKD), stage III (moderate) ([place]) 02/04/2013  . Hyperlipidemia 02/04/2013  . Erectile dysfunction 02/04/2013    Class: Chronic    Past Surgical History:  Procedure Laterality Date  . Appendectony    . COLONOSCOPY WITH PROPOFOL N/A 08/16/2015   Procedure: COLONOSCOPY WITH PROPOFOL;  Surgeon: Garlan Fair, MD;  Location: [facility] ENDOSCOPY;  Service: Endoscopy;  Laterality: N/A;  . LEFT HEART CATHETERIZATION WITH CORONARY ANGIOGRAM N/A  02/06/2013   Procedure: LEFT HEART CATHETERIZATION WITH CORONARY ANGIOGRAM;  Surgeon: Sinclair Grooms, MD;  Location: [facility] CATH LAB;  Service: Cardiovascular;  Laterality: N/A;  . PERCUTANEOUS CORONARY STENT INTERVENTION (PCI-S) N/A 02/10/2013   Procedure: PERCUTANEOUS CORONARY STENT INTERVENTION (PCI-S);  Surgeon: Sinclair Grooms, MD;  Location: [facility] CATH LAB;  Service: Cardiovascular;  Laterality: N/A;  . Right shoulder         Family History  Problem Relation Age of Onset  . Heart attack Mother   . Heart attack Father   . Heart disease Sister   . Heart attack Brother   . Heart attack Brother   . Diabetes Sister   . Colon cancer Sister     Social History   Tobacco Use  . Smoking status: Former Smoker    Packs/day: 0.50    Years: 30.00    Pack years: 15.00  . Smokeless tobacco: Never Used  . Tobacco comment: 2 weeks ago from 06-04-13  Substance Use Topics  . Alcohol use: No  . Drug use: No    Home Medications Prior to Admission medications   Medication Sig Start Date End Date Taking? Authorizing Provider  acetaminophen (TYLENOL) 500 MG tablet Take 1,000 mg by mouth every 6 (six) hours as needed for mild pain, fever or headache.     [provider]  aspirin EC 81 MG tablet Take 1 tablet (81 mg total) by mouth daily. 04/17/17   Daneen Schick  W, MD  atorvastatin (LIPITOR) 80 MG tablet Take 80 mg by mouth daily.    [provider]  cetirizine (ZYRTEC) 10 MG tablet Take 10 mg by mouth daily as needed for allergies.    [provider]  cholecalciferol (VITAMIN D) 1000 UNITS tablet Take 1,000 Units by mouth every morning.    [provider]  lisinopril (PRINIVIL,ZESTRIL) 10 MG tablet Take 10 mg by mouth daily.    [provider]  metoprolol succinate (TOPROL-XL) 25 MG 24 hr tablet Take 25 mg by mouth daily. 05/16/19   [provider]  Multiple Vitamins-Minerals (MULTIVITAMIN WITH MINERALS) tablet Take 1 tablet by mouth every  morning.    [provider]  nitroGLYCERIN (NITROSTAT) 0.4 MG SL tablet Place 1 tablet (0.4 mg total) under the tongue every 5 (five) minutes x 3 doses as needed for chest pain. 02/11/13   Belva Crome, MD  NON FORMULARY CPAP Machine - Use as directed at bedtime.    [provider]  pregabalin (LYRICA) 75 MG capsule Take 150 mg by mouth 2 (two) times daily. 06/20/19 06/19/20  [provider]  sildenafil (VIAGRA) 100 MG tablet Take 100 mg by mouth as needed. 05/16/19   [provider]    Allergies    Penicillins  Review of Systems   Review of Systems  Constitutional: Negative for fever.  HENT: Negative for sore throat.   Eyes: Negative for redness.  Respiratory: Negative for shortness of breath.   Cardiovascular: Positive for chest pain.  Gastrointestinal: Negative for abdominal pain and vomiting.  Genitourinary: Negative for flank pain.  Musculoskeletal: Negative for back pain and neck pain.  Skin: Negative for rash.  Neurological: Negative for headaches.  Hematological: Does not bruise/bleed easily.  Psychiatric/Behavioral: Negative for confusion.    Physical Exam Updated Vital Signs BP (!) 148/94 (BP Location: Right Arm)   Pulse (!) 44   Temp (!) 97.4 F (36.3 C) (Oral)   Resp 20   Ht 1.727 m (5\' 8" )   Wt 78.9 kg   SpO2 97%   BMI 26.46 kg/m   Physical Exam Vitals and nursing note reviewed.  Constitutional:      Appearance: Normal appearance. He is well-developed.  HENT:     Head: Atraumatic.     Nose: Nose normal.     Mouth/Throat:     Mouth: Mucous membranes are moist.     Pharynx: Oropharynx is clear.  Eyes:     General: No scleral icterus.    Conjunctiva/sclera: Conjunctivae normal.     Pupils: Pupils are equal, round, and reactive to light.  Neck:     Trachea: No tracheal deviation.  Cardiovascular:     Rate and Rhythm: Normal rate and regular rhythm.     Pulses: Normal pulses.     Heart sounds: Normal heart sounds.  No murmur heard. No friction rub. No gallop.   Pulmonary:     Effort: Pulmonary effort is normal. No accessory muscle usage or respiratory distress.     Breath sounds: Normal breath sounds.  Chest:     Chest wall: No tenderness.  Abdominal:     General: Bowel sounds are normal. There is no distension.     Palpations: Abdomen is soft.     Tenderness: There is no abdominal tenderness. There is no guarding.  Genitourinary:    Comments: No cva tenderness. Musculoskeletal:        General: No swelling or tenderness.     Cervical back:  Normal range of motion and neck supple. No rigidity.     Right lower leg: No edema.     Left lower leg: No edema.  Skin:    General: Skin is warm and dry.     Findings: No rash.  Neurological:     Mental Status: He is alert.     Comments: Alert, speech clear.   Psychiatric:        Mood and Affect: Mood normal.     ED Results / Procedures / Treatments   Labs (all labs ordered are listed, but only abnormal results are displayed) Results for orders placed or performed during the hospital encounter of 10/23/20  Resp Panel by RT-PCR (Flu A&B, Covid) Nasopharyngeal Swab   Specimen: Nasopharyngeal Swab; Nasopharyngeal(NP) swabs in vial transport medium  Result Value Ref Range   SARS Coronavirus 2 by RT PCR NEGATIVE NEGATIVE   Influenza A by PCR NEGATIVE NEGATIVE   Influenza B by PCR NEGATIVE NEGATIVE  MRSA PCR Screening   Specimen: Nasopharyngeal  Result Value Ref Range   MRSA by PCR NEGATIVE NEGATIVE  Hemoglobin A1c  Result Value Ref Range   Hgb A1c MFr Bld 6.2 (H) 4.8 - 5.6 %   Mean Plasma Glucose 131.24 mg/dL  CBC with Differential/Platelet  Result Value Ref Range   WBC 5.6 4.0 - 10.5 K/uL   RBC 3.90 (L) 4.22 - 5.81 MIL/uL   Hemoglobin 13.0 13.0 - 17.0 g/dL   HCT 40.7 39.0 - 52.0 %   MCV 104.4 (H) 80.0 - 100.0 fL   MCH 33.3 26.0 - 34.0 pg   MCHC 31.9 30.0 - 36.0 g/dL   RDW 12.1 11.5 - 15.5 %   Platelets 146 (L) 150 - 400 K/uL   nRBC 0.0  0.0 - 0.2 %   Neutrophils Relative % 27 %   Neutro Abs 1.5 (L) 1.7 - 7.7 K/uL   Lymphocytes Relative 59 %   Lymphs Abs 3.2 0.7 - 4.0 K/uL   Monocytes Relative 8 %   Monocytes Absolute 0.4 0.1 - 1.0 K/uL   Eosinophils Relative 6 %   Eosinophils Absolute 0.4 0.0 - 0.5 K/uL   Basophils Relative 0 %   Basophils Absolute 0.0 0.0 - 0.1 K/uL   Immature Granulocytes 0 %   Abs Immature Granulocytes 0.02 0.00 - 0.07 K/uL  Protime-INR  Result Value Ref Range   Prothrombin Time 12.7 11.4 - 15.2 seconds   INR 1.0 0.8 - 1.2  APTT  Result Value Ref Range   aPTT 20 (L) 24 - 36 seconds  Comprehensive metabolic panel  Result Value Ref Range   Sodium 140 135 - 145 mmol/L   Potassium 4.0 3.5 - 5.1 mmol/L   Chloride 109 98 - 111 mmol/L   CO2 19 (L) 22 - 32 mmol/L   Glucose, Bld 115 (H) 70 - 99 mg/dL   BUN 12 8 - 23 mg/dL   Creatinine, Ser 1.51 (H) 0.61 - 1.24 mg/dL   Calcium 9.1 8.9 - 10.3 mg/dL   Total Protein 6.4 (L) 6.5 - 8.1 g/dL   Albumin 3.5 3.5 - 5.0 g/dL   AST 32 15 - 41 U/L   ALT 19 0 - 44 U/L   Alkaline Phosphatase 94 38 - 126 U/L   Total Bilirubin 0.7 0.3 - 1.2 mg/dL   GFR, Estimated 48 (L) >60 mL/min   Anion gap 12 5 - 15  Lipid panel  Result Value Ref Range   Cholesterol 132 0 - 200  mg/dL   Triglycerides 68 <150 mg/dL   HDL 41 >40 mg/dL   Total CHOL/HDL Ratio 3.2 RATIO   VLDL 14 0 - 40 mg/dL   LDL Cholesterol 77 0 - 99 mg/dL  Glucose, capillary  Result Value Ref Range   Glucose-Capillary 98 70 - 99 mg/dL  I-stat chem 8, ED (not at [facility] or [facility])  Result Value Ref Range   Sodium 141 135 - 145 mmol/L   Potassium 3.9 3.5 - 5.1 mmol/L   Chloride 107 98 - 111 mmol/L   BUN 14 8 - 23 mg/dL   Creatinine, Ser 1.50 (H) 0.61 - 1.24 mg/dL   Glucose, Bld 112 (H) 70 - 99 mg/dL   Calcium, Ion 1.08 (L) 1.15 - 1.40 mmol/L   TCO2 23 22 - 32 mmol/L   Hemoglobin 12.9 (L) 13.0 - 17.0 g/dL   HCT 38.0 (L) 39.0 - 52.0 %  ECHOCARDIOGRAM COMPLETE  Result Value Ref Range   Weight 2,784 oz    Height 68 in   BP 148/100 mmHg  Troponin I (High Sensitivity)  Result Value Ref Range   Troponin I (High Sensitivity) 4 <18 ng/L  Troponin I (High Sensitivity)  Result Value Ref Range   Troponin I (High Sensitivity) 120 (HH) <18 ng/L   CARDIAC CATHETERIZATION  Result Date: 10/23/2020  Mid RCA lesion is 75% stenosed.  Dist RCA lesion is 80% stenosed.  Previously placed Prox LAD to Mid LAD stent (unknown type) is widely patent.  1st Diag lesion is 90% stenosed.  Ost Cx to Prox Cx lesion is 75% stenosed.  1st Mrg lesion is 85% stenosed.  2nd Mrg lesion is 99% stenosed.  Daniel Cox is a 75 y.o. male  [id number] LOCATION:  FACILITY: [facility] PHYSICIAN: Quay Burow, M.D. 08-15-1946 DATE OF PROCEDURE:  10/23/2020 DATE OF DISCHARGE: CARDIAC CATHETERIZATION / Attempt at PCI OM2 History obtained from chart review Mr. Marin Roberts is a 75 year old married African-American male patient of Dr. Thompson Caul with a history of PCI and drug-eluting stenting of his LAD back in 02/06/2013.  4 days prior he had attempt at PCI which was complicated by a CNS event.  He initially try to go radially but because of radial spasm and tortuosity he ultimately went through the femoral approach.  He has not been seen by Dr. [name] for several years. .Mr. Gilliand was in usual state of health up until 5:30 AM when he woke up from sleep with substernal chest pressure radiating to his left shoulder.  It was 6 out of 10.  No associated diaphoresis, nausea or vomiting.  Pain persisted and he called EMS.  EKG showed lateral ST elevation with reciprocal ST depression anterior leads.  He was given aspirin 324 mg and code STEMI activated.  Upon arrival to ER his chest pain was improved to 2 out of 10.  He was given heparin bolus.  Shortly after that he was complaining of worsening pain to 4 out of 10 with radiation to his back.  He was given morphine 2 mg.  EKG with improved ST segment.  He was brought to Cath Lab.  Currently smokes less than half  a pack a day cigarette.  He played 18 holes of golf yesterday.  Does not chest tightness or pressure.  Denies sick contact.  No fever, chills, cough, congestion, palpitation, dizziness, orthopnea, PND or syncope. PROCEDURE DESCRIPTION: The patient was brought to the second floor [place] [facility] cath lab in the postabsorptive state. He was not premedicated .  His right groin was prepped and shaved in usual sterile fashion. Xylocaine 1% was used for local anesthesia. A 6 French sheath was inserted into the right common femoral  artery using standard Seldinger technique.  Ultrasound was used to guide access.  A digital image was captured and placed the patient's chart.  5 French right left second sinus and catheters on the 5 French pigtail catheter used for selective coronary angiography and obtain left heart pressures.  Isovue dye was used for the entirety of the case (170 cc total delivered to patient).  Retroaortic, ventricular and pullback pressures were recorded.  LVEDP was measured at 25. It appeared that OM 2 was the "culprit vessel" with TIMI 0-I flow.  In addition, he had high-grade proximal AV groove circumflex stenosis and ostial/proximal OM1 stenosis as well as first diagonal branch stenosis.  He also had distal RCA stenosis.  His EKG was consistent with inferolateral STEMI. The patient received an additional 6 units of heparin plus additional 3000 as of heparin (9000 units total) with an ACT of 267.  Isovue-East for the entirety of the intervention.  Retroaortic pressures monitored during the case. Using a 6 [place] XB 3.5 cm guide catheter along with a 0.14 Prowater guidewire and a 0.14 cougar guidewire I was able to wire the circumflex but was unable to select the second obtuse marginal branch despite multiple attempts.  At the beginning of the case the OM had TIMI 0-I flow.  At the end of the case and had TIMI II-III flow with a high-grade somewhat hazy appearing ostial stenosis.  Because of the  angulation and tortuosity of the vessel I do not think it is technically suitable for PCI.  The patient was pain-free at the end of the case.   Unsuccessful attempted PCI of high-grade ostial second obtuse marginal branch stenosis secondary to severe tortuosity.  All catheter exchanges were done over a long wire in the ascending arch given the fact that he had a CNS embolic event during his previous procedure 8 years ago.  A right common femoral angiogram was performed and a Mynx closure device successfully deployed.  The patient left lab in stable condition.  Heparin will be restarted for 48 hours 4 hours after sheath removal without a bolus.  His ACS event will be treated medically.  He left the lab in stable condition. Quay Burow. MD, [facility] 10/23/2020 9:39 AM   DG Chest Port 1 View  Result Date: 10/23/2020 CLINICAL DATA:  75 year old male with history of ST-elevation myocardial infarction. EXAM: PORTABLE CHEST 1 VIEW COMPARISON:  Chest x-ray 02/04/2013. FINDINGS: Lung volumes are normal. No consolidative airspace disease. No pleural effusions. No pneumothorax. No pulmonary nodule or mass noted. Tortuosity of the descending thoracic aorta. Pulmonary vasculature and the cardiomediastinal silhouette are otherwise within normal limits. IMPRESSION: No radiographic evidence of acute cardiopulmonary disease. Electronically Signed   By: Vinnie Langton M.D.   On: 10/23/2020 08:04    EKG Sinus brady St elevation laterally c/w stemi     Radiology CARDIAC CATHETERIZATION  Result Date: 10/23/2020  Mid RCA lesion is 75% stenosed.  Dist RCA lesion is 80% stenosed.  Previously placed Prox LAD to Mid LAD stent (unknown type) is widely patent.  1st Diag lesion is 90% stenosed.  Ost Cx to Prox Cx lesion is 75% stenosed.  1st Mrg lesion is 85% stenosed.  2nd Mrg lesion is 99% stenosed.  Daniel Cox is a 75 y.o. male  [id number] LOCATION:  FACILITY: [facility] PHYSICIAN:  Quay Burow, M.D. 1946/06/06 DATE  OF PROCEDURE:  10/23/2020 DATE OF DISCHARGE: CARDIAC CATHETERIZATION / Attempt at PCI OM2 History obtained from chart review Mr. Marin Roberts is a 75 year old married African-American male patient of Dr. Thompson Caul with a history of PCI and drug-eluting stenting of his LAD back in 02/06/2013.  4 days prior he had attempt at PCI which was complicated by a CNS event.  He initially try to go radially but because of radial spasm and tortuosity he ultimately went through the femoral approach.  He has not been seen by Dr. [name] for several years. .Mr. Olguin was in usual state of health up until 5:30 AM when he woke up from sleep with substernal chest pressure radiating to his left shoulder.  It was 6 out of 10.  No associated diaphoresis, nausea or vomiting.  Pain persisted and he called EMS.  EKG showed lateral ST elevation with reciprocal ST depression anterior leads.  He was given aspirin 324 mg and code STEMI activated.  Upon arrival to ER his chest pain was improved to 2 out of 10.  He was given heparin bolus.  Shortly after that he was complaining of worsening pain to 4 out of 10 with radiation to his back.  He was given morphine 2 mg.  EKG with improved ST segment.  He was brought to Cath Lab.  Currently smokes less than half a pack a day cigarette.  He played 18 holes of golf yesterday.  Does not chest tightness or pressure.  Denies sick contact.  No fever, chills, cough, congestion, palpitation, dizziness, orthopnea, PND or syncope. PROCEDURE DESCRIPTION: The patient was brought to the second floor [place] [facility] cath lab in the postabsorptive state. He was not premedicated . His right groin was prepped and shaved in usual sterile fashion. Xylocaine 1% was used for local anesthesia. A 6 French sheath was inserted into the right common femoral  artery using standard Seldinger technique.  Ultrasound was used to guide access.  A digital image was captured and placed the patient's chart.  5 French right left second sinus  and catheters on the 5 French pigtail catheter used for selective coronary angiography and obtain left heart pressures.  Isovue dye was used for the entirety of the case (170 cc total delivered to patient).  Retroaortic, ventricular and pullback pressures were recorded.  LVEDP was measured at 25. It appeared that OM 2 was the "culprit vessel" with TIMI 0-I flow.  In addition, he had high-grade proximal AV groove circumflex stenosis and ostial/proximal OM1 stenosis as well as first diagonal branch stenosis.  He also had distal RCA stenosis.  His EKG was consistent with inferolateral STEMI. The patient received an additional 6 units of heparin plus additional 3000 as of heparin (9000 units total) with an ACT of 267.  Isovue-East for the entirety of the intervention.  Retroaortic pressures monitored during the case. Using a 6 [place] XB 3.5 cm guide catheter along with a 0.14 Prowater guidewire and a 0.14 cougar guidewire I was able to wire the circumflex but was unable to select the second obtuse marginal branch despite multiple attempts.  At the beginning of the case the OM had TIMI 0-I flow.  At the end of the case and had TIMI II-III flow with a high-grade somewhat hazy appearing ostial stenosis.  Because of the angulation and tortuosity of the vessel I do not think it is technically suitable for PCI.  The patient was pain-free at the end of the case.  Unsuccessful attempted PCI of high-grade ostial second obtuse marginal branch stenosis secondary to severe tortuosity.  All catheter exchanges were done over a long wire in the ascending arch given the fact that he had a CNS embolic event during his previous procedure 8 years ago.  A right common femoral angiogram was performed and a Mynx closure device successfully deployed.  The patient left lab in stable condition.  Heparin will be restarted for 48 hours 4 hours after sheath removal without a bolus.  His ACS event will be treated medically.  He left the lab in  stable condition. Quay Burow. MD, [facility] 10/23/2020 9:39 AM   DG Chest Port 1 View  Result Date: 10/23/2020 CLINICAL DATA:  75 year old male with history of ST-elevation myocardial infarction. EXAM: PORTABLE CHEST 1 VIEW COMPARISON:  Chest x-ray 02/04/2013. FINDINGS: Lung volumes are normal. No consolidative airspace disease. No pleural effusions. No pneumothorax. No pulmonary nodule or mass noted. Tortuosity of the descending thoracic aorta. Pulmonary vasculature and the cardiomediastinal silhouette are otherwise within normal limits. IMPRESSION: No radiographic evidence of acute cardiopulmonary disease. Electronically Signed   By: Vinnie Langton M.D.   On: 10/23/2020 08:04    Procedures Procedures   Medications Ordered in ED Medications  0.9 %  sodium chloride infusion (has no administration in time range)  0.9 %  sodium chloride infusion (has no administration in time range)  morphine 2 MG/ML injection 2 mg (has no administration in time range)  heparin injection 4,000 Units (4,000 Units Intravenous Given 10/23/20 0750)    ED Course  I have reviewed the triage vital signs and the nursing notes.  Pertinent labs & imaging results that were available during my care of the patient were reviewed by me and considered in my medical decision making (see chart for details).    MDM Rules/Calculators/A&P                         Iv ns. Continuous pulse ox and cardiac monitoring. Stat labs. Code stemi activated pta.  Cardiology emergently consulted and in ED.   Reviewed nursing notes and prior charts for additional history.   Heparin per pharmacy/ACS.  Labs reviewed/interpreted by me - k normal. Initial trop normal. Delta trop elevated.   CXR reviewed/interpreted by me - no pna.  Code stemi activation, emergent cardiology consultation and cath lab activation.   Pts symptoms improved but not resolved.  Pt taken emergently to cath labs.   CRITICAL CARE RE: ACS/stemi, iv heparin,  emergent cath lab Performed by: Mirna Mires Total critical care time:  35 minutes Critical care time was exclusive of separately billable procedures and treating other patients. Critical care was necessary to treat or prevent imminent or life-threatening deterioration. Critical care was time spent personally by me on the following activities: development of treatment plan with patient and/or surrogate as well as nursing, discussions with consultants, evaluation of patient's response to treatment, examination of patient, obtaining history from patient or surrogate, ordering and performing treatments and interventions, ordering and review of laboratory studies, ordering and review of radiographic studies, pulse oximetry and re-evaluation of patient's condition.    Final Clinical Impression(s) / ED Diagnoses Final diagnoses:  None    Rx / DC Orders ED Discharge Orders    None       Lajean Saver, MD 10/23/20 1338

## 2020-10-23 NOTE — Progress Notes (Signed)
  Echocardiogram 2D Echocardiogram has been performed.  Johny Chess 10/23/2020, 2:01 PM

## 2020-10-23 NOTE — Progress Notes (Signed)
Patient complaining of abdominal gas, Dr. Curly Shores paged. RN received verbal order for 10 mg Reglan

## 2020-10-23 NOTE — H&P (Addendum)
Cardiology Admission History and Physical:   Patient ID: Daniel Cox MRN: [id number]; DOB: June 18, 1946   Admission date: 10/23/2020  PCP:  Mayra Neer, [place]  Cardiologist:  Sinclair Grooms, MD   Chief Complaint:  Chest pain   Patient Profile:   Daniel Cox is a 75 y.o. male with CAD s/p LAD stenting in 3532 with embolic CVA complicating coronary angiography, hypertension, hyperlipidemia, CKD III, sinus bradycardia and tobacco smoking presented as code STEMI.  History of ACS 02/06/2013.  Attempted angiography via right radial and noted to have intense radial artery spasm and torturous aorta.  During angiography patient had a code stroke.  Was evaluated by neurology.  No TPA given.  He underwent PCI with DES to LAD 4 days later.  History of Present Illness:   [name] was in usual state of health up until 5:30 AM when he woke up from sleep with substernal chest pressure radiating to his left shoulder.  It was 6 out of 10.  No associated diaphoresis, nausea or vomiting.  Pain persisted and he called EMS.  EKG showed lateral ST elevation with reciprocal ST depression anterior leads.  He was given aspirin 324 mg and code STEMI activated.  Upon arrival to ER his chest pain was improved to 2 out of 10.  He was given heparin bolus.  Shortly after that he was complaining of worsening pain to 4 out of 10 with radiation to his back.  He was given morphine 2 mg.  EKG with improved ST segment.  He was brought to Cath Lab.  Currently smokes less than half a pack a day cigarette.  He played 18 holes of golf yesterday.  Does not chest tightness or pressure.  Denies sick contact.  No fever, chills, cough, congestion, palpitation, dizziness, orthopnea, PND or syncope.   Past Medical History:  Diagnosis Date  . Chronic kidney disease   . Degenerative disc disease, cervical   . Degenerative lumbar disc   . Erectile dysfunction   . Hyperlipidemia      Past Surgical History:  Procedure Laterality Date  . Appendectony    . COLONOSCOPY WITH PROPOFOL N/A 08/16/2015   Procedure: COLONOSCOPY WITH PROPOFOL;  Surgeon: Garlan Fair, MD;  Location: [facility] ENDOSCOPY;  Service: Endoscopy;  Laterality: N/A;  . LEFT HEART CATHETERIZATION WITH CORONARY ANGIOGRAM N/A 02/06/2013   Procedure: LEFT HEART CATHETERIZATION WITH CORONARY ANGIOGRAM;  Surgeon: Sinclair Grooms, MD;  Location: [facility] CATH LAB;  Service: Cardiovascular;  Laterality: N/A;  . PERCUTANEOUS CORONARY STENT INTERVENTION (PCI-S) N/A 02/10/2013   Procedure: PERCUTANEOUS CORONARY STENT INTERVENTION (PCI-S);  Surgeon: Sinclair Grooms, MD;  Location: [facility] CATH LAB;  Service: Cardiovascular;  Laterality: N/A;  . Right shoulder       Medications Prior to Admission: Prior to Admission medications   Medication Sig Start Date End Date Taking? Authorizing Provider  acetaminophen (TYLENOL) 500 MG tablet Take 1,000 mg by mouth every 6 (six) hours as needed for mild pain, fever or headache.     [provider]  aspirin EC 81 MG tablet Take 1 tablet (81 mg total) by mouth daily. 04/17/17   Belva Crome, MD  atorvastatin (LIPITOR) 80 MG tablet Take 80 mg by mouth daily.    [provider]  cetirizine (ZYRTEC) 10 MG tablet Take 10 mg by mouth daily as needed for allergies.    [provider]  cholecalciferol (VITAMIN D) 1000 UNITS  tablet Take 1,000 Units by mouth every morning.    [provider]  lisinopril (PRINIVIL,ZESTRIL) 10 MG tablet Take 10 mg by mouth daily.    [provider]  metoprolol succinate (TOPROL-XL) 25 MG 24 hr tablet Take 25 mg by mouth daily. 05/16/19   [provider]  Multiple Vitamins-Minerals (MULTIVITAMIN WITH MINERALS) tablet Take 1 tablet by mouth every morning.    [provider]  nitroGLYCERIN (NITROSTAT) 0.4 MG SL tablet Place 1 tablet (0.4 mg total) under the tongue every 5 (five) minutes x 3 doses as needed for  chest pain. 02/11/13   Belva Crome, MD  NON FORMULARY CPAP Machine - Use as directed at bedtime.    [provider]  pregabalin (LYRICA) 75 MG capsule Take 150 mg by mouth 2 (two) times daily. 06/20/19 06/19/20  [provider]  sildenafil (VIAGRA) 100 MG tablet Take 100 mg by mouth as needed. 05/16/19   [provider]     Allergies:    Allergies  Allergen Reactions  . Penicillins Hives, Itching and Rash    Has patient had a PCN reaction causing immediate rash, facial/tongue/throat swelling, SOB or lightheadedness with hypotension: No Has patient had a PCN reaction causing severe rash involving mucus membranes or skin necrosis: No Has patient had a PCN reaction that required hospitalization No Has patient had a PCN reaction occurring within the last 10 years: No If all of the above answers are "NO", then may proceed with Cephalosporin use.     Social History:   Social History   Socioeconomic History  . Marital status: Married    Spouse name: Not on file  . Number of children: 3  . Years of education: Not on file  . Highest education level: Not on file  Occupational History  . Not on file  Tobacco Use  . Smoking status: Former Smoker    Packs/day: 0.50    Years: 30.00    Pack years: 15.00  . Smokeless tobacco: Never Used  . Tobacco comment: 2 weeks ago from 06-04-13  Substance and Sexual Activity  . Alcohol use: No  . Drug use: No  . Sexual activity: Yes  Other Topics Concern  . Not on file  Social History Narrative  . Not on file   Social Determinants of Health   Financial Resource Strain: Not on file  Food Insecurity: Not on file  Transportation Needs: Not on file  Physical Activity: Not on file  Stress: Not on file  Social Connections: Not on file  Intimate Partner Violence: Not on file    Family History:   The patient's family history includes Colon cancer in his sister; Diabetes in his sister; Heart attack in his brother,  brother, father, and mother; Heart disease in his sister.    ROS:  Please see the history of present illness. All other ROS reviewed and negative.     Physical Exam/Data:   Vitals:   10/23/20 0744 10/23/20 0745 10/23/20 0753 10/23/20 0816  BP:  (!) 148/94    Pulse:  (!) 44 (!) 50   Resp:  20 20   Temp:  (!) 97.4 F (36.3 C)    TempSrc:  Oral    SpO2:  97% 96% 97%  Weight: 78.9 kg     Height: 5\' 8"  (1.727 m)      No intake or output data in the 24 hours ending 10/23/20 0831 Last 3 Weights 10/23/2020 07/16/2019 05/24/2018  Weight (lbs) 174 lb  167 lb 167 lb  Weight (kg) 78.926 kg 75.751 kg 75.751 kg     Body mass index is 26.46 kg/m.  General:  Well nourished, well developed, in no acute distress HEENT: normal Lymph: no adenopathy Neck: no JVD Endocrine:  No thryomegaly Vascular: No carotid bruits; FA pulses 2+ bilaterally without bruits  Cardiac:  normal S1, S2; RRR; no murmur  Lungs:  clear to auscultation bilaterally, no wheezing, rhonchi or rales  Abd: soft, nontender, no hepatomegaly  Ext: no edema Musculoskeletal:  No deformities, BUE and BLE strength normal and equal Skin: warm and dry  Neuro:  CNs 2-12 intact, no focal abnormalities noted Psych:  Normal affect    EKG:  The ECG that was done today  was personally reviewed and demonstrates Laterall ST elevation with anterior depression  Relevant CV Studies:   Cath 02/06/2013 PROCEDURE:  Left heart catheterization with selective coronary angiography, left ventriculogram.   INDICATIONS:  Acute coronary syndrome   The risks, benefits, and details of the procedure were explained to the patient.  The patient verbalized understanding and wanted to proceed.  Informed written consent was obtained.   PROCEDURE TECHNIQUE:  After Xylocaine anesthesia a 5 French sheath was placed in the right femoral artery with a single anterior needle wall stick. We attempted right radial angiography but were unsuccessful due to intense  radial artery spasm after cannulating and attempting to pass the wire.  We then noticed that the aorta was tortuous and dilated as we rounded the aortic arch to perform coronary angiography. Multiple catheter exchanges were necessary because of aortic tortuosity. Coronary angiography was done using a 5 French MP A2, JR 4, and JL 5 catheter.  Left ventriculography was done using a 5 [place] JR 4 catheter. Hand injection was used.    Because of the aortic tortuosity and dilatation, we needed to use the guidewire to cross the aortic valve with a [place] right catheter. We were unable to obtain any coronary shots with multipurpose catheter. During left coronary angiography was began noticing that the patient tended to thrash around on the table and was unable to follow commands. Neurological assessment clearly demonstrated evidence of left brain injury with a left gaze preference and speech difficulty. We finish taking the pictures of the left coronary and initiated CODE STROKE protocol .   CONTRAST:  Total of 100 cc.   COMPLICATIONS: Embolic stroke involving left brain. The case was terminated and code stroke protocol initiated. Midway through the procedure after 3 catheter exchanges the patient began to move around on the table and became unable to follow commands. Assessment demonstrated speech impairment, left gaze preference, and inability to follow commands.   HEMODYNAMICS:  Aortic pressure was 114/72 mmHg; LV pressure was 124/21; LVEDP 21 mm mercury.  There was no gradient between the left ventricle and aorta.     ANGIOGRAPHIC DATA:  The aorta appeared to be tortuous and dilated and was demonstrated by cinefluoroscopy before attempting coronary angiography.    The left main coronary artery is large and widely patent.   The left anterior descending artery is moderate to heavily calcified proximally. In the proximal to mid vessel distal to the first septal perforator there is a segmental stenosis with  up to 99% obstruction with TIMI grade 2 flow. A small to moderate diagonal branch arises from the distal portion of this segmental LAD stenosis. The diagonal is diffusely diseased throughout the proximal third of the vessel with up to 90% stenosis.. This bifurcation lesion  is a [place] 1, 0, 1.   The left circumflex artery is patent. He gives origin to 3 obtuse marginal branches. The first 2 marginals are large. The first obtuse marginal contains proximal segmental 50% stenosis. The second obtuse marginal which is the largest of the 3 contains ostial 80% stenosis. The proximal portion of the third obtuse marginal is 99% obstructed..   The right coronary artery is tortuous with a shepherd's screw origin. Moderate least severe disease is noted throughout the mid vessel with 3 lesions, eccentric in nature in the 50-70% range. The distal vessel contains concentric 50% stenosis.   LEFT VENTRICULOGRAM:  Left ventricular angiogram was done in the 30 RAO projection and revealed mild apical wall motion abnormality and preserved systolic function with an estimated ejection fraction of 60 %.    IMPRESSIONS:  1. Acute left brain stroke complicating a complicated angiographic procedure   2. 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   3. Overall normal left ventricular function     RECOMMENDATION:  Surgical revascularization versus culprit PCI for management of coronary disease.   The major and most pressing issue at this time is the patient's neurological status. Code stroke is been initiated and the neurologist is here assessing the patient..    Cath 02/10/2013  PROCEDURE TECHNIQUE:  After Xylocaine anesthesia a 6 French sheath was placed in the right femoral artery with a single anterior needle wall stick.   Coronary guiding shots were  made using a 4 cm 6 [place] XP LAD catheter. The aortic morphology caused tenuous guide support because it appears to be elongated. Antithrombotic therapy, bivalirudin bolus and infusion, was begun and determined to be therapeutic by ACT. Antiplatelet therapy, Plavix 300 mg at 6 AM, was loaded. The patient had been receiving Plavix over the weekend prior to the procedure.   PCI was then performed with some difficulty due to significant angulation and tortuosity of the  proximal LAD from the left main. We were eventually able to get a [place] wire distal in the LAD. Predilatation was performed with a 3.0 x 15 mm long balloon. We then placed a BMW as a buddy wire into the ramus intermedius branch. This supplied does not support to allow [place] to position and deployed a 3.0 x 24 mm Promus Premier drug-eluting stent. We then post dilated the stent to 3.25 proximally and 3.5 distally using 15 mm long [place] balloons. Peak pressure of 14 atmospheres. TIMI grade 3 flow was noted post deployment. Multiple doses of intracoronary nitroglycerin were administered. No chest discomfort or clinical complications occurred during the procedure.   Angio-Seal was performed with good hemostasis.   CONTRAST:  Total of 100 cc.   COMPLICATIONS:  None.     ANGIOGRAPHIC RESULTS:    99% mid LAD stenosis before the margin of diagonal #1 reduced to 0% with TIMI grade 3 flow following stenting     IMPRESSIONS:  Successful DES implantation in the mid LAD with reduction in segmental 99% stenosis to 0% with TIMI grade 3 flow. The stent was post dilated to 3.5 mm in diameter  Laboratory Data:  High Sensitivity Troponin:  No results for input(s): TROPONINIHS in the last 720 hours.    Chemistry Recent Labs  Lab 10/23/20 0748  NA 141  K 3.9  CL 107  GLUCOSE 112*  BUN 14  CREATININE 1.50*  No results for input(s): PROT, ALBUMIN, AST, ALT, ALKPHOS, BILITOT in the last 168 hours. Hematology Recent Labs  Lab 10/23/20 0741  10/23/20 0748  WBC 5.6  --   RBC 3.90*  --   HGB 13.0 12.9*  HCT 40.7 38.0*  MCV 104.4*  --   MCH 33.3  --   MCHC 31.9  --   RDW 12.1  --   PLT 146*  --    BNPNo results for input(s): BNP, PROBNP in the last 168 hours.  DDimer No results for input(s): DDIMER in the last 168 hours.   Radiology/Studies:  DG Chest Port 1 View  Result Date: 10/23/2020 CLINICAL DATA:  75 year old male with history of ST-elevation myocardial infarction. EXAM: PORTABLE CHEST 1 VIEW COMPARISON:  Chest x-ray 02/04/2013. FINDINGS: Lung volumes are normal. No consolidative airspace disease. No pleural effusions. No pneumothorax. No pulmonary nodule or mass noted. Tortuosity of the descending thoracic aorta. Pulmonary vasculature and the cardiomediastinal silhouette are otherwise within normal limits. IMPRESSION: No radiographic evidence of acute cardiopulmonary disease. Electronically Signed   By: Vinnie Langton M.D.   On: 10/23/2020 08:04     Assessment and Plan:   1. STEMI He was given aspirin 324 mg, 4000 unit of heparin and morphine 2 mg.  3 out of 10 chest pain in Cath Lab.  Pending emergent cardiac catheterization  2.  CAD s/p DES to LAD -On aspirin statin and beta-blocker  3.  History of stroke during coronary angiography in 3546 -No complications since then  4.  CKD stage III -Unknown baseline -Creatinine 1.5 today  5.  Hyperlipidemia -Check lipid panel -Continue statin  6. Tobacco abuse - Recommended cessation   Risk Assessment/Risk Scores:   TIMI Risk Score for ST  Elevation MI:   The patient's TIMI risk score is 3, which indicates a 4.4% risk of all cause mortality at 30 days. {  Severity of Illness: The appropriate patient status for this patient is INPATIENT. Inpatient status is judged to be reasonable and necessary in order to provide the required intensity of service to ensure the patient's safety. The patient's presenting symptoms, physical exam findings, and initial  radiographic and laboratory data in the context of their chronic comorbidities is felt to place them at high risk for further clinical deterioration. Furthermore, it is not anticipated that the patient will be medically stable for discharge from the hospital within 2 midnights of admission. The following factors support the patient status of inpatient.   " The patient's presenting symptoms include Chest pain . " The worrisome physical exam findings include N/A " The initial radiographic and laboratory data are worrisome because of N/A " The chronic co-morbidities include CAD, CVA, HTN   * I certify that at the point of admission it is my clinical judgment that the patient will require inpatient hospital care spanning beyond 2 midnights from the point of admission due to high intensity of service, high risk for further deterioration and high frequency of surveillance required.*    For questions or updates, please contact [place] Please consult [email or web address] for contact info under     Signed, Leanor Kail, PA  10/23/2020 8:31 AM   Agree with note by Robbie Lis PA-C  Mr. Port is a 75 year old married black male patient of Dr. [name] with prior history of CAD status post PCI and drug-eluting stenting of the LAD 02/06/2013 via the femoral approach.  This is complicated by CNS event.  He did have disease in his RCA  and circumflex as well.  He has not seen Dr. [name] for several years.  He played 18 holes of golf yesterday without symptoms.  He was awakened at 530 this morning with chest pressure.  EMS was called.  He did have subtle lateral ST segment elevation.  He was brought to the emergency room and evaluated.  Because of ongoing chest pain he was elected to bring him urgently to the cardiac catheterization laboratory for angiography and potential intervention.  Lorretta Harp, M.D., [place], [facility], [place] [address]. [place], [place]  [id number]  [phone] 10/23/2020 10:02 AM

## 2020-10-24 ENCOUNTER — Encounter (HOSPITAL_COMMUNITY): Payer: Self-pay | Admitting: Cardiovascular Disease

## 2020-10-24 DIAGNOSIS — I249 Acute ischemic heart disease, unspecified: Secondary | ICD-10-CM

## 2020-10-24 LAB — CBC
HCT: 34.3 % — ABNORMAL LOW (ref 39.0–52.0)
Hemoglobin: 11.6 g/dL — ABNORMAL LOW (ref 13.0–17.0)
MCH: 33.2 pg (ref 26.0–34.0)
MCHC: 33.8 g/dL (ref 30.0–36.0)
MCV: 98.3 fL (ref 80.0–100.0)
Platelets: 169 10*3/uL (ref 150–400)
RBC: 3.49 MIL/uL — ABNORMAL LOW (ref 4.22–5.81)
RDW: 12.2 % (ref 11.5–15.5)
WBC: 6.8 10*3/uL (ref 4.0–10.5)
nRBC: 0 % (ref 0.0–0.2)

## 2020-10-24 LAB — BASIC METABOLIC PANEL
Anion gap: 9 (ref 5–15)
BUN: 11 mg/dL (ref 8–23)
CO2: 24 mmol/L (ref 22–32)
Calcium: 8.6 mg/dL — ABNORMAL LOW (ref 8.9–10.3)
Chloride: 103 mmol/L (ref 98–111)
Creatinine, Ser: 1.37 mg/dL — ABNORMAL HIGH (ref 0.61–1.24)
GFR, Estimated: 54 mL/min — ABNORMAL LOW (ref >60)
Glucose, Bld: 117 mg/dL — ABNORMAL HIGH (ref 70–99)
Potassium: 3.2 mmol/L — ABNORMAL LOW (ref 3.5–5.1)
Sodium: 136 mmol/L (ref 135–145)

## 2020-10-24 LAB — HEPARIN LEVEL (UNFRACTIONATED)
Heparin Unfractionated: 0.11 IU/mL — ABNORMAL LOW (ref 0.30–0.70)
Heparin Unfractionated: 0.15 IU/mL — ABNORMAL LOW (ref 0.30–0.70)

## 2020-10-24 MED ORDER — POTASSIUM CHLORIDE CRYS ER 20 MEQ PO TBCR
40.0000 meq | EXTENDED_RELEASE_TABLET | Freq: Once | ORAL | Status: AC
  Administered 2020-10-24: 40 meq via ORAL
  Filled 2020-10-24: qty 2

## 2020-10-24 MED ORDER — NITROGLYCERIN 0.4 MG SL SUBL: 0.4000 mg | SUBLINGUAL_TABLET | SUBLINGUAL | Status: DC | PRN

## 2020-10-24 MED ORDER — POTASSIUM CHLORIDE CRYS ER 20 MEQ PO TBCR
20.0000 meq | EXTENDED_RELEASE_TABLET | Freq: Once | ORAL | Status: AC
  Administered 2020-10-24: 20 meq via ORAL
  Filled 2020-10-24: qty 1

## 2020-10-24 NOTE — Progress Notes (Signed)
[place] for Heparin Indication: chest pain/ACS  Allergies  Allergen Reactions  . Penicillins Hives, Itching and Rash    Has patient had a PCN reaction causing immediate rash, facial/tongue/throat swelling, SOB or lightheadedness with hypotension: No Has patient had a PCN reaction causing severe rash involving mucus membranes or skin necrosis: No Has patient had a PCN reaction that required hospitalization No Has patient had a PCN reaction occurring within the last 10 years: No If all of the above answers are "NO", then may proceed with Cephalosporin use.     Patient Measurements: Height: 5\' 8"  (172.7 cm) Weight: 78.9 kg (174 lb) IBW/kg (Calculated) : 68.4   Vital Signs: Temp: 98.3 F (36.8 C) (02/27 0019) Temp Source: Oral (02/27 0019) BP: 82/63 (02/27 0230) Pulse Rate: 56 (02/27 0230)  Labs: Recent Labs    10/23/20 0741 10/23/20 0748 10/23/20 1042 10/23/20 1822 10/24/20 0241  HGB 13.0 12.9*  --  13.3 11.6*  HCT 40.7 38.0*  --  38.7* 34.3*  PLT 146*  --   --  197 169  APTT 20*  --   --   --   --   LABPROT 12.7  --   --   --   --   INR 1.0  --   --   --   --   HEPARINUNFRC  --   --   --  0.43 0.11*  CREATININE 1.51* 1.50*  --   --  1.37*  TROPONINIHS 4  --  120*  --   --     Estimated Creatinine Clearance: 46.5 mL/min (A) (by C-G formula based on SCr of 1.37 mg/dL (H)).   Medical History: Past Medical History:  Diagnosis Date  . Chronic kidney disease   . Degenerative disc disease, cervical   . Degenerative lumbar disc   . Erectile dysfunction   . Hyperlipidemia      Assessment:  74yom admitted for STEMI, no intervention secondary to severe tortuosity. Discussed with MD decided to run tirofiban for 6hr and heparin x48hr for ACS.   Tirofiban complete at 6pm  Heparin drip 600 uts/hr HL 0.48 at goal.  Will decrease slightly to ensure no accumulation overnight.   CBC stable post cath   2/27 AM update:  Heparin  level low Hgb 11.6 6 hours of Tirofiban completed earlier  Goal of Therapy:  Heparin level 0.3-0.7 units/ml Monitor platelets by anticoagulation protocol: Yes   Plan:  Inc heparin to 650 units/hr 1200 heparin level  Narda Bonds, PharmD, BCPS Clinical Pharmacist Phone: [phone]

## 2020-10-24 NOTE — Progress Notes (Signed)
[place] for Heparin Indication: chest pain/ACS  Allergies  Allergen Reactions  . Penicillins Hives, Itching and Rash    Has patient had a PCN reaction causing immediate rash, facial/tongue/throat swelling, SOB or lightheadedness with hypotension: No Has patient had a PCN reaction causing severe rash involving mucus membranes or skin necrosis: No Has patient had a PCN reaction that required hospitalization No Has patient had a PCN reaction occurring within the last 10 years: No If all of the above answers are "NO", then may proceed with Cephalosporin use.     Patient Measurements: Height: 5\' 8"  (172.7 cm) Weight: 73.9 kg (162 lb 14.7 oz) IBW/kg (Calculated) : 68.4   Vital Signs: Temp: 97.9 F (36.6 C) (02/27 1248) Temp Source: Oral (02/27 1248) BP: 132/81 (02/27 1248) Pulse Rate: 91 (02/27 1248)  Labs: Recent Labs    10/23/20 0741 10/23/20 0748 10/23/20 1042 10/23/20 1822 10/24/20 0241 10/24/20 1207  HGB 13.0 12.9*  --  13.3 11.6*  --   HCT 40.7 38.0*  --  38.7* 34.3*  --   PLT 146*  --   --  197 169  --   APTT 20*  --   --   --   --   --   LABPROT 12.7  --   --   --   --   --   INR 1.0  --   --   --   --   --   HEPARINUNFRC  --   --   --  0.43 0.11* 0.15*  CREATININE 1.51* 1.50*  --   --  1.37*  --   TROPONINIHS 4  --  120*  --   --   --     Estimated Creatinine Clearance: 46.5 mL/min (A) (by C-G formula based on SCr of 1.37 mg/dL (H)).   Medical History: Past Medical History:  Diagnosis Date  . Chronic kidney disease   . Degenerative disc disease, cervical   . Degenerative lumbar disc   . Erectile dysfunction   . Hyperlipidemia     Assessment: 74yom admitted for STEMI, no intervention secondary to severe tortuosity. Discussed with MD decided to run tirofiban for 6hr and heparin x48hr for ACS.   Heparin level this afternoon is subtherapeutic at 0.15. No issues with infusion or bleeding per RN. Hgb 11.6, plt wnl.  Plan is for heparin x48h (will end 2/28 0800).   Goal of Therapy:  Heparin level 0.3-0.7 units/ml Monitor platelets by anticoagulation protocol: Yes   Plan:  Increase IV heparin to 900 units/hr 8h heparin level Monitor CBC, s/s bleeding  Rebbeca Paul, PharmD PGY1 Pharmacy Resident 10/24/2020 1:57 PM  Please check [email or web address] for unit-specific pharmacy phone numbers.

## 2020-10-24 NOTE — Progress Notes (Signed)
Progress Note  Patient Name: Daniel Cox Date of Encounter: 10/24/2020  Primary Cardiologist: Daniel Grooms, MD   Subjective   Patient notes that he is doing great.  Since day prior notes no chest pain since LHC changes.  PCI attempted for OM2 but unable to wire the lesion.  Given Aggrostat.  Some flow present at end of case.  No chest pain or pressure.  No SOB/DOE and no PND/Orthopnea.  No leg pain.  Sitting up and watching golf.   Inpatient Medications    Scheduled Meds: . aspirin  81 mg Oral Daily  . atorvastatin  80 mg Oral Daily  . Chlorhexidine Gluconate Cloth  6 each Topical Daily  . lisinopril  10 mg Oral Daily  . metoprolol succinate  25 mg Oral Daily  . potassium chloride  20 mEq Oral Once  . pregabalin  150 mg Oral BID  . sodium chloride flush  3 mL Intravenous Q12H  . ticagrelor  90 mg Oral BID   Continuous Infusions: . sodium chloride 10 mL/hr at 10/23/20 2118  . heparin 650 Units/hr (10/24/20 0700)   PRN Meds: sodium chloride, acetaminophen, morphine injection, ondansetron (ZOFRAN) IV, sodium chloride, sodium chloride flush   Vital Signs    Vitals:   10/24/20 0510 10/24/20 0520 10/24/20 0600 10/24/20 0705  BP: 91/69  124/80 115/71  Pulse: (!) 58 (!) 56 63 61  Resp: (!) 4 14 (!) 29 15  Temp:      TempSrc:      SpO2: 100% 100% 100% 100%  Weight:   73.9 kg   Height:   5\' 8"  (1.727 m)     Intake/Output Summary (Last 24 hours) at 10/24/2020 0731 Last data filed at 10/24/2020 0700 Gross per 24 hour  Intake 1019.6 ml  Output 1200 ml  Net -180.4 ml   Filed Weights   10/23/20 0744 10/24/20 0600  Weight: 78.9 kg 73.9 kg    Telemetry    SR and sinus braydcardia- the asystole and VT are artifactual - Personally Reviewed  ECG    10/24/20 SR 61 TWI flattening, resolution of ST elevations - Personally Reviewed  Physical Exam   GEN: No acute distress.   Neck: No JVD Cardiac: RRR, no murmurs, rubs, or gallops.  Cath site no bruit or  hematoma Respiratory: Clear to auscultation bilaterally. GI: Soft, nontender, non-distended  MS: No edema; No deformity. Neuro:  Nonfocal  Psych: Normal affect   Labs    Chemistry Recent Labs  Lab 10/23/20 0741 10/23/20 0748 10/24/20 0241  NA 140 141 136  K 4.0 3.9 3.2*  CL 109 107 103  CO2 19*  --  24  GLUCOSE 115* 112* 117*  BUN 12 14 11   CREATININE 1.51* 1.50* 1.37*  CALCIUM 9.1  --  8.6*  PROT 6.4*  --   --   ALBUMIN 3.5  --   --   AST 32  --   --   ALT 19  --   --   ALKPHOS 94  --   --   BILITOT 0.7  --   --   GFRNONAA 48*  --  54*  ANIONGAP 12  --  9     Hematology Recent Labs  Lab 10/23/20 0741 10/23/20 0748 10/23/20 1822 10/24/20 0241  WBC 5.6  --  8.6 6.8  RBC 3.90*  --  3.94* 3.49*  HGB 13.0 12.9* 13.3 11.6*  HCT 40.7 38.0* 38.7* 34.3*  MCV 104.4*  --  98.2 98.3  MCH 33.3  --  33.8 33.2  MCHC 31.9  --  34.4 33.8  RDW 12.1  --  12.0 12.2  PLT 146*  --  197 169    Cardiac EnzymesNo results for input(s): TROPONINI in the last 168 hours. No results for input(s): TROPIPOC in the last 168 hours.   BNPNo results for input(s): BNP, PROBNP in the last 168 hours.   DDimer No results for input(s): DDIMER in the last 168 hours.   Radiology    CARDIAC CATHETERIZATION  Result Date: 10/23/2020  Mid RCA lesion is 75% stenosed.  Dist RCA lesion is 80% stenosed.  Previously placed Prox LAD to Mid LAD stent (unknown type) is widely patent.  1st Diag lesion is 90% stenosed.  Ost Cx to Prox Cx lesion is 75% stenosed.  1st Mrg lesion is 85% stenosed.  2nd Mrg lesion is 99% stenosed.  Daniel Cox is a 75 y.o. male  [id number] LOCATION:  FACILITY: [facility] PHYSICIAN: Daniel Cox, M.D. [birth year] DATE OF PROCEDURE:  10/23/2020 DATE OF DISCHARGE: CARDIAC CATHETERIZATION / Attempt at PCI OM2 History obtained from chart review Daniel Cox is a 75 year old married African-American male patient of Dr. Thompson Cox with a history of PCI and drug-eluting stenting of his LAD  back in 02/06/2013.  4 days prior he had attempt at PCI which was complicated by a CNS event.  He initially try to go radially but because of radial spasm and tortuosity he ultimately went through the femoral approach.  He has not been seen by Dr. Tamala Cox for several years. .Daniel Cox was in usual state of health up until 5:30 AM when he woke up from sleep with substernal chest pressure radiating to his left shoulder.  It was 6 out of 10.  No associated diaphoresis, nausea or vomiting.  Pain persisted and he called EMS.  EKG showed lateral ST elevation with reciprocal ST depression anterior leads.  He was given aspirin 324 mg and code STEMI activated.  Upon arrival to ER his chest pain was improved to 2 out of 10.  He was given heparin bolus.  Shortly after that he was complaining of worsening pain to 4 out of 10 with radiation to his back.  He was given morphine 2 mg.  EKG with improved ST segment.  He was brought to Cath Lab.  Currently smokes less than half a pack a day cigarette.  He played 18 holes of golf yesterday.  Does not chest tightness or pressure.  Denies sick contact.  No fever, chills, cough, congestion, palpitation, dizziness, orthopnea, PND or syncope. PROCEDURE DESCRIPTION: The patient was brought to the second floor [facility] cath lab in the postabsorptive state. He was not premedicated . His right groin was prepped and shaved in usual sterile fashion. Xylocaine 1% was used for local anesthesia. A 6 French sheath was inserted into the right common femoral  artery using standard Seldinger technique.  Ultrasound was used to guide access.  A digital image was captured and placed the patient's chart.  5 French right left second sinus and catheters on the 5 French pigtail catheter used for selective coronary angiography and obtain left heart pressures.  Isovue dye was used for the entirety of the case (170 cc total delivered to patient).  Retroaortic, ventricular and pullback pressures were  recorded.  LVEDP was measured at 25. It appeared that OM 2 was the "culprit vessel" with TIMI 0-I flow.  In addition, he had high-grade proximal AV  groove circumflex stenosis and ostial/proximal OM1 stenosis as well as first diagonal branch stenosis.  He also had distal RCA stenosis.  His EKG was consistent with inferolateral STEMI. The patient received an additional 6 units of heparin plus additional 3000 as of heparin (9000 units total) with an ACT of 267.  Isovue-East for the entirety of the intervention.  Retroaortic pressures monitored during the case. Using a 6 [place] XB 3.5 cm guide catheter along with a 0.14 [place] guidewire and a 0.14 cougar guidewire I was able to wire the circumflex but was unable to select the second obtuse marginal branch despite multiple attempts.  At the beginning of the case the OM had TIMI 0-I flow.  At the end of the case and had TIMI II-III flow with a high-grade somewhat hazy appearing ostial stenosis.  Because of the angulation and tortuosity of the vessel I do not think it is technically suitable for PCI.  The patient was pain-free at the end of the case.   Unsuccessful attempted PCI of high-grade ostial second obtuse marginal branch stenosis secondary to severe tortuosity.  All catheter exchanges were done over a long wire in the ascending arch given the fact that he had a CNS embolic event during his previous procedure 8 years ago.  A right common femoral angiogram was performed and a Mynx closure device successfully deployed.  The patient left lab in stable condition.  Heparin will be restarted for 48 hours 4 hours after sheath removal without a bolus.  His ACS event will be treated medically.  He left the lab in stable condition. Daniel Cox. MD, [facility] 10/23/2020 9:39 AM   DG Chest Port 1 View  Result Date: 10/23/2020 CLINICAL DATA:  75 year old male with history of ST-elevation myocardial infarction. EXAM: PORTABLE CHEST 1 VIEW COMPARISON:  Chest x-ray 02/04/2013.  FINDINGS: Lung volumes are normal. No consolidative airspace disease. No pleural effusions. No pneumothorax. No pulmonary nodule or mass noted. Tortuosity of the descending thoracic aorta. Pulmonary vasculature and the cardiomediastinal silhouette are otherwise within normal limits. IMPRESSION: No radiographic evidence of acute cardiopulmonary disease. Electronically Signed   By: Vinnie Langton M.D.   On: 10/23/2020 08:04   ECHOCARDIOGRAM COMPLETE  Result Date: 10/23/2020    ECHOCARDIOGRAM REPORT   Patient Name:   Daniel Cox Date of Exam: 10/23/2020 Medical Rec #:  [id number]        Height:       68.0 in Accession #:    [id number]       Weight:       174.0 lb Date of Birth:  [birth year]         BSA:          1.926 m Patient Age:    75 years         BP:           148/100 mmHg Patient Gender: M                HR:           54 bpm. Exam Location:  Inpatient Procedure: 2D Echo Indications:    cad of native vessel  History:        Patient has prior history of Echocardiogram examinations, most                 recent 02/07/2013. CAD; Risk Factors:Hypertension, Dyslipidemia                 and Current Smoker.  Sonographer:  Johny Chess Referring Phys: [place]  1. Left ventricular ejection fraction, by estimation, is 60 to 65%. The left ventricle has normal function. The left ventricle has no regional wall motion abnormalities. Left ventricular diastolic parameters were normal.  2. Right ventricular systolic function is normal. The right ventricular size is normal.  3. The mitral valve is normal in structure. Trivial mitral valve regurgitation. No evidence of mitral stenosis.  4. The aortic valve is tricuspid. Aortic valve regurgitation is not visualized. Mild aortic valve sclerosis is present, with no evidence of aortic valve stenosis.  5. Aortic dilatation noted. There is borderline dilatation of the aortic root, measuring 39 mm.  6. The inferior vena cava is normal in size with  greater than 50% respiratory variability, suggesting right atrial pressure of 3 mmHg. FINDINGS  Left Ventricle: Left ventricular ejection fraction, by estimation, is 60 to 65%. The left ventricle has normal function. The left ventricle has no regional wall motion abnormalities. The left ventricular internal cavity size was normal in size. There is  no left ventricular hypertrophy. Left ventricular diastolic parameters were normal. Right Ventricle: The right ventricular size is normal.Right ventricular systolic function is normal. Left Atrium: Left atrial size was normal in size. Right Atrium: Right atrial size was normal in size. Pericardium: There is no evidence of pericardial effusion. Mitral Valve: The mitral valve is normal in structure. Trivial mitral valve regurgitation. No evidence of mitral valve stenosis. Tricuspid Valve: The tricuspid valve is normal in structure. Tricuspid valve regurgitation is trivial. No evidence of tricuspid stenosis. Aortic Valve: The aortic valve is tricuspid. Aortic valve regurgitation is not visualized. Mild aortic valve sclerosis is present, with no evidence of aortic valve stenosis. Pulmonic Valve: The pulmonic valve was normal in structure. Pulmonic valve regurgitation is not visualized. No evidence of pulmonic stenosis. Aorta: Aortic dilatation noted. There is borderline dilatation of the aortic root, measuring 39 mm. Venous: The inferior vena cava is normal in size with greater than 50% respiratory variability, suggesting right atrial pressure of 3 mmHg. IAS/Shunts: No atrial level shunt detected by color flow Doppler.  LEFT VENTRICLE PLAX 2D LVIDd:         4.70 cm  Diastology LVIDs:         3.00 cm  LV e' medial:    9.68 cm/s LV PW:         1.00 cm  LV E/e' medial:  8.1 LV IVS:        1.00 cm  LV e' lateral:   10.30 cm/s LVOT diam:     2.30 cm  LV E/e' lateral: 7.6 LV SV:         101 LV SV Index:   52 LVOT Area:     4.15 cm  RIGHT VENTRICLE             IVC RV S prime:      13.50 cm/s  IVC diam: 1.50 cm TAPSE (M-mode): 2.1 cm LEFT ATRIUM             Index       RIGHT ATRIUM           Index LA diam:        3.10 cm 1.61 cm/m  RA Area:     13.40 cm LA Vol (A2C):   41.0 ml 21.28 ml/m RA Volume:   32.50 ml  16.87 ml/m LA Vol (A4C):   45.3 ml 23.52 ml/m LA Biplane Vol: 45.0 ml 23.36 ml/m  AORTIC VALVE LVOT Vmax:   102.00 cm/s LVOT Vmean:  63.300 cm/s LVOT VTI:    0.242 m  AORTA Ao Root diam: 3.80 cm Ao Asc diam:  3.90 cm MITRAL VALVE MV Area (PHT): 2.06 cm    SHUNTS MV Decel Time: 369 msec    Systemic VTI:  0.24 m MV E velocity: 78.00 cm/s  Systemic Diam: 2.30 cm MV A velocity: 64.70 cm/s MV E/A ratio:  1.21 Kirk Ruths MD Electronically signed by Kirk Ruths MD Signature Date/Time: 10/23/2020/2:15:45 PM    Final     Cardiac Studies   ST elevations have resolved, no WMAs  Patient Profile     75 y.o. male HTN, HLD, CKD Stage III, CAD s/p LAD Stenting and sinus bradycardia, Active Tobacco Use who presentes for STEMI s/p   Assessment & Plan   STEMI  Coronary Artery Disease; Obstructive HTN HLD Tobacco Abuse CKD Stage IIIb - asymptomatic - anatomy: Likely OM2 culprit - continue ASA 81 mg;Brilinta for at least one month with DAPT of some sort for at least one year - heparin for 48 hours planned - continue statin, goal LDL < 70 - continue BB - will order prn nitrates; low threshold to start Imdur 30 mg PO Daily if worsing - No CIN at this time, thought may occur 09/2820; continue ACEi for now - appropriate for outpatient cardiac rehab - discussed smoking cessation, who notes he was able to stop smoking after prior surgeries  - Reasonable for transfer to cardiac telemetry floor - Full Code - Labs Ordered - DVT proph- heparain (as above)  For questions or updates, please contact [facility] Please consult [email or web address] for contact info under Cardiology/STEMI.      Signed, Werner Lean, MD  10/24/2020, 7:31 AM

## 2020-10-25 ENCOUNTER — Encounter (HOSPITAL_COMMUNITY): Payer: Self-pay | Admitting: Cardiovascular Disease

## 2020-10-25 DIAGNOSIS — E785 Hyperlipidemia, unspecified: Secondary | ICD-10-CM

## 2020-10-25 DIAGNOSIS — I10 Essential (primary) hypertension: Secondary | ICD-10-CM

## 2020-10-25 LAB — POCT ACTIVATED CLOTTING TIME: Activated Clotting Time: 267 seconds

## 2020-10-25 LAB — BASIC METABOLIC PANEL
Anion gap: 8 (ref 5–15)
BUN: 14 mg/dL (ref 8–23)
CO2: 22 mmol/L (ref 22–32)
Calcium: 8.6 mg/dL — ABNORMAL LOW (ref 8.9–10.3)
Chloride: 105 mmol/L (ref 98–111)
Creatinine, Ser: 1.5 mg/dL — ABNORMAL HIGH (ref 0.61–1.24)
GFR, Estimated: 49 mL/min — ABNORMAL LOW (ref >60)
Glucose, Bld: 94 mg/dL (ref 70–99)
Potassium: 4.1 mmol/L (ref 3.5–5.1)
Sodium: 135 mmol/L (ref 135–145)

## 2020-10-25 LAB — CBC
HCT: 34.5 % — ABNORMAL LOW (ref 39.0–52.0)
Hemoglobin: 11.7 g/dL — ABNORMAL LOW (ref 13.0–17.0)
MCH: 33.5 pg (ref 26.0–34.0)
MCHC: 33.9 g/dL (ref 30.0–36.0)
MCV: 98.9 fL (ref 80.0–100.0)
Platelets: 157 10*3/uL (ref 150–400)
RBC: 3.49 MIL/uL — ABNORMAL LOW (ref 4.22–5.81)
RDW: 12 % (ref 11.5–15.5)
WBC: 5.7 10*3/uL (ref 4.0–10.5)
nRBC: 0 % (ref 0.0–0.2)

## 2020-10-25 MED ORDER — ISOSORBIDE MONONITRATE ER 30 MG PO TB24
15.0000 mg | ORAL_TABLET | Freq: Every day | ORAL | Status: DC
  Administered 2020-10-25 – 2020-10-26 (×2): 15 mg via ORAL
  Filled 2020-10-25 (×2): qty 1

## 2020-10-25 MED ORDER — ENSURE ENLIVE PO LIQD
237.0000 mL | Freq: Two times a day (BID) | ORAL | Status: DC
  Administered 2020-10-25 – 2020-10-26 (×3): 237 mL via ORAL

## 2020-10-25 MED ORDER — ADULT MULTIVITAMIN W/MINERALS CH
1.0000 | ORAL_TABLET | Freq: Every day | ORAL | Status: DC
  Administered 2020-10-25 – 2020-10-26 (×2): 1 via ORAL
  Filled 2020-10-25 (×2): qty 1

## 2020-10-25 MED FILL — Verapamil HCl IV Soln 2.5 MG/ML: INTRAVENOUS | Qty: 2 | Status: AC

## 2020-10-25 MED FILL — Nitroglycerin IV Soln 100 MCG/ML in D5W: INTRA_ARTERIAL | Qty: 10 | Status: AC

## 2020-10-25 NOTE — Progress Notes (Signed)
Initial Nutrition Assessment  DOCUMENTATION CODES:   Not applicable  INTERVENTION:   -Ensure Enlive po BID, each supplement provides 350 kcal and 20 grams of protein -MVI with minerals daily  NUTRITION DIAGNOSIS:   Inadequate oral intake related to decreased appetite as evidenced by per patient/family report.  GOAL:   Patient will meet greater than or equal to 90% of their needs  MONITOR:   PO intake,Supplement acceptance,Labs,Weight trends,Skin,I & O's  REASON FOR ASSESSMENT:   Malnutrition Screening Tool    ASSESSMENT:   Daniel Cox is a 75 y.o. male with CAD s/p LAD stenting in 9924 with embolic CVA complicating coronary angiography, hypertension, hyperlipidemia, CKD III, sinus bradycardia and tobacco smoking presented as code STEMI.  Pt admitted with STEMI.   Reviewed I/O's: -521 ml x 24 hours and -702 ml since admission  UOP: 825 ml x 24 hours  Spoke with pt, who was sitting in recliner chair at time of visit. He reports feeling better today and is anxious to go home. Pt shares that he has experienced a decreased appetite over the past few years and has been experiencing early satiety when eating. Pt shares he usual gets "one good meal" per day (meat, starch, and vegetable) and also "nibbles" throughout the day on [place] sausages, chips, and sandwiches. He estimates he consumed about 75% of breakfast. Noted meal completions variable; noted meal completion 20-95%.   Pt shares that his UBW is around 176#. He estimates he has lost about 5 pounds in the past month. Pt shares that he has been experiencing anxiety and depression over the past few years and suspects that this is contributing to his decreased appetite. He sees an MD to assist with this problem and states depression has been stable over the past year. He also reports decreased functionality in rt arm secondary to a distant shoulder surgery (noted difference is muscle tone between rt upper arm and lt upper  arm).   RD discussed importance of good meal and supplement intake to promote healing. He is amenable to supplements.   Labs reviewed.   NUTRITION - FOCUSED PHYSICAL EXAM:  Flowsheet Row Most Recent Value  Orbital Region No depletion  Upper Arm Region No depletion  Thoracic and Lumbar Region No depletion  Buccal Region No depletion  Temple Region No depletion  Clavicle Bone Region No depletion  Clavicle and Acromion Bone Region No depletion  Scapular Bone Region No depletion  Dorsal Hand No depletion  Patellar Region No depletion  Anterior Thigh Region No depletion  Posterior Calf Region No depletion  Edema (RD Assessment) None  Hair Reviewed  Eyes Reviewed  Mouth Reviewed  Skin Reviewed  Nails Reviewed       Diet Order:   Diet Order            Diet regular Room service appropriate? Yes; Fluid consistency: Thin  Diet effective now                 EDUCATION NEEDS:   Education needs have been addressed  Skin:  Skin Assessment: Reviewed RN Assessment  Last BM:  10/23/20  Height:   Ht Readings from Last 1 Encounters:  10/24/20 5\' 8"  (1.727 m)    Weight:   Wt Readings from Last 1 Encounters:  10/24/20 73.9 kg    Ideal Body Weight:  70 kg  BMI:  Body mass index is 24.77 kg/m.  Estimated Nutritional Needs:   Kcal:  1850-2050  Protein:  90-105 grams  Fluid:  >  1.8 L    Loistine Chance, RD, LDN, [place] Registered Dietitian II Certified Diabetes Care and Education Specialist Please refer to [facility] for RD and/or RD on-call/weekend/after hours pager

## 2020-10-25 NOTE — Progress Notes (Addendum)
The patient has been seen in conjunction with Harlan Stains, NP. All aspects of care have been considered and discussed. The patient has been personally interviewed, examined, and all clinical data has been reviewed.   Lesions in circumflex and obtuse marginal territory is difficult to treat percutaneously due to angulation and bifurcation anatomy which would lead to high risk of complication/failure.  He also has diffuse disease in RCA.  LAD was previously stented and remains widely patent.  Plan is for aggressive medical therapy with control of symptoms using meds.  Troponin levels have been low.  Still at risk to have recurrent ST elevation MI.  So far, no recurrent angina.  Has ambulated with cardiac rehab.  Management strategy: Agree with dual antiplatelet therapy (although will delay coronary bypass surgery if needed to allow washout of P2 Y 12), beta-blocker, long-acting nitrate, high intensity statin, phase 2 cardiac rehab.  Phase 1 cardiac rehab.  CKD III, monitor closely.  Recheck be met in a.m.    Progress Note  Patient Name: Daniel Cox Date of Encounter: 10/25/2020  [facility] Cardiologist: Daniel Grooms, MD   Subjective   Doing well this morning. Walked with CR without recurrent chest pain.   Inpatient Medications    Scheduled Meds: . aspirin  81 mg Oral Daily  . atorvastatin  80 mg Oral Daily  . Chlorhexidine Gluconate Cloth  6 each Topical Daily  . lisinopril  10 mg Oral Daily  . metoprolol succinate  25 mg Oral Daily  . pregabalin  150 mg Oral BID  . sodium chloride flush  3 mL Intravenous Q12H  . ticagrelor  90 mg Oral BID   Continuous Infusions: . sodium chloride 10 mL/hr at 10/23/20 2118   PRN Meds: sodium chloride, acetaminophen, morphine injection, nitroGLYCERIN, ondansetron (ZOFRAN) IV, sodium chloride, sodium chloride flush   Vital Signs    Vitals:   10/24/20 1600 10/24/20 2200 10/25/20 0726 10/25/20 0814  BP: 116/82 113/74  115/73 115/73  Pulse: 75 (!) 59 78 74  Resp: _0 Temp: 98.7 F (37.1 C) 97.8 F (36.6 C) 98.4 F (36.9 C)   TempSrc: Oral Oral Oral   SpO2: 100% 100% 100%   Weight:      Height:        Intake/Output Summary (Last 24 hours) at 10/25/2020 0925 Last data filed at 10/25/2020 0819 Gross per 24 hour  Intake 530.59 ml  Output 1325 ml  Net -794.41 ml   Last 3 Weights 10/24/2020 10/24/2020 10/23/2020  Weight (lbs) 162 lb 14.7 oz 163 lb 174 lb  Weight (kg) 73.9 kg 73.936 kg 78.926 kg      Telemetry    SR - Personally Reviewed  ECG    No new tracing this morning  Physical Exam   GEN: No acute distress.   Neck: No JVD Cardiac: RRR, no murmurs, rubs, or gallops.  Respiratory: Clear to auscultation bilaterally. GI: Soft, nontender, non-distended  MS: No edema; No deformity. Neuro:  Nonfocal  Psych: Normal affect   Labs    High Sensitivity Troponin:   Recent Labs  Lab 10/23/20 0741 10/23/20 1042  TROPONINIHS 4 120*      Chemistry Recent Labs  Lab 10/23/20 0741 10/23/20 0748 10/24/20 0241 10/25/20 0152  NA 140 141 136 135  K 4.0 3.9 3.2* 4.1  CL 109 107 103 105  CO2 19*  --  24 22  GLUCOSE 115* 112* 117* 94  BUN 12 14 11  14  CREATININE 1.51* 1.50* 1.37* 1.50*  CALCIUM 9.1  --  8.6* 8.6*  PROT 6.4*  --   --   --   ALBUMIN 3.5  --   --   --   AST 32  --   --   --   ALT 19  --   --   --   ALKPHOS 94  --   --   --   BILITOT 0.7  --   --   --   GFRNONAA 48*  --  54* 49*  ANIONGAP 12  --  9 8     Hematology Recent Labs  Lab 10/23/20 1822 10/24/20 0241 10/25/20 0152  WBC 8.6 6.8 5.7  RBC 3.94* 3.49* 3.49*  HGB 13.3 11.6* 11.7*  HCT 38.7* 34.3* 34.5*  MCV 98.2 98.3 98.9  MCH 33.8 33.2 33.5  MCHC 34.4 33.8 33.9  RDW 12.0 12.2 12.0  PLT 197 169 157    BNPNo results for input(s): BNP, PROBNP in the last 168 hours.   DDimer No results for input(s): DDIMER in the last 168 hours.   Radiology    ECHOCARDIOGRAM COMPLETE  Result Date:  10/23/2020    ECHOCARDIOGRAM REPORT   Patient Name:   Daniel Cox Date of Exam: 10/23/2020 Medical Rec #:  [id number]        Height:       68.0 in Accession #:    [id number]       Weight:       174.0 lb Date of Birth:  [birth year]         BSA:          1.926 m Patient Age:    75 years         BP:           148/100 mmHg Patient Gender: M                HR:           54 bpm. Exam Location:  Inpatient Procedure: 2D Echo Indications:    cad of native vessel  History:        Patient has prior history of Echocardiogram examinations, most                 recent 02/07/2013. CAD; Risk Factors:Hypertension, Dyslipidemia                 and Current Smoker.  Sonographer:    Johny Chess Referring Phys: [place]  1. Left ventricular ejection fraction, by estimation, is 60 to 65%. The left ventricle has normal function. The left ventricle has no regional wall motion abnormalities. Left ventricular diastolic parameters were normal.  2. Right ventricular systolic function is normal. The right ventricular size is normal.  3. The mitral valve is normal in structure. Trivial mitral valve regurgitation. No evidence of mitral stenosis.  4. The aortic valve is tricuspid. Aortic valve regurgitation is not visualized. Mild aortic valve sclerosis is present, with no evidence of aortic valve stenosis.  5. Aortic dilatation noted. There is borderline dilatation of the aortic root, measuring 39 mm.  6. The inferior vena cava is normal in size with greater than 50% respiratory variability, suggesting right atrial pressure of 3 mmHg. FINDINGS  Left Ventricle: Left ventricular ejection fraction, by estimation, is 60 to 65%. The left ventricle has normal function. The left ventricle has no regional wall motion abnormalities. The left ventricular internal cavity size was  normal in size. There is  no left ventricular hypertrophy. Left ventricular diastolic parameters were normal. Right Ventricle: The right ventricular  size is normal.Right ventricular systolic function is normal. Left Atrium: Left atrial size was normal in size. Right Atrium: Right atrial size was normal in size. Pericardium: There is no evidence of pericardial effusion. Mitral Valve: The mitral valve is normal in structure. Trivial mitral valve regurgitation. No evidence of mitral valve stenosis. Tricuspid Valve: The tricuspid valve is normal in structure. Tricuspid valve regurgitation is trivial. No evidence of tricuspid stenosis. Aortic Valve: The aortic valve is tricuspid. Aortic valve regurgitation is not visualized. Mild aortic valve sclerosis is present, with no evidence of aortic valve stenosis. Pulmonic Valve: The pulmonic valve was normal in structure. Pulmonic valve regurgitation is not visualized. No evidence of pulmonic stenosis. Aorta: Aortic dilatation noted. There is borderline dilatation of the aortic root, measuring 39 mm. Venous: The inferior vena cava is normal in size with greater than 50% respiratory variability, suggesting right atrial pressure of 3 mmHg. IAS/Shunts: No atrial level shunt detected by color flow Doppler.  LEFT VENTRICLE PLAX 2D LVIDd:         4.70 cm  Diastology LVIDs:         3.00 cm  LV e' medial:    9.68 cm/s LV PW:         1.00 cm  LV E/e' medial:  8.1 LV IVS:        1.00 cm  LV e' lateral:   10.30 cm/s LVOT diam:     2.30 cm  LV E/e' lateral: 7.6 LV SV:         101 LV SV Index:   52 LVOT Area:     4.15 cm  RIGHT VENTRICLE             IVC RV S prime:     13.50 cm/s  IVC diam: 1.50 cm TAPSE (M-mode): 2.1 cm LEFT ATRIUM             Index       RIGHT ATRIUM           Index LA diam:        3.10 cm 1.61 cm/m  RA Area:     13.40 cm LA Vol (A2C):   41.0 ml 21.28 ml/m RA Volume:   32.50 ml  16.87 ml/m LA Vol (A4C):   45.3 ml 23.52 ml/m LA Biplane Vol: 45.0 ml 23.36 ml/m  AORTIC VALVE LVOT Vmax:   102.00 cm/s LVOT Vmean:  63.300 cm/s LVOT VTI:    0.242 m  AORTA Ao Root diam: 3.80 cm Ao Asc diam:  3.90 cm MITRAL VALVE MV  Area (PHT): 2.06 cm    SHUNTS MV Decel Time: 369 msec    Systemic VTI:  0.24 m MV E velocity: 78.00 cm/s  Systemic Diam: 2.30 cm MV A velocity: 64.70 cm/s MV E/A ratio:  1.21 Kirk Ruths MD Electronically signed by Kirk Ruths MD Signature Date/Time: 10/23/2020/2:15:45 PM    Final     Cardiac Studies   Cath: 10/23/20   Mid RCA lesion is 75% stenosed.  Dist RCA lesion is 80% stenosed.  Previously placed Prox LAD to Mid LAD stent (unknown type) is widely patent.  1st Diag lesion is 90% stenosed.  Ost Cx to Prox Cx lesion is 75% stenosed.  1st Mrg lesion is 85% stenosed.  2nd Mrg lesion is 99% stenosed.  IMPRESSION: Unsuccessful attempted PCI of high-grade ostial second obtuse  marginal branch stenosis secondary to severe tortuosity.  All catheter exchanges were done over a long wire in the ascending arch given the fact that he had a CNS embolic event during his previous procedure 8 years ago.  A right common femoral angiogram was performed and a Mynx closure device successfully deployed.  The patient left lab in stable condition.  Heparin will be restarted for 48 hours 4 hours after sheath removal without a bolus.  His ACS event will be treated medically.  He left the lab in stable condition.  Quay Burow. MD, [facility]  Diagnostic Dominance: Right   Echo: 10/23/20  IMPRESSIONS    1. Left ventricular ejection fraction, by estimation, is 60 to 65%. The  left ventricle has normal function. The left ventricle has no regional  wall motion abnormalities. Left ventricular diastolic parameters were  normal.  2. Right ventricular systolic function is normal. The right ventricular  size is normal.  3. The mitral valve is normal in structure. Trivial mitral valve  regurgitation. No evidence of mitral stenosis.  4. The aortic valve is tricuspid. Aortic valve regurgitation is not  visualized. Mild aortic valve sclerosis is present, with no evidence of  aortic valve stenosis.   5. Aortic dilatation noted. There is borderline dilatation of the aortic  root, measuring 39 mm.  6. The inferior vena cava is normal in size with greater than 50%  respiratory variability, suggesting right atrial pressure of 3 mmHg.   Patient Profile     75 y.o. male with PMH of HTN, HLD, CKD Stage III, CAD s/p LAD Stenting and sinus bradycardia, active Tobacco use who presented with STEMI.   Assessment & Plan    1. STEMI: hsTn peaked at 120. Underwent cardiac cath with unsuccessful attempt at PCI of the 2nd OM due to tortuosity. He was treated with IV aggrastat and the heparin for 48hrs post cath. Placed on DAPT with ASA/Brilinta with recommendation for one year. Has ambulated with cardiac rehab without recurrent chest pain. Echo with normal EF -- on ASA, Brilinta, statin, BB, ACEi  2. HTN: stable with current therapy  3. HLD: LDL 77 -- on high dose statin  4. Tobacco use: cessation advised   5. CKD III: Cr 1.37>>1.50 -- baseline around 1.3-1.5  For questions or updates, please contact [place] Please consult [email or web address] for contact info under        Signed, Reino Bellis, NP  10/25/2020, 9:25 AM

## 2020-10-25 NOTE — Progress Notes (Signed)
CARDIAC REHAB PHASE I   PRE:  Rate/Rhythm: 73 SR  BP:  Supine:   Sitting: 112/74  Standing:    SaO2: 100 RA  MODE:  Ambulation: 298 ft   POST:  Rate/Rhythm: 73 SR  BP:  Supine:   Sitting: 122/79  Standing:    SaO2: 98 RA  Pt tolerated exercise well and amb 298 ft with standby assist and no assistive devise. Pt had no c/o SOB, CP, or pain on walk. Ed done with pt on heart healthy diet, MI booklet, NTG, aspirin, brilinta adherence, tobacco cessation, at home exercise and will refer to cardiac rehab at [facility]. Pt voiced understanding and was given opportunity to answer any questions Pt was left in the recliner with call bell in reach.  [phone] Kirby Funk ACSM-EP 10/25/2020 10:22 AM

## 2020-10-26 ENCOUNTER — Other Ambulatory Visit: Payer: Self-pay | Admitting: Cardiology

## 2020-10-26 DIAGNOSIS — Z79899 Other long term (current) drug therapy: Secondary | ICD-10-CM

## 2020-10-26 DIAGNOSIS — I214 Non-ST elevation (NSTEMI) myocardial infarction: Secondary | ICD-10-CM

## 2020-10-26 LAB — CBC
HCT: 33.5 % — ABNORMAL LOW (ref 39.0–52.0)
Hemoglobin: 11.4 g/dL — ABNORMAL LOW (ref 13.0–17.0)
MCH: 33.8 pg (ref 26.0–34.0)
MCHC: 34 g/dL (ref 30.0–36.0)
MCV: 99.4 fL (ref 80.0–100.0)
Platelets: 129 10*3/uL — ABNORMAL LOW (ref 150–400)
RBC: 3.37 MIL/uL — ABNORMAL LOW (ref 4.22–5.81)
RDW: 12.1 % (ref 11.5–15.5)
WBC: 5.4 10*3/uL (ref 4.0–10.5)
nRBC: 0 % (ref 0.0–0.2)

## 2020-10-26 LAB — BASIC METABOLIC PANEL
Anion gap: 10 (ref 5–15)
BUN: 20 mg/dL (ref 8–23)
CO2: 21 mmol/L — ABNORMAL LOW (ref 22–32)
Calcium: 8.7 mg/dL — ABNORMAL LOW (ref 8.9–10.3)
Chloride: 104 mmol/L (ref 98–111)
Creatinine, Ser: 1.7 mg/dL — ABNORMAL HIGH (ref 0.61–1.24)
GFR, Estimated: 42 mL/min — ABNORMAL LOW (ref >60)
Glucose, Bld: 91 mg/dL (ref 70–99)
Potassium: 5.2 mmol/L — ABNORMAL HIGH (ref 3.5–5.1)
Sodium: 135 mmol/L (ref 135–145)

## 2020-10-26 MED ORDER — ENOXAPARIN SODIUM 40 MG/0.4ML ~~LOC~~ SOLN: 40.0000 mg | SUBCUTANEOUS | Status: DC

## 2020-10-26 MED ORDER — ISOSORBIDE MONONITRATE ER 30 MG PO TB24: 15.0000 mg | ORAL_TABLET | Freq: Every day | ORAL | 0 refills | Status: DC

## 2020-10-26 MED ORDER — SODIUM CHLORIDE 0.9 % IV SOLN: INTRAVENOUS | Status: AC

## 2020-10-26 MED ORDER — TICAGRELOR 90 MG PO TABS: 90.0000 mg | ORAL_TABLET | Freq: Two times a day (BID) | ORAL | 2 refills | Status: DC

## 2020-10-26 MED ORDER — NITROGLYCERIN 0.4 MG SL SUBL: 0.4000 mg | SUBLINGUAL_TABLET | SUBLINGUAL | 2 refills | Status: DC | PRN

## 2020-10-26 MED FILL — ISOSORBIDE MN ER 30 MG TAB: 30 | 60 days supply | Qty: 30 | Fill #0

## 2020-10-26 MED FILL — BRILINTA 90 MG TABLET: 90 | 30 days supply | Qty: 60 | Fill #0

## 2020-10-26 MED FILL — NITROGLYCERIN 0.4 MG TAB SL: 0.4 | 25 days supply | Qty: 25 | Fill #0

## 2020-10-26 NOTE — Progress Notes (Addendum)
The patient has been seen in conjunction with Harlan Stains, NP. All aspects of care have been considered and discussed. The patient has been personally interviewed, examined, and all clinical data has been reviewed.   Had dyspnea last night that awakened him from sleep and resolved with oxygen.  He had a similar feeling of dyspnea when he had chest pain during his acute presentation.  Episode last night was very brief.  Low blood pressure yesterday after starting additional medication including isosorbide.  Lisinopril has been discontinued.  Creatinine has bumped to 1.7.  1.51 mg/dL.  IV hydration of 500 cc has been started.  EDP 24 during coronary angiography procedure this weekend.  Ambulate after lunch.  Identified spontaneous dyspnea as being an anginal equivalent for which I have asked him to use sublingual nitroglycerin if it occurs.  If ambulation goes well and after IV hydration, he can be discharged today.   Progress Note  Patient Name: Daniel Cox Date of Encounter: 10/26/2020  [facility] Cardiologist: Belva Crome III, MD   Subjective   No complaints, walked with CR without chest pain this morning.  Inpatient Medications    Scheduled Meds: . aspirin  81 mg Oral Daily  . atorvastatin  80 mg Oral Daily  . Chlorhexidine Gluconate Cloth  6 each Topical Daily  . feeding supplement  237 mL Oral BID BM  . isosorbide mononitrate  15 mg Oral Daily  . metoprolol succinate  25 mg Oral Daily  . multivitamin with minerals  1 tablet Oral Daily  . pregabalin  150 mg Oral BID  . sodium chloride flush  3 mL Intravenous Q12H  . ticagrelor  90 mg Oral BID   Continuous Infusions: . sodium chloride 10 mL/hr at 10/23/20 2118   PRN Meds: sodium chloride, acetaminophen, morphine injection, nitroGLYCERIN, ondansetron (ZOFRAN) IV, sodium chloride, sodium chloride flush   Vital Signs    Vitals:   10/25/20 0814 10/25/20 1535 10/25/20 2050 10/26/20 0751  BP: 115/73 (!)  83/66 95/64 92/61   Pulse: 74 69 70 66  Resp:  16 18 20   Temp:  97.9 F (36.6 C) 97.9 F (36.6 C) (!) 96.9 F (36.1 C)  TempSrc:  Oral Oral Axillary  SpO2:  98% 97%   Weight:      Height:       No intake or output data in the 24 hours ending 10/26/20 0944 Last 3 Weights 10/24/2020 10/24/2020 10/23/2020  Weight (lbs) 162 lb 14.7 oz 163 lb 174 lb  Weight (kg) 73.9 kg 73.936 kg 78.926 kg      Telemetry    SR 60s - Personally Reviewed  ECG    No new tracing this morning  Physical Exam   GEN: No acute distress.   Neck: No JVD Cardiac: RRR, no murmurs, rubs, or gallops.  Respiratory: Clear to auscultation bilaterally. GI: Soft, nontender, non-distended  MS: No edema; No deformity. Neuro:  Nonfocal  Psych: Normal affect   Labs    High Sensitivity Troponin:   Recent Labs  Lab 10/23/20 0741 10/23/20 1042  TROPONINIHS 4 120*      Chemistry Recent Labs  Lab 10/23/20 0741 10/23/20 0748 10/24/20 0241 10/25/20 0152 10/26/20 0233  NA 140   < > 136 135 135  K 4.0   < > 3.2* 4.1 5.2*  CL 109   < > 103 105 104  CO2 19*  --  24 22 21*  GLUCOSE 115*   < > 117* 94 91  BUN 12   < > 11 14 20   CREATININE 1.51*   < > 1.37* 1.50* 1.70*  CALCIUM 9.1  --  8.6* 8.6* 8.7*  PROT 6.4*  --   --   --   --   ALBUMIN 3.5  --   --   --   --   AST 32  --   --   --   --   ALT 19  --   --   --   --   ALKPHOS 94  --   --   --   --   BILITOT 0.7  --   --   --   --   GFRNONAA 48*  --  54* 49* 42*  ANIONGAP 12  --  9 8 10    < > = values in this interval not displayed.     Hematology Recent Labs  Lab 10/24/20 0241 10/25/20 0152 10/26/20 0233  WBC 6.8 5.7 5.4  RBC 3.49* 3.49* 3.37*  HGB 11.6* 11.7* 11.4*  HCT 34.3* 34.5* 33.5*  MCV 98.3 98.9 99.4  MCH 33.2 33.5 33.8  MCHC 33.8 33.9 34.0  RDW 12.2 12.0 12.1  PLT 169 157 129*    BNPNo results for input(s): BNP, PROBNP in the last 168 hours.   DDimer No results for input(s): DDIMER in the last 168 hours.   Radiology    No  results found.  Cardiac Studies   Cath: 10/23/20   Mid RCA lesion is 75% stenosed.  Dist RCA lesion is 80% stenosed.  Previously placed Prox LAD to Mid LAD stent (unknown type) is widely patent.  1st Diag lesion is 90% stenosed.  Ost Cx to Prox Cx lesion is 75% stenosed.  1st Mrg lesion is 85% stenosed.  2nd Mrg lesion is 99% stenosed.  IMPRESSION:Unsuccessful attempted PCI of high-grade ostial second obtuse marginal branch stenosis secondary to severe tortuosity. All catheter exchanges were done over a long wire in the ascending arch given the fact that he had a CNS embolic event during his previous procedure 8 years ago. A right common femoral angiogram was performed and a Mynx closure device successfully deployed. The patient left lab in stable condition. Heparin will be restarted for 48 hours 4 hours after sheath removal without a bolus. His ACS event will be treated medically. He left the lab in stable condition.  Quay Burow. MD, [facility]  Diagnostic Dominance: Right   Echo: 10/23/20  IMPRESSIONS    1. Left ventricular ejection fraction, by estimation, is 60 to 65%. The  left ventricle has normal function. The left ventricle has no regional  wall motion abnormalities. Left ventricular diastolic parameters were  normal.  2. Right ventricular systolic function is normal. The right ventricular  size is normal.  3. The mitral valve is normal in structure. Trivial mitral valve  regurgitation. No evidence of mitral stenosis.  4. The aortic valve is tricuspid. Aortic valve regurgitation is not  visualized. Mild aortic valve sclerosis is present, with no evidence of  aortic valve stenosis.  5. Aortic dilatation noted. There is borderline dilatation of the aortic  root, measuring 39 mm.  6. The inferior vena cava is normal in size with greater than 50%  respiratory variability, suggesting right atrial pressure of 3 mmHg.   Patient Profile     75 y.o.  male with PMH of HTN, HLD, CKD Stage III, CAD s/p LAD Stenting and sinus bradycardia, active Tobacco use who presented with STEMI.   Assessment &  Plan    1. STEMI: hsTn peaked at 120. Underwent cardiac cath with unsuccessful attempt at PCI of the 2nd OM due to tortuosity. He was treated with IV aggrastat and the heparin for 48hrs post cath. Placed on DAPT with ASA/Brilinta with recommendation for one year. Echo with normal EF. He has been able to ambulate with cardiac rehab without recurrent chest pain -- on ASA, Brilinta, statin, BB, ACEi and Imdur added yesterday  2. HTN: blood pressures were soft overnight but improved this morning.   3. HLD: LDL 77 -- on high dose statin  4. Tobacco use: cessation advised   5. CKD III: Cr 1.37>>1.50>>1.7 this morning -- baseline around 1.3-1.5 -- will give IVFs 75cc/hr for 8 hrs. Discuss with MD regarding recheck of labs later today vs morning for discharge   For questions or updates, please contact [place] Please consult [email or web address] for contact info under        Signed, Reino Bellis, NP  10/26/2020, 9:44 AM

## 2020-10-26 NOTE — Discharge Summary (Addendum)
The patient has been seen in conjunction with Harlan Stains, NP. All aspects of care have been considered and discussed. The patient has been personally interviewed, examined, and all clinical data has been reviewed.   The patient has completed ambulation without dyspnea or chest discomfort.  Systolic blood pressures are in the 100/65 mmHg range without symptoms of lightheadedness or dizziness.  New therapies at discharge include low-dose beta-blocker, long-acting nitrate, and Brilinta.  Plan 7 to 10-day TOC with basic metabolic panel   Discharge Summary    Patient ID: Daniel Cox MRN: [id number]; DOB: [birth year]  Admit date: 10/23/2020 Discharge date: 10/26/2020  PCP:  Mayra Neer, MD   [place]  Cardiologist:  Sinclair Grooms, MD  Advanced Practice Provider:  No care team member to display Electrophysiologist:  None    Discharge Diagnoses    Active Problems:   Acute coronary syndrome Daniel Cox)   Hyperlipidemia with target LDL less than 70   Acute ST elevation myocardial infarction (STEMI) due to occlusion of circumflex coronary artery [facility])   NSTEMI (non-ST elevated myocardial infarction) [facility])    Diagnostic Studies/Procedures    Cath: 10/23/20   Mid RCA lesion is 75% stenosed.  Dist RCA lesion is 80% stenosed.  Previously placed Prox LAD to Mid LAD stent (unknown type) is widely patent.  1st Diag lesion is 90% stenosed.  Ost Cx to Prox Cx lesion is 75% stenosed.  1st Mrg lesion is 85% stenosed.  2nd Mrg lesion is 99% stenosed.  IMPRESSION:Unsuccessful attempted PCI of high-grade ostial second obtuse marginal branch stenosis secondary to severe tortuosity. All catheter exchanges were done over a long wire in the ascending arch given the fact that he had a CNS embolic event during his previous procedure 8 years ago. A right common femoral angiogram was performed and a Mynx closure device successfully deployed. The  patient left lab in stable condition. Heparin will be restarted for 48 hours 4 hours after sheath removal without a bolus. His ACS event will be treated medically. He left the lab in stable condition.  Quay Burow. MD, [facility]  Diagnostic Dominance: Right   Echo: 10/23/20  IMPRESSIONS    1. Left ventricular ejection fraction, by estimation, is 60 to 65%. The  left ventricle has normal function. The left ventricle has no regional  wall motion abnormalities. Left ventricular diastolic parameters were  normal.  2. Right ventricular systolic function is normal. The right ventricular  size is normal.  3. The mitral valve is normal in structure. Trivial mitral valve  regurgitation. No evidence of mitral stenosis.  4. The aortic valve is tricuspid. Aortic valve regurgitation is not  visualized. Mild aortic valve sclerosis is present, with no evidence of  aortic valve stenosis.  5. Aortic dilatation noted. There is borderline dilatation of the aortic  root, measuring 39 mm.  6. The inferior vena cava is normal in size with greater than 50%  respiratory variability, suggesting right atrial pressure of 3 mmHg.  _____________   History of Present Illness     Daniel Cox is a 75 y.o. male with  with CAD s/p LAD stenting in 3299 with embolic CVA complicating coronary angiography, hypertension, hyperlipidemia, CKD III, sinus bradycardia and tobacco smoking presented as code STEMI.  History of ACS 02/06/2013.  Attempted angiography via right radial and noted to have intense radial artery spasm and torturous aorta.  During angiography patient had a code stroke.  Was evaluated by neurology.  No  TPA given.  He underwent PCI with DES to LAD 4 days later.   Mr. Diem was in usual state of health up until 5:30 AM the morning of admission when he woke up from sleep with substernal chest pressure radiating to his left shoulder.  It was 6 out of 10.  No associated diaphoresis, nausea  or vomiting.  Pain persisted and he called EMS.  EKG showed lateral ST elevation with reciprocal ST depression anterior leads.  He was given aspirin 324 mg and code STEMI activated. Upon arrival to ER his chest pain was improved to 2 out of 10.  He was given heparin bolus.  Shortly after that he was complaining of worsening pain to 4 out of 10 with radiation to his back.  He was given morphine 2 mg.  EKG with improved ST segment.  He was brought to Cath Lab.  Currently smokes less than half a pack a day cigarette.  He played 18 holes of golf the day of admission.  Hospital Course     1. STEMI:hsTn peaked at 120. Underwent cardiac cath with unsuccessful attempt at PCI of the 2nd OM due to tortuosity. He was treated with IV aggrastat and the heparin for 48hrs post cath. Placed on DAPT with ASA/Brilinta with recommendation for one year. Echo with normal EF. He has been able to ambulate with cardiac rehab without recurrent chest pain -- on ASA, Brilinta, statin, BB, ACEi and Imdur 15mg  daily  2. NTZ:GYFVC pressures were soft with the addition of Imdur but improved prior to admission -- continue metoprolol 25mg  daily, along with Imdur 15mg  daily  3. HLD:LDL 77 -- on high dose atorvastatin 80mg  daily   4. Tobacco BSW:HQPRFFMBW advised   5. CKD III:Cr 1.37>>1.50>>1.7. Was given IV hydration prior to discharge -- baseline around 1.3-1.5 -- lisinopril was stopped prior to discharge   Did the patient have an acute coronary syndrome (MI, NSTEMI, STEMI, etc) this admission?:  Yes                               AHA/ACC Clinical Performance & Quality Measures: 5. Aspirin prescribed? - Yes 6. ADP Receptor Inhibitor (Plavix/Clopidogrel, Brilinta/Ticagrelor or Effient/Prasugrel) prescribed (includes medically managed patients)? - Yes 7. Beta Blocker prescribed? - Yes 8. High Intensity Statin (Lipitor 40-80mg  or Crestor 20-40mg ) prescribed? - Yes 9. EF assessed during THIS hospitalization? -  Yes 10. For EF <40%, was ACEI/ARB prescribed? - Not Applicable (EF >/= 46%) 11. For EF <40%, Aldosterone Antagonist (Spironolactone or Eplerenone) prescribed? - Not Applicable (EF >/= 65%) 12. Cardiac Rehab Phase II ordered (including medically managed patients)? - Yes       _____________  Discharge Vitals Blood pressure 96/72, pulse 70, temperature 98.2 F (36.8 C), temperature source Oral, resp. rate 18, height 5\' 8"  (1.727 m), weight 73.9 kg, SpO2 97 %.  Filed Weights   10/23/20 0744 10/24/20 0600 10/24/20 1248  Weight: 78.9 kg 73.9 kg 73.9 kg    Labs & Radiologic Studies    CBC Recent Labs    10/25/20 0152 10/26/20 0233  WBC 5.7 5.4  HGB 11.7* 11.4*  HCT 34.5* 33.5*  MCV 98.9 99.4  PLT 157 993*   Basic Metabolic Panel Recent Labs    10/25/20 0152 10/26/20 0233  NA 135 135  K 4.1 5.2*  CL 105 104  CO2 22 21*  GLUCOSE 94 91  BUN 14 20  CREATININE 1.50* 1.70*  CALCIUM  8.6* 8.7*   Liver Function Tests No results for input(s): AST, ALT, ALKPHOS, BILITOT, PROT, ALBUMIN in the last 72 hours. No results for input(s): LIPASE, AMYLASE in the last 72 hours. High Sensitivity Troponin:   Recent Labs  Lab 10/23/20 0741 10/23/20 1042  TROPONINIHS 4 120*    BNP Invalid input(s): POCBNP D-Dimer No results for input(s): DDIMER in the last 72 hours. Hemoglobin A1C No results for input(s): HGBA1C in the last 72 hours. Fasting Lipid Panel No results for input(s): CHOL, HDL, LDLCALC, TRIG, CHOLHDL, LDLDIRECT in the last 72 hours. Thyroid Function Tests No results for input(s): TSH, T4TOTAL, T3FREE, THYROIDAB in the last 72 hours.  Invalid input(s): FREET3 _____________  CARDIAC CATHETERIZATION  Result Date: 10/23/2020  Mid RCA lesion is 75% stenosed.  Dist RCA lesion is 80% stenosed.  Previously placed Prox LAD to Mid LAD stent (unknown type) is widely patent.  1st Diag lesion is 90% stenosed.  Ost Cx to Prox Cx lesion is 75% stenosed.  1st Mrg lesion is 85%  stenosed.  2nd Mrg lesion is 99% stenosed.  Daniel Cox is a 75 y.o. male  [id number] LOCATION:  FACILITY: [facility] PHYSICIAN: Quay Burow, M.D. 1946/02/03 DATE OF PROCEDURE:  10/23/2020 DATE OF DISCHARGE: CARDIAC CATHETERIZATION / Attempt at PCI OM2 History obtained from chart review Mr. Marin Roberts is a 75 year old married African-American male patient of Dr. Thompson Caul with a history of PCI and drug-eluting stenting of his LAD back in 02/06/2013.  4 days prior he had attempt at PCI which was complicated by a CNS event.  He initially try to go radially but because of radial spasm and tortuosity he ultimately went through the femoral approach.  He has not been seen by Dr. [name] for several years. .Mr. Totherow was in usual state of health up until 5:30 AM when he woke up from sleep with substernal chest pressure radiating to his left shoulder.  It was 6 out of 10.  No associated diaphoresis, nausea or vomiting.  Pain persisted and he called EMS.  EKG showed lateral ST elevation with reciprocal ST depression anterior leads.  He was given aspirin 324 mg and code STEMI activated.  Upon arrival to ER his chest pain was improved to 2 out of 10.  He was given heparin bolus.  Shortly after that he was complaining of worsening pain to 4 out of 10 with radiation to his back.  He was given morphine 2 mg.  EKG with improved ST segment.  He was brought to Cath Lab.  Currently smokes less than half a pack a day cigarette.  He played 18 holes of golf yesterday.  Does not chest tightness or pressure.  Denies sick contact.  No fever, chills, cough, congestion, palpitation, dizziness, orthopnea, PND or syncope. PROCEDURE DESCRIPTION: The patient was brought to the second floor [facility] cath lab in the postabsorptive state. He was not premedicated . His right groin was prepped and shaved in usual sterile fashion. Xylocaine 1% was used for local anesthesia. A 6 French sheath was inserted into the right common femoral  artery using  standard Seldinger technique.  Ultrasound was used to guide access.  A digital image was captured and placed the patient's chart.  5 French right left second sinus and catheters on the 5 French pigtail catheter used for selective coronary angiography and obtain left heart pressures.  Isovue dye was used for the entirety of the case (170 cc total delivered to patient).  Retroaortic, ventricular and pullback  pressures were recorded.  LVEDP was measured at 25. It appeared that OM 2 was the "culprit vessel" with TIMI 0-I flow.  In addition, he had high-grade proximal AV groove circumflex stenosis and ostial/proximal OM1 stenosis as well as first diagonal branch stenosis.  He also had distal RCA stenosis.  His EKG was consistent with inferolateral STEMI. The patient received an additional 6 units of heparin plus additional 3000 as of heparin (9000 units total) with an ACT of 267.  Isovue-East for the entirety of the intervention.  Retroaortic pressures monitored during the case. Using a 6 [place] XB 3.5 cm guide catheter along with a 0.14 [place] guidewire and a 0.14 cougar guidewire I was able to wire the circumflex but was unable to select the second obtuse marginal branch despite multiple attempts.  At the beginning of the case the OM had TIMI 0-I flow.  At the end of the case and had TIMI II-III flow with a high-grade somewhat hazy appearing ostial stenosis.  Because of the angulation and tortuosity of the vessel I do not think it is technically suitable for PCI.  The patient was pain-free at the end of the case.   Unsuccessful attempted PCI of high-grade ostial second obtuse marginal branch stenosis secondary to severe tortuosity.  All catheter exchanges were done over a long wire in the ascending arch given the fact that he had a CNS embolic event during his previous procedure 8 years ago.  A right common femoral angiogram was performed and a Mynx closure device successfully deployed.  The patient left lab in  stable condition.  Heparin will be restarted for 48 hours 4 hours after sheath removal without a bolus.  His ACS event will be treated medically.  He left the lab in stable condition. Quay Burow. MD, [facility] 10/23/2020 9:39 AM   DG Chest Port 1 View  Result Date: 10/23/2020 CLINICAL DATA:  75 year old male with history of ST-elevation myocardial infarction. EXAM: PORTABLE CHEST 1 VIEW COMPARISON:  Chest x-ray 02/04/2013. FINDINGS: Lung volumes are normal. No consolidative airspace disease. No pleural effusions. No pneumothorax. No pulmonary nodule or mass noted. Tortuosity of the descending thoracic aorta. Pulmonary vasculature and the cardiomediastinal silhouette are otherwise within normal limits. IMPRESSION: No radiographic evidence of acute cardiopulmonary disease. Electronically Signed   By: Vinnie Langton M.D.   On: 10/23/2020 08:04   ECHOCARDIOGRAM COMPLETE  Result Date: 10/23/2020    ECHOCARDIOGRAM REPORT   Patient Name:   Daniel Cox Date of Exam: 10/23/2020 Medical Rec #:  [id number]        Height:       68.0 in Accession #:    [id number]       Weight:       174.0 lb Date of Birth:  Feb 13, 1946         BSA:          1.926 m Patient Age:    75 years         BP:           148/100 mmHg Patient Gender: M                HR:           54 bpm. Exam Location:  Inpatient Procedure: 2D Echo Indications:    cad of native vessel  History:        Patient has prior history of Echocardiogram examinations, most  recent 02/07/2013. CAD; Risk Factors:Hypertension, Dyslipidemia                 and Current Smoker.  Sonographer:    Johny Chess Referring Phys: [place]  1. Left ventricular ejection fraction, by estimation, is 60 to 65%. The left ventricle has normal function. The left ventricle has no regional wall motion abnormalities. Left ventricular diastolic parameters were normal.  2. Right ventricular systolic function is normal. The right ventricular size is  normal.  3. The mitral valve is normal in structure. Trivial mitral valve regurgitation. No evidence of mitral stenosis.  4. The aortic valve is tricuspid. Aortic valve regurgitation is not visualized. Mild aortic valve sclerosis is present, with no evidence of aortic valve stenosis.  5. Aortic dilatation noted. There is borderline dilatation of the aortic root, measuring 39 mm.  6. The inferior vena cava is normal in size with greater than 50% respiratory variability, suggesting right atrial pressure of 3 mmHg. FINDINGS  Left Ventricle: Left ventricular ejection fraction, by estimation, is 60 to 65%. The left ventricle has normal function. The left ventricle has no regional wall motion abnormalities. The left ventricular internal cavity size was normal in size. There is  no left ventricular hypertrophy. Left ventricular diastolic parameters were normal. Right Ventricle: The right ventricular size is normal.Right ventricular systolic function is normal. Left Atrium: Left atrial size was normal in size. Right Atrium: Right atrial size was normal in size. Pericardium: There is no evidence of pericardial effusion. Mitral Valve: The mitral valve is normal in structure. Trivial mitral valve regurgitation. No evidence of mitral valve stenosis. Tricuspid Valve: The tricuspid valve is normal in structure. Tricuspid valve regurgitation is trivial. No evidence of tricuspid stenosis. Aortic Valve: The aortic valve is tricuspid. Aortic valve regurgitation is not visualized. Mild aortic valve sclerosis is present, with no evidence of aortic valve stenosis. Pulmonic Valve: The pulmonic valve was normal in structure. Pulmonic valve regurgitation is not visualized. No evidence of pulmonic stenosis. Aorta: Aortic dilatation noted. There is borderline dilatation of the aortic root, measuring 39 mm. Venous: The inferior vena cava is normal in size with greater than 50% respiratory variability, suggesting right atrial pressure of 3  mmHg. IAS/Shunts: No atrial level shunt detected by color flow Doppler.  LEFT VENTRICLE PLAX 2D LVIDd:         4.70 cm  Diastology LVIDs:         3.00 cm  LV e' medial:    9.68 cm/s LV PW:         1.00 cm  LV E/e' medial:  8.1 LV IVS:        1.00 cm  LV e' lateral:   10.30 cm/s LVOT diam:     2.30 cm  LV E/e' lateral: 7.6 LV SV:         101 LV SV Index:   52 LVOT Area:     4.15 cm  RIGHT VENTRICLE             IVC RV S prime:     13.50 cm/s  IVC diam: 1.50 cm TAPSE (M-mode): 2.1 cm LEFT ATRIUM             Index       RIGHT ATRIUM           Index LA diam:        3.10 cm 1.61 cm/m  RA Area:     13.40 cm LA Vol (A2C):  41.0 ml 21.28 ml/m RA Volume:   32.50 ml  16.87 ml/m LA Vol (A4C):   45.3 ml 23.52 ml/m LA Biplane Vol: 45.0 ml 23.36 ml/m  AORTIC VALVE LVOT Vmax:   102.00 cm/s LVOT Vmean:  63.300 cm/s LVOT VTI:    0.242 m  AORTA Ao Root diam: 3.80 cm Ao Asc diam:  3.90 cm MITRAL VALVE MV Area (PHT): 2.06 cm    SHUNTS MV Decel Time: 369 msec    Systemic VTI:  0.24 m MV E velocity: 78.00 cm/s  Systemic Diam: 2.30 cm MV A velocity: 64.70 cm/s MV E/A ratio:  1.21 Kirk Ruths MD Electronically signed by Kirk Ruths MD Signature Date/Time: 10/23/2020/2:15:45 PM    Final    Disposition   Pt is being discharged home today in good condition.  Follow-up Plans & Appointments     Follow-up Information    Almyra Deforest, [place] Follow up on 11/11/2020.   Specialties: Cardiology, Radiology Why: at 2:15pm for your follow up appt Contact information: [address] [place] [address] [place] Follow up on 11/01/2020.   Specialty: Cardiology Why: Please come in for follow up labs Contact information: [address] [place] [phone]             Discharge Instructions    Amb Referral to Cardiac Rehabilitation   Complete by: As directed    Diagnosis: STEMI   After initial evaluation and assessments  completed: Virtual Based Care may be provided alone or in conjunction with Phase 2 Cardiac Rehab based on patient barriers.: Yes   Call MD for:  difficulty breathing, headache or visual disturbances   Complete by: As directed    Call MD for:  persistant dizziness or light-headedness   Complete by: As directed    Call MD for:  redness, tenderness, or signs of infection (pain, swelling, redness, odor or green/yellow discharge around incision site)   Complete by: As directed    Diet - low sodium heart healthy   Complete by: As directed    Discharge instructions   Complete by: As directed    Radial Site Care Refer to this sheet in the next few weeks. These instructions provide you with information on caring for yourself after your procedure. Your caregiver may also give you more specific instructions. Your treatment has been planned according to current medical practices, but problems sometimes occur. Call your caregiver if you have any problems or questions after your procedure. HOME CARE INSTRUCTIONS You may shower the day after the procedure.Remove the bandage (dressing) and gently wash the site with plain soap and water.Gently pat the site dry.  Do not apply powder or lotion to the site.  Do not submerge the affected site in water for 3 to 5 days.  Inspect the site at least twice daily.  Do not flex or bend the affected arm for 24 hours.  No lifting over 5 pounds (2.3 kg) for 5 days after your procedure.  Do not drive home if you are discharged the same day of the procedure. Have someone else drive you.  You may drive 24 hours after the procedure unless otherwise instructed by your caregiver.  What to expect: Any bruising will usually fade within 1 to 2 weeks.  Blood that collects in the tissue (hematoma) may be painful to the touch. It should usually decrease in size and tenderness within 1 to 2 weeks.  SEEK IMMEDIATE MEDICAL CARE IF: You have unusual pain at the radial site.  You have  redness, warmth, swelling, or pain at the radial site.  You have drainage (other than a small amount of blood on the dressing).  You have chills.  You have a fever or persistent symptoms for more than 72 hours.  You have a fever and your symptoms suddenly get worse.  Your arm becomes pale, cool, tingly, or numb.  You have heavy bleeding from the site. Hold pressure on the site.   Increase activity slowly   Complete by: As directed       Discharge Medications   Allergies as of 10/26/2020      Reactions   Penicillins Hives, Itching, Rash   Has patient had a PCN reaction causing immediate rash, facial/tongue/throat swelling, SOB or lightheadedness with hypotension: No Has patient had a PCN reaction causing severe rash involving mucus membranes or skin necrosis: No Has patient had a PCN reaction that required hospitalization No Has patient had a PCN reaction occurring within the last 10 years: No If all of the above answers are "NO", then may proceed with Cephalosporin use.      Medication List    STOP taking these medications   lisinopril 10 MG tablet Commonly known as: ZESTRIL     TAKE these medications   acetaminophen 500 MG tablet Commonly known as: TYLENOL Take 1,000 mg by mouth every 6 (six) hours as needed for mild pain, fever or headache.   aspirin EC 81 MG tablet Take 1 tablet (81 mg total) by mouth daily.   atorvastatin 80 MG tablet Commonly known as: LIPITOR Take 80 mg by mouth daily.   cetirizine 10 MG tablet Commonly known as: ZYRTEC Take 10 mg by mouth daily.   cholecalciferol 1000 units tablet Commonly known as: VITAMIN D Take 1,000 Units by mouth every morning.   docusate sodium 100 MG capsule Commonly known as: COLACE Take 100 mg by mouth daily.   DULoxetine 30 MG capsule Commonly known as: CYMBALTA Take 90 mg by mouth at bedtime.   isosorbide mononitrate 30 MG 24 hr tablet Commonly known as: IMDUR Take 0.5 tablets (15 mg total) by mouth  daily. Start taking on: October 27, 2020   metoprolol succinate 25 MG 24 hr tablet Commonly known as: TOPROL-XL Take 25 mg by mouth daily.   multivitamin with minerals tablet Take 1 tablet by mouth every morning.   nitroGLYCERIN 0.4 MG SL tablet Commonly known as: NITROSTAT Place 1 tablet (0.4 mg total) under the tongue every 5 (five) minutes as needed for chest pain.   NON FORMULARY CPAP Machine - Use as directed at bedtime.   polyvinyl alcohol 1.4 % ophthalmic solution Commonly known as: LIQUIFILM TEARS Place 1 drop into both eyes as needed for dry eyes.   pregabalin 75 MG capsule Commonly known as: LYRICA Take 150 mg by mouth 2 (two) times daily.   sildenafil 100 MG tablet Commonly known as: VIAGRA Take 100 mg by mouth as needed for erectile dysfunction.   ticagrelor 90 MG Tabs tablet Commonly known as: BRILINTA Take 1 tablet (90 mg total) by mouth 2 (two) times daily.          Outstanding Labs/Studies   BMET 3/7  Duration of Discharge Encounter   Greater than 30 minutes including physician time.  Signed, Reino Bellis, NP 10/26/2020, 3:03 PM

## 2020-10-26 NOTE — Progress Notes (Signed)
CARDIAC REHAB PHASE I   PRE:  Rate/Rhythm: 61 SR  BP:  Supine: 81/70  Sitting: 124/70  Standing:    SaO2: 93 RA  MODE:  Ambulation: 470 ft   POST:  Rate/Rhythm: 71 SR  BP:  Supine:   Sitting: 133/78  Standing:    SaO2: 97 RA  Pt was reclining to bed when first bp was taken 81/70, rechecked with pt sitting on edge of bed and BP increased to 124/70. Asymptomatic with both Blood pressures. Pt tolerated exercise well and amb 470 ft with standby assist and no assistive device, no CP, SOB or pain on walk. Pt was left in recliner with call bell in reach.   [phone] Kirby Funk ACSM-EP 10/26/2020 8:35 AM

## 2020-11-02 ENCOUNTER — Telehealth (HOSPITAL_COMMUNITY): Payer: Self-pay

## 2020-11-02 NOTE — Telephone Encounter (Signed)
Attempted to call pt in regards to cardiac rehab, unable to leave a VM pt mailbox is full.

## 2020-11-02 NOTE — Telephone Encounter (Signed)
Pt insurance is active and benefits verified through [facility] care Co-pay $10, DED 0/0 met, out of pocket $3,900/0 met, co-insurance 0%. no pre-authorization required. Passport, [employer] 11/02/2020_0 :24pm, REF# [id number]  Will contact patient to see if he is interested in the Cardiac Rehab Program. If interested, patient will need to complete follow up appt. Once completed, patient will be contacted for scheduling upon review by the RN Navigator.

## 2020-11-08 ENCOUNTER — Telehealth: Payer: Self-pay | Admitting: Student

## 2020-11-08 NOTE — Telephone Encounter (Signed)
   I am working in the hospital today and was contacted by [facility] to call [place] in regards to refill of patient who was recently discharged on 10/26/2020 following NSTEMI. Request was for refill of Imdur as well as glucose test strips and glucometer. Called and spoke with [facility] Pharmacist and gave verbal order for refill of Imdur 15mg  daily. Asked that PCP be contacted for refill of diabetes equipment.   Darreld Mclean, PA-C 11/08/2020 2:45 PM

## 2020-11-11 ENCOUNTER — Encounter: Payer: Self-pay | Admitting: Physician Assistant

## 2020-11-11 ENCOUNTER — Other Ambulatory Visit: Payer: Self-pay

## 2020-11-11 ENCOUNTER — Ambulatory Visit: Payer: Medicare HMO | Admitting: Physician Assistant

## 2020-11-11 VITALS — BP 138/80 | HR 58 | Ht 68.0 in | Wt 172.6 lb

## 2020-11-11 DIAGNOSIS — I2510 Atherosclerotic heart disease of native coronary artery without angina pectoris: Secondary | ICD-10-CM

## 2020-11-11 DIAGNOSIS — N179 Acute kidney failure, unspecified: Secondary | ICD-10-CM

## 2020-11-11 DIAGNOSIS — I10 Essential (primary) hypertension: Secondary | ICD-10-CM | POA: Diagnosis not present

## 2020-11-11 DIAGNOSIS — Z8673 Personal history of transient ischemic attack (TIA), and cerebral infarction without residual deficits: Secondary | ICD-10-CM

## 2020-11-11 MED ORDER — ISOSORBIDE MONONITRATE ER 30 MG PO TB24: 15.0000 mg | ORAL_TABLET | Freq: Every day | ORAL | 3 refills | Status: DC

## 2020-11-11 NOTE — Progress Notes (Signed)
Cardiology Office Note:    Date:  11/13/2020   ID:  Daniel Cox, DOB 20-Jul-1946, MRN [id number]  PCP:  Mayra Neer, [place]  Cardiologist:  Sinclair Grooms, MD  Advanced Practice Provider:  No care team member to display Electrophysiologist:  None   Referring MD: Mayra Neer, MD   Chief Complaint  Patient presents with  . Follow-up    Seen for Dr. [name]    History of Present Illness:    Daniel Cox is a 75 y.o. 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  Patient had NSTEMI in 2014 and received a drug-eluting stent to LAD.  Procedure was complicated by acute CVA.  Fortunately the patient recovered without significant functional disability.  He was last seen by Dr. [name] in November 2020 at which time he was doing well.  More recently, he was admitted to the hospital on 10/23/2020 as a code STEMI.  Emergent cardiac catheterization performed on the same day revealed a 75% mid RCA disease, 80% distal RCA lesion, widely patent proximal LAD stent, 90% D1 lesion, 75% ostial left circumflex lesion, 85% OM1, 99% OM2 lesion.  Attempt was made to open up the OM 2 lesion without success secondary to severe tortuosity of the artery.  Aggressive medical therapy was recommended.  High-sensitivity troponin peaked at 120.  Echocardiogram revealed normal EF.  Patient was eventually discharged on aspirin and Brilinta.  Patient presents today for cardiology follow-up.  He denies any obvious chest pain or shortness of breath.  In the first few days after discharge, he did have some shortness of breath, this however has resolved.  He has no lower extremity edema, orthopnea or PND.  He is due for a basic metabolic panel to recheck his renal function to make sure his creatinine has returned to his baseline.  He requested a paper prescription of Imdur to take to the hospital to get it  filled over there.  Otherwise, I advised the patient to avoid Viagra within 48 hours of taking any nitrate as the combination of the two can potentially cause significant hypotension.  Overall, I think the patient is doing very well and he can follow-up with Dr. [name] in 2 to 3 months.   Past Medical History:  Diagnosis Date  . Chronic kidney disease   . Degenerative disc disease, cervical   . Degenerative lumbar disc   . Erectile dysfunction   . Hyperlipidemia     Past Surgical History:  Procedure Laterality Date  . Appendectony    . COLONOSCOPY WITH PROPOFOL N/A 08/16/2015   Procedure: COLONOSCOPY WITH PROPOFOL;  Surgeon: Garlan Fair, MD;  Location: [facility];  Service: Endoscopy;  Laterality: N/A;  . CORONARY/GRAFT ACUTE MI REVASCULARIZATION N/A 10/23/2020   Procedure: Coronary/Graft Acute MI Revascularization;  Surgeon: Lorretta Harp, MD;  Location: [place] [facility];  Service: Cardiovascular;  Laterality: N/A;  . LEFT HEART CATH AND CORONARY ANGIOGRAPHY N/A 10/23/2020   Procedure: LEFT HEART CATH AND CORONARY ANGIOGRAPHY;  Surgeon: Lorretta Harp, MD;  Location: [place] [facility];  Service: Cardiovascular;  Laterality: N/A;  . LEFT HEART CATHETERIZATION WITH CORONARY ANGIOGRAM N/A 02/06/2013   Procedure: LEFT HEART CATHETERIZATION WITH CORONARY ANGIOGRAM;  Surgeon: Sinclair Grooms, MD;  Location: [facility] CATH LAB;  Service: Cardiovascular;  Laterality: N/A;  . PERCUTANEOUS CORONARY STENT INTERVENTION (PCI-S) N/A 02/10/2013  Procedure: PERCUTANEOUS CORONARY STENT INTERVENTION (PCI-S);  Surgeon: Sinclair Grooms, MD;  Location: [facility] CATH LAB;  Service: Cardiovascular;  Laterality: N/A;  . Right shoulder      Current Medications: Current Meds  Medication Sig  . aspirin EC 81 MG tablet Take 1 tablet (81 mg total) by mouth daily.  Marland Kitchen atorvastatin (LIPITOR) 80 MG tablet Take 80 mg by mouth daily.  . cetirizine (ZYRTEC) 10 MG tablet Take 10 mg by mouth daily.  .  cholecalciferol (VITAMIN D) 1000 UNITS tablet Take 1,000 Units by mouth every morning.  . docusate sodium (COLACE) 100 MG capsule Take 100 mg by mouth daily.  . DULoxetine (CYMBALTA) 30 MG capsule Take 90 mg by mouth at bedtime.  . metoprolol succinate (TOPROL-XL) 25 MG 24 hr tablet Take 25 mg by mouth daily.  . Multiple Vitamins-Minerals (MULTIVITAMIN WITH MINERALS) tablet Take 1 tablet by mouth every morning.  . nitroGLYCERIN (NITROSTAT) 0.4 MG SL tablet Place 1 tablet (0.4 mg total) under the tongue every 5 (five) minutes as needed for chest pain.  . NON FORMULARY CPAP Machine - Use as directed at bedtime.  . polyvinyl alcohol (LIQUIFILM TEARS) 1.4 % ophthalmic solution Place 1 drop into both eyes as needed for dry eyes.  . pregabalin (LYRICA) 75 MG capsule Take 150 mg by mouth 2 (two) times daily.  . sildenafil (VIAGRA) 100 MG tablet Take 100 mg by mouth as needed for erectile dysfunction.  . ticagrelor (BRILINTA) 90 MG TABS tablet Take 1 tablet (90 mg total) by mouth 2 (two) times daily.  . [DISCONTINUED] isosorbide mononitrate (IMDUR) 30 MG 24 hr tablet Take 0.5 tablets (15 mg total) by mouth daily.     Allergies:   Penicillins   Social History   Socioeconomic History  . Marital status: Married    Spouse name: Not on file  . Number of children: 3  . Years of education: Not on file  . Highest education level: Not on file  Occupational History  . Not on file  Tobacco Use  . Smoking status: Former Smoker    Packs/day: 0.50    Years: 30.00    Pack years: 15.00  . Smokeless tobacco: Never Used  . Tobacco comment: 2 weeks ago from 06-04-13  Vaping Use  . Vaping Use: Never used  Substance and Sexual Activity  . Alcohol use: No  . Drug use: No  . Sexual activity: Yes  Other Topics Concern  . Not on file  Social History Narrative  . Not on file   Social Determinants of Health   Financial Resource Strain: Not on file  Food Insecurity: Not on file  Transportation Needs: Not  on file  Physical Activity: Not on file  Stress: Not on file  Social Connections: Not on file     Family History: The patient's family history includes Colon cancer in his sister; Diabetes in his sister; Heart attack in his brother, brother, father, and mother; Heart disease in his sister.  ROS:   Please see the history of present illness.     All other systems reviewed and are negative.  EKGs/Labs/Other Studies Reviewed:    The following studies were reviewed today:   Echo 10/23/2020 1. Left ventricular ejection fraction, by estimation, is 60 to 65%. The  left ventricle has normal function. The left ventricle has no regional  wall motion abnormalities. Left ventricular diastolic parameters were  normal.  2. Right ventricular systolic function is normal. The right ventricular  size  is normal.  3. The mitral valve is normal in structure. Trivial mitral valve  regurgitation. No evidence of mitral stenosis.  4. The aortic valve is tricuspid. Aortic valve regurgitation is not  visualized. Mild aortic valve sclerosis is present, with no evidence of  aortic valve stenosis.  5. Aortic dilatation noted. There is borderline dilatation of the aortic  root, measuring 39 mm.  6. The inferior vena cava is normal in size with greater than 50%  respiratory variability, suggesting right atrial pressure of 3 mmHg.    Cath 10/23/2020  Mid RCA lesion is 75% stenosed.  Dist RCA lesion is 80% stenosed.  Previously placed Prox LAD to Mid LAD stent (unknown type) is widely patent.  1st Diag lesion is 90% stenosed.  Ost Cx to Prox Cx lesion is 75% stenosed.  1st Mrg lesion is 85% stenosed.  2nd Mrg lesion is 99% stenosed.     EKG:  EKG is ordered today.  The ekg ordered today demonstrates normal sinus rhythm, no significant ST-T wave changes.  Recent Labs: 10/23/2020: ALT 19 10/26/2020: Hemoglobin 11.4; Platelets 129 11/11/2020: BUN 18; Creatinine, Ser 1.49; Potassium 4.0; Sodium  140  Recent Lipid Panel    Component Value Date/Time   CHOL 132 10/23/2020 0741   CHOL 137 06/12/2017 0911   TRIG 68 10/23/2020 0741   HDL 41 10/23/2020 0741   HDL 37 (L) 06/12/2017 0911   CHOLHDL 3.2 10/23/2020 0741   VLDL 14 10/23/2020 0741   LDLCALC 77 10/23/2020 0741   LDLCALC 85 06/12/2017 0911     Risk Assessment/Calculations:       Physical Exam:    VS:  BP 138/80   Pulse (!) 58   Ht 5\' 8"  (1.727 m)   Wt 172 lb 9.6 oz (78.3 kg)   SpO2 98%   BMI 26.24 kg/m     Wt Readings from Last 3 Encounters:  11/11/20 172 lb 9.6 oz (78.3 kg)  10/24/20 162 lb 14.7 oz (73.9 kg)  07/16/19 167 lb (75.8 kg)     GEN:  Well nourished, well developed in no acute distress HEENT: Normal NECK: No JVD; No carotid bruits LYMPHATICS: No lymphadenopathy CARDIAC: RRR, no murmurs, rubs, gallops RESPIRATORY:  Clear to auscultation without rales, wheezing or rhonchi  ABDOMEN: Soft, non-tender, non-distended MUSCULOSKELETAL:  No edema; No deformity  SKIN: Warm and dry NEUROLOGIC:  Alert and oriented x 3 PSYCHIATRIC:  Normal affect   ASSESSMENT:    1. Coronary artery disease involving native coronary artery of native heart without angina pectoris   2. AKI (acute kidney injury) ([place])   3. Essential hypertension, benign   4. H/O: CVA (cerebrovascular accident)    PLAN:    In order of problems listed above:  1. CAD: Recent cardiac catheterization showed 99% OM 2 lesion, attempted PCI was unsuccessful.  Medical therapy was recommended.  He denies any chest pain since discharge  2. AKI: Obtain basic metabolic panel  3. Hypertension: Well-controlled on current therapy.  4. History of CVA: Occurred in the setting of previous PCI        Medication Adjustments/Labs and Tests Ordered: Current medicines are reviewed at length with the patient today.  Concerns regarding medicines are outlined above.  Orders Placed This Encounter  Procedures  . Basic metabolic panel  . EKG 12-Lead    Meds ordered this encounter  Medications  . isosorbide mononitrate (IMDUR) 30 MG 24 hr tablet    Sig: Take 0.5 tablets (15 mg total) by mouth  daily.    Dispense:  45 tablet    Refill:  3    Patient Instructions  Medication Instructions:  Your physician recommends that you continue on your current medications as directed. Please refer to the Current Medication list given to you today.  *If you need a refill on your cardiac medications before your next appointment, please call your pharmacy*  Lab Work: Your physician recommends that you return for lab work TODAY:  BMET  If you have labs (blood work) drawn today and your tests are completely normal, you will receive your results only by: Marland Kitchen MyChart Message (if you have MyChart) OR . A paper copy in the mail If you have any lab test that is abnormal or we need to change your treatment, we will call you to review the results.  Testing/Procedures: NONE ordered at this time of appointment   Follow-Up: At [facility], you and your health needs are our priority.  As part of our continuing mission to provide you with exceptional heart care, we have created designated Provider Care Teams.  These Care Teams include your primary Cardiologist (physician) and Advanced Practice Providers (APPs -  Physician Assistants and Nurse Practitioners) who all work together to provide you with the care you need, when you need it.  We recommend signing up for the patient portal called "MyChart".  Sign up information is provided on this After Visit Summary.  MyChart is used to connect with patients for Virtual Visits (Telemedicine).  Patients are able to view lab/test results, encounter notes, upcoming appointments, etc.  Non-urgent messages can be sent to your provider as well.   To learn more about what you can do with MyChart, go to [email or web address].    Your next appointment:   2 month(s)  The format for your next appointment:   In  Person  Provider:   Daneen Schick, MD  Other Instructions  AVOID taking Viagra within 48 hours of taking Imdur     Signed, Almyra Deforest, [place]  11/13/2020 11:22 PM    [place]

## 2020-11-11 NOTE — Patient Instructions (Addendum)
Medication Instructions:  Your physician recommends that you continue on your current medications as directed. Please refer to the Current Medication list given to you today.  *If you need a refill on your cardiac medications before your next appointment, please call your pharmacy*  Lab Work: Your physician recommends that you return for lab work TODAY:  BMET  If you have labs (blood work) drawn today and your tests are completely normal, you will receive your results only by: Marland Kitchen MyChart Message (if you have MyChart) OR . A paper copy in the mail If you have any lab test that is abnormal or we need to change your treatment, we will call you to review the results.  Testing/Procedures: NONE ordered at this time of appointment   Follow-Up: At [facility], you and your health needs are our priority.  As part of our continuing mission to provide you with exceptional heart care, we have created designated Provider Care Teams.  These Care Teams include your primary Cardiologist (physician) and Advanced Practice Providers (APPs -  Physician Assistants and Nurse Practitioners) who all work together to provide you with the care you need, when you need it.  We recommend signing up for the patient portal called "MyChart".  Sign up information is provided on this After Visit Summary.  MyChart is used to connect with patients for Virtual Visits (Telemedicine).  Patients are able to view lab/test results, encounter notes, upcoming appointments, etc.  Non-urgent messages can be sent to your provider as well.   To learn more about what you can do with MyChart, go to [email or web address].    Your next appointment:   2 month(s)  The format for your next appointment:   In Person  Provider:   Daneen Schick, MD  Other Instructions  AVOID taking Viagra within 48 hours of taking Imdur

## 2020-11-12 LAB — BASIC METABOLIC PANEL
BUN/Creatinine Ratio: 12 (ref 10–24)
BUN: 18 mg/dL (ref 8–27)
CO2: 23 mmol/L (ref 20–29)
Calcium: 9.4 mg/dL (ref 8.6–10.2)
Chloride: 104 mmol/L (ref 96–106)
Creatinine, Ser: 1.49 mg/dL — ABNORMAL HIGH (ref 0.76–1.27)
Glucose: 151 mg/dL — ABNORMAL HIGH (ref 65–99)
Potassium: 4 mmol/L (ref 3.5–5.2)
Sodium: 140 mmol/L (ref 134–144)
eGFR: 49 mL/min/{1.73_m2} — ABNORMAL LOW (ref >59)

## 2020-11-12 NOTE — Progress Notes (Signed)
Kidney function has returned back to the previous baseline. High potassium resolved.

## 2020-11-13 ENCOUNTER — Encounter: Payer: Self-pay | Admitting: Physician Assistant

## 2020-11-24 DIAGNOSIS — N183 Chronic kidney disease, stage 3 unspecified: Secondary | ICD-10-CM | POA: Diagnosis not present

## 2020-11-24 DIAGNOSIS — Z125 Encounter for screening for malignant neoplasm of prostate: Secondary | ICD-10-CM | POA: Diagnosis not present

## 2020-11-24 DIAGNOSIS — R7303 Prediabetes: Secondary | ICD-10-CM | POA: Diagnosis not present

## 2020-11-24 DIAGNOSIS — N400 Enlarged prostate without lower urinary tract symptoms: Secondary | ICD-10-CM | POA: Diagnosis not present

## 2020-11-24 DIAGNOSIS — Z0000 Encounter for general adult medical examination without abnormal findings: Secondary | ICD-10-CM | POA: Diagnosis not present

## 2020-11-24 DIAGNOSIS — I2510 Atherosclerotic heart disease of native coronary artery without angina pectoris: Secondary | ICD-10-CM | POA: Diagnosis not present

## 2020-11-24 DIAGNOSIS — I209 Angina pectoris, unspecified: Secondary | ICD-10-CM | POA: Diagnosis not present

## 2020-11-24 DIAGNOSIS — E7800 Pure hypercholesterolemia, unspecified: Secondary | ICD-10-CM | POA: Diagnosis not present

## 2020-11-24 DIAGNOSIS — K219 Gastro-esophageal reflux disease without esophagitis: Secondary | ICD-10-CM | POA: Diagnosis not present

## 2020-11-24 DIAGNOSIS — Z87891 Personal history of nicotine dependence: Secondary | ICD-10-CM | POA: Diagnosis not present

## 2020-11-24 DIAGNOSIS — I119 Hypertensive heart disease without heart failure: Secondary | ICD-10-CM | POA: Diagnosis not present

## 2020-11-24 DIAGNOSIS — M549 Dorsalgia, unspecified: Secondary | ICD-10-CM | POA: Diagnosis not present

## 2020-12-15 ENCOUNTER — Other Ambulatory Visit: Payer: Self-pay

## 2020-12-15 MED ORDER — TICAGRELOR 90 MG PO TABS: ORAL_TABLET | Freq: Two times a day (BID) | ORAL | 3 refills | Status: DC

## 2020-12-15 NOTE — Telephone Encounter (Signed)
Pt's medication was sent to pt's pharmacy as requested. Confirmation received.  °

## 2020-12-17 ENCOUNTER — Encounter (HOSPITAL_COMMUNITY): Payer: Self-pay

## 2020-12-17 ENCOUNTER — Telehealth (HOSPITAL_COMMUNITY): Payer: Self-pay

## 2020-12-17 NOTE — Telephone Encounter (Signed)
Attempted to call patient in regards to Cardiac Rehab - LM on VM Mailed letter 

## 2021-01-04 NOTE — Telephone Encounter (Signed)
No response from pt.  Closed referral  

## 2021-01-17 DIAGNOSIS — Z20822 Contact with and (suspected) exposure to covid-19: Secondary | ICD-10-CM | POA: Diagnosis not present

## 2021-01-18 NOTE — Progress Notes (Signed)
Cardiology Office Note:    Date:  01/19/2021   ID:  Daniel Cox, DOB 1946-04-23, MRN [id number]  PCP:  Mayra Neer, MD  Cardiologist:  Sinclair Grooms, MD   Referring MD: Mayra Neer, MD   Chief Complaint  Patient presents with  . Follow-up    Shortness of breath CAD Hyperlipidemia Hypertension    History of Present Illness:    Daniel Cox is a 75 y.o. male with a hx of CAD, LAD drug-eluting stent during non-ST elevation MI in 2014, hypertension, left ventricular hypertrophy, embolic CVAcomplicating coronary angiography, and chronic back discomfort.   Shortness of breath since discharge from the hospital.  Correlates the shortness of breath to dosing of Brilinta.  He develops a deep breathing and air hunger about an hour after a Brilinta dose.  He has not had any angina.  He is ambulatory.  He has not been lightheaded or dizzy.  He has not needed to take sublingual nitroglycerin.  Past Medical History:  Diagnosis Date  . Chronic kidney disease   . Degenerative disc disease, cervical   . Degenerative lumbar disc   . Erectile dysfunction   . Hyperlipidemia     Past Surgical History:  Procedure Laterality Date  . Appendectony    . COLONOSCOPY WITH PROPOFOL N/A 08/16/2015   Procedure: COLONOSCOPY WITH PROPOFOL;  Surgeon: Garlan Fair, MD;  Location: [facility];  Service: Endoscopy;  Laterality: N/A;  . CORONARY/GRAFT ACUTE MI REVASCULARIZATION N/A 10/23/2020   Procedure: Coronary/Graft Acute MI Revascularization;  Surgeon: Lorretta Harp, MD;  Location: [place] [facility];  Service: Cardiovascular;  Laterality: N/A;  . LEFT HEART CATH AND CORONARY ANGIOGRAPHY N/A 10/23/2020   Procedure: LEFT HEART CATH AND CORONARY ANGIOGRAPHY;  Surgeon: Lorretta Harp, MD;  Location: [place] [facility];  Service: Cardiovascular;  Laterality: N/A;  . LEFT HEART CATHETERIZATION WITH CORONARY ANGIOGRAM N/A 02/06/2013   Procedure: LEFT HEART CATHETERIZATION WITH  CORONARY ANGIOGRAM;  Surgeon: Sinclair Grooms, MD;  Location: [facility] CATH LAB;  Service: Cardiovascular;  Laterality: N/A;  . PERCUTANEOUS CORONARY STENT INTERVENTION (PCI-S) N/A 02/10/2013   Procedure: PERCUTANEOUS CORONARY STENT INTERVENTION (PCI-S);  Surgeon: Sinclair Grooms, MD;  Location: [facility] CATH LAB;  Service: Cardiovascular;  Laterality: N/A;  . Right shoulder      Current Medications: Current Meds  Medication Sig  . aspirin EC 81 MG tablet Take 1 tablet (81 mg total) by mouth daily.  Marland Kitchen atorvastatin (LIPITOR) 80 MG tablet Take 80 mg by mouth daily.  . cetirizine (ZYRTEC) 10 MG tablet Take 10 mg by mouth daily.  . cholecalciferol (VITAMIN D) 1000 UNITS tablet Take 1,000 Units by mouth every morning.  . clopidogrel (PLAVIX) 75 MG tablet Take 1 tablet (75 mg total) by mouth daily.  Marland Kitchen docusate sodium (COLACE) 100 MG capsule Take 100 mg by mouth daily.  . metoprolol succinate (TOPROL-XL) 25 MG 24 hr tablet Take 25 mg by mouth daily.  . Multiple Vitamins-Minerals (MULTIVITAMIN WITH MINERALS) tablet Take 1 tablet by mouth every morning.  . nitroGLYCERIN (NITROSTAT) 0.4 MG SL tablet Place 1 tablet (0.4 mg total) under the tongue every 5 (five) minutes as needed for chest pain.  . NON FORMULARY CPAP Machine - Use as directed at bedtime.  . polyvinyl alcohol (LIQUIFILM TEARS) 1.4 % ophthalmic solution Place 1 drop into both eyes as needed for dry eyes.  . pregabalin (LYRICA) 75 MG capsule Take 150 mg by mouth 2 (two) times daily.  . [  DISCONTINUED] DULoxetine (CYMBALTA) 30 MG capsule Take 90 mg by mouth at bedtime.  . [DISCONTINUED] isosorbide mononitrate (IMDUR) 30 MG 24 hr tablet Take 0.5 tablets (15 mg total) by mouth daily.  . [DISCONTINUED] sildenafil (VIAGRA) 100 MG tablet Take 100 mg by mouth as needed for erectile dysfunction.  . [DISCONTINUED] ticagrelor (BRILINTA) 90 MG TABS tablet TAKE 1 TABLET (90 MG TOTAL) BY MOUTH TWO TIMES DAILY.     Allergies:   Penicillins   Social History    Socioeconomic History  . Marital status: Married    Spouse name: Not on file  . Number of children: 3  . Years of education: Not on file  . Highest education level: Not on file  Occupational History  . Not on file  Tobacco Use  . Smoking status: Former Smoker    Packs/day: 0.50    Years: 30.00    Pack years: 15.00  . Smokeless tobacco: Never Used  . Tobacco comment: 2 weeks ago from 06-04-13  Vaping Use  . Vaping Use: Never used  Substance and Sexual Activity  . Alcohol use: No  . Drug use: No  . Sexual activity: Yes  Other Topics Concern  . Not on file  Social History Narrative  . Not on file   Social Determinants of Health   Financial Resource Strain: Not on file  Food Insecurity: Not on file  Transportation Needs: Not on file  Physical Activity: Not on file  Stress: Not on file  Social Connections: Not on file     Family History: The patient's family history includes Colon cancer in his sister; Diabetes in his sister; Heart attack in his brother, brother, father, and mother; Heart disease in his sister.  ROS:   Please see the history of present illness.    Not using Viagra.  Understands interaction with Imdur.  He is only on 15 mg of Imdur per day.  He was started on such a low-dose because of hypotension while in the hospital.  He has been warned that he should not use Viagra anymore.  All other systems reviewed and are negative.  EKGs/Labs/Other Studies Reviewed:    The following studies were reviewed today:  CARDIAC CATH 09/2020: Diagnostic Dominance: Right     EKG:  EKG did not get repeated today  Recent Labs: 10/23/2020: ALT 19 10/26/2020: Hemoglobin 11.4; Platelets 129 11/11/2020: BUN 18; Creatinine, Ser 1.49; Potassium 4.0; Sodium 140  Recent Lipid Panel    Component Value Date/Time   CHOL 132 10/23/2020 0741   CHOL 137 06/12/2017 0911   TRIG 68 10/23/2020 0741   HDL 41 10/23/2020 0741   HDL 37 (L) 06/12/2017 0911   CHOLHDL 3.2 10/23/2020  0741   VLDL 14 10/23/2020 0741   LDLCALC 77 10/23/2020 0741   LDLCALC 85 06/12/2017 0911    Physical Exam:    VS:  BP 128/88   Pulse 70   Ht 5\' 8"  (1.727 m)   Wt 171 lb 9.6 oz (77.8 kg)   SpO2 95%   BMI 26.09 kg/m     Wt Readings from Last 3 Encounters:  01/19/21 171 lb 9.6 oz (77.8 kg)  11/11/20 172 lb 9.6 oz (78.3 kg)  10/24/20 162 lb 14.7 oz (73.9 kg)     GEN: Appears younger than stated age. No acute distress HEENT: Normal NECK: No JVD. LYMPHATICS: No lymphadenopathy CARDIAC: No murmur. RRR S4 gallop, or edema. VASCULAR:  Normal Pulses. No bruits. RESPIRATORY:  Clear to auscultation without rales, wheezing  or rhonchi  ABDOMEN: Soft, non-tender, non-distended, No pulsatile mass, MUSCULOSKELETAL: No deformity  SKIN: Warm and dry NEUROLOGIC:  Alert and oriented x 3 PSYCHIATRIC:  Normal affect   ASSESSMENT:    1. Coronary artery disease involving native coronary artery of native heart without angina pectoris   2. Essential hypertension, benign   3. H/O: CVA (cerebrovascular accident)   4. Stage 3b chronic kidney disease ([employer] [facility])   5. Pure hypercholesterolemia    PLAN:    In order of problems listed above:  1. Diffuse CAD.  LAD is widely patent.  Treating circumflex and right coronary disease symptomatically with medical therapy.  He is having hyperpnea related to Brilinta.  Plan to switch antiplatelet to clopidogrel 75 mg/day.  DC Brilinta.  Avoid PDE 5 inhibitor therapy.  Isosorbide mononitrate increased from 15 to 30 mg/day. 2. Blood pressure control is excellent on current regimen of Toprol-XL 25 mg/day Imdur will be increased to 30 mg/day. 3. No neurological complaints 4. Most recent creatinine 1.49 March 2022 5. Continue high intensity atorvastatin 80 mg/day.  Most recent LDL 77.  Overall education and awareness concerning primary/secondary risk prevention was discussed in detail: LDL less than 70, hemoglobin A1c less than 7, blood pressure target less than  130/80 mmHg, >150 minutes of moderate aerobic activity per week, avoidance of smoking, weight control (via diet and exercise), and continued surveillance/management of/for obstructive sleep apnea.    Medication Adjustments/Labs and Tests Ordered: Current medicines are reviewed at length with the patient today.  Concerns regarding medicines are outlined above.  No orders of the defined types were placed in this encounter.  Meds ordered this encounter  Medications  . clopidogrel (PLAVIX) 75 MG tablet    Sig: Take 1 tablet (75 mg total) by mouth daily.    Dispense:  90 tablet    Refill:  3  . isosorbide mononitrate (IMDUR) 30 MG 24 hr tablet    Sig: Take 1 tablet (30 mg total) by mouth daily.    Dispense:  90 tablet    Refill:  3    Dose change    Patient Instructions  Medication Instructions:  1) DISCONTINUE Brilinta 2) START Plavix- you will take two tablets on your first day and then take only one tablet daily thereafter  *If you need a refill on your cardiac medications before your next appointment, please call your pharmacy*   Lab Work: None If you have labs (blood work) drawn today and your tests are completely normal, you will receive your results only by: Marland Kitchen MyChart Message (if you have MyChart) OR . A paper copy in the mail If you have any lab test that is abnormal or we need to change your treatment, we will call you to review the results.   Testing/Procedures: None   Follow-Up: At [facility], you and your health needs are our priority.  As part of our continuing mission to provide you with exceptional heart care, we have created designated Provider Care Teams.  These Care Teams include your primary Cardiologist (physician) and Advanced Practice Providers (APPs -  Physician Assistants and Nurse Practitioners) who all work together to provide you with the care you need, when you need it.  We recommend signing up for the patient portal called "MyChart".  Sign up  information is provided on this After Visit Summary.  MyChart is used to connect with patients for Virtual Visits (Telemedicine).  Patients are able to view lab/test results, encounter notes, upcoming appointments, etc.  Non-urgent  messages can be sent to your provider as well.   To learn more about what you can do with MyChart, go to [email or web address].    Your next appointment:   6-9 month(s)  The format for your next appointment:   In Person  Provider:   You may see Sinclair Grooms, MD or one of the following Advanced Practice Providers on your designated Care Team:    Kathyrn Drown, NP    Other Instructions      Signed, Sinclair Grooms, MD  01/19/2021 10:49 AM    [place]

## 2021-01-19 ENCOUNTER — Ambulatory Visit: Payer: Medicare HMO | Admitting: Interventional Cardiology

## 2021-01-19 ENCOUNTER — Encounter (INDEPENDENT_AMBULATORY_CARE_PROVIDER_SITE_OTHER): Payer: Self-pay

## 2021-01-19 ENCOUNTER — Encounter: Payer: Self-pay | Admitting: Interventional Cardiology

## 2021-01-19 ENCOUNTER — Other Ambulatory Visit: Payer: Self-pay

## 2021-01-19 VITALS — BP 128/88 | HR 70 | Ht 68.0 in | Wt 171.6 lb

## 2021-01-19 DIAGNOSIS — E7800 Pure hypercholesterolemia, unspecified: Secondary | ICD-10-CM

## 2021-01-19 DIAGNOSIS — I2510 Atherosclerotic heart disease of native coronary artery without angina pectoris: Secondary | ICD-10-CM | POA: Diagnosis not present

## 2021-01-19 DIAGNOSIS — I10 Essential (primary) hypertension: Secondary | ICD-10-CM

## 2021-01-19 DIAGNOSIS — Z8673 Personal history of transient ischemic attack (TIA), and cerebral infarction without residual deficits: Secondary | ICD-10-CM | POA: Diagnosis not present

## 2021-01-19 DIAGNOSIS — N1832 Chronic kidney disease, stage 3b: Secondary | ICD-10-CM

## 2021-01-19 MED ORDER — ISOSORBIDE MONONITRATE ER 30 MG PO TB24: 30.0000 mg | ORAL_TABLET | Freq: Every day | ORAL | 3 refills | Status: DC

## 2021-01-19 MED ORDER — CLOPIDOGREL BISULFATE 75 MG PO TABS: 75.0000 mg | ORAL_TABLET | Freq: Every day | ORAL | 3 refills | Status: DC

## 2021-01-19 NOTE — Patient Instructions (Signed)
Medication Instructions:  1) DISCONTINUE Brilinta 2) START Plavix- you will take two tablets on your first day and then take only one tablet daily thereafter  *If you need a refill on your cardiac medications before your next appointment, please call your pharmacy*   Lab Work: None If you have labs (blood work) drawn today and your tests are completely normal, you will receive your results only by: Marland Kitchen MyChart Message (if you have MyChart) OR . A paper copy in the mail If you have any lab test that is abnormal or we need to change your treatment, we will call you to review the results.   Testing/Procedures: None   Follow-Up: At [facility], you and your health needs are our priority.  As part of our continuing mission to provide you with exceptional heart care, we have created designated Provider Care Teams.  These Care Teams include your primary Cardiologist (physician) and Advanced Practice Providers (APPs -  Physician Assistants and Nurse Practitioners) who all work together to provide you with the care you need, when you need it.  We recommend signing up for the patient portal called "MyChart".  Sign up information is provided on this After Visit Summary.  MyChart is used to connect with patients for Virtual Visits (Telemedicine).  Patients are able to view lab/test results, encounter notes, upcoming appointments, etc.  Non-urgent messages can be sent to your provider as well.   To learn more about what you can do with MyChart, go to [email or web address].    Your next appointment:   6-9 month(s)  The format for your next appointment:   In Person  Provider:   You may see Sinclair Grooms, MD or one of the following Advanced Practice Providers on your designated Care Team:    Kathyrn Drown, NP    Other Instructions

## 2021-01-25 ENCOUNTER — Other Ambulatory Visit: Payer: Self-pay

## 2021-01-25 DIAGNOSIS — Z0279 Encounter for issue of other medical certificate: Secondary | ICD-10-CM

## 2021-01-25 MED ORDER — NITROGLYCERIN 0.4 MG SL SUBL: 0.4000 mg | SUBLINGUAL_TABLET | SUBLINGUAL | 7 refills | Status: AC | PRN

## 2021-01-25 NOTE — Telephone Encounter (Signed)
Pt's medication was sent to pt's pharmacy as requested. Confirmation received.  °

## 2021-01-26 ENCOUNTER — Telehealth: Payer: Self-pay | Admitting: Interventional Cardiology

## 2021-01-26 NOTE — Telephone Encounter (Signed)
Patient came in to drop off forms, make payment, and sign authorization. HIM received forms. Forms have been placed in Dr. Thompson Caul box to be completed. AO 01/26/21

## 2021-02-04 NOTE — Telephone Encounter (Signed)
HIM received the form back from Doctor. Form has been faxed to [employer] as requested.  AO 02/04/21

## 2021-02-07 ENCOUNTER — Telehealth: Payer: Self-pay | Admitting: *Deleted

## 2021-02-07 NOTE — Telephone Encounter (Signed)
   [facility] Pre-operative Risk Assessment    Patient Name: BROEDY OSBOURNE  DOB: Feb 24, 1946  MRN: [id number]   HEARTCARE STAFF: - Please ensure there is not already an duplicate clearance open for this procedure. - Under Visit Info/Reason for Call, type in Other and utilize the format Clearance MM/DD/YY or Clearance TBD. Do not use dashes or single digits. - If request is for dental extraction, please clarify the # of teeth to be extracted. - If the patient is currently at the dentist's office, call Pre-Op APP to address. If the patient is not currently in the dentist office, please route to the Pre-Op pool  Request for surgical clearance:  What type of surgery is being performed? COLONOSCOPY (H/O COLON POLYPS)   When is this surgery scheduled? 04/13/21   What type of clearance is required (medical clearance vs. Pharmacy clearance to hold med vs. Both)? MEDICAL  Are there any medications that need to be held prior to surgery and how long? ASA AND PLAVIX    Practice name and name of physician performing surgery? [facility]; DR, OUTLAW   What is the office phone number? [phone]   7.   What is the office fax number? [phone]  8.   Anesthesia type (None, local, MAC, general) ? PROPOFOL   Julaine Hua 02/07/2021, 12:08 PM  _________________________________________________________________   (provider comments below)

## 2021-02-07 NOTE — Telephone Encounter (Signed)
Dr. [name]  We received clearance request on this patient for colonoscopy. Team is asking to hold ASA and Plavix. I see that he was admitted 09/2020 for STEMI at which time there was an unsuccessful attempt to open the high grade OM2 vessel due to severe tortuosity. Recommendations per cath notes are for DAPT for 1 year uninterrupted. He was transitioned off Brilinta for Plavix due to SOB. Will he need to wait until 09/2021 despite no stent placement for his procedure?   Please send your recommendations to the pre-op pool   Thank you

## 2021-02-08 NOTE — Telephone Encounter (Signed)
I have forward notes to requesting office with recommendations from the cardiologist.

## 2021-02-08 NOTE — Telephone Encounter (Signed)
   Name:  Daniel Cox  DOB:  08/21/1946  MRN:  [id number]   Primary Cardiologist: Sinclair Grooms, MD  Chart reviewed as part of pre-operative protocol coverage. Patient was contacted 02/08/2021 in reference to pre-operative risk assessment for pending surgery as outlined below.  Daniel Cox was admitted 09/2020 for a STEMI at which time there was an unsuccessful attempt to open the high grade OM2 vessel due to severe tortuosity. Recommendations per cath notes are for DAPT for 1 year uninterrupted. He was transitioned off Brilinta for Plavix due to SOB. Per Dr. [name], the patient will need to wait until 04/22/21 before he is able to hold antiplatelet therapy. Please resend clearance closer to that time.   Pre-op covering staff: - Please contact requesting surgeon's office via preferred method (i.e, phone, fax) to inform them of need for appointment prior to surgery.  Kathyrn Drown, NP 02/08/2021, 3:37 PM

## 2021-03-10 ENCOUNTER — Other Ambulatory Visit: Payer: Self-pay | Admitting: *Deleted

## 2021-03-10 MED ORDER — ISOSORBIDE MONONITRATE ER 30 MG PO TB24: 30.0000 mg | ORAL_TABLET | Freq: Every day | ORAL | 3 refills | Status: DC

## 2021-03-10 MED ORDER — CLOPIDOGREL BISULFATE 75 MG PO TABS: 75.0000 mg | ORAL_TABLET | Freq: Every day | ORAL | 3 refills | Status: DC

## 2021-06-22 DIAGNOSIS — R059 Cough, unspecified: Secondary | ICD-10-CM | POA: Diagnosis not present

## 2021-06-22 DIAGNOSIS — J069 Acute upper respiratory infection, unspecified: Secondary | ICD-10-CM | POA: Diagnosis not present

## 2021-06-22 DIAGNOSIS — Z20822 Contact with and (suspected) exposure to covid-19: Secondary | ICD-10-CM | POA: Diagnosis not present

## 2021-09-30 DIAGNOSIS — L309 Dermatitis, unspecified: Secondary | ICD-10-CM | POA: Diagnosis not present

## 2021-09-30 DIAGNOSIS — N183 Chronic kidney disease, stage 3 unspecified: Secondary | ICD-10-CM | POA: Diagnosis not present

## 2021-09-30 DIAGNOSIS — I2510 Atherosclerotic heart disease of native coronary artery without angina pectoris: Secondary | ICD-10-CM | POA: Diagnosis not present

## 2021-09-30 DIAGNOSIS — R109 Unspecified abdominal pain: Secondary | ICD-10-CM | POA: Diagnosis not present

## 2021-09-30 DIAGNOSIS — M549 Dorsalgia, unspecified: Secondary | ICD-10-CM | POA: Diagnosis not present

## 2021-10-28 ENCOUNTER — Other Ambulatory Visit: Payer: Self-pay | Admitting: Family Medicine

## 2021-10-28 ENCOUNTER — Ambulatory Visit
Admission: RE | Admit: 2021-10-28 | Discharge: 2021-10-28 | Disposition: A | Discharge status: Home / Self Care | Payer: Medicare Other | Source: Ambulatory Visit | Attending: Family Medicine | Admitting: Family Medicine

## 2021-10-28 DIAGNOSIS — M25461 Effusion, right knee: Secondary | ICD-10-CM | POA: Diagnosis not present

## 2021-10-28 DIAGNOSIS — R52 Pain, unspecified: Secondary | ICD-10-CM

## 2021-10-28 DIAGNOSIS — M25561 Pain in right knee: Secondary | ICD-10-CM | POA: Diagnosis not present

## 2021-12-14 ENCOUNTER — Telehealth: Payer: Self-pay | Admitting: Interventional Cardiology

## 2021-12-14 MED ORDER — METOPROLOL SUCCINATE ER 25 MG PO TB24: 25.0000 mg | ORAL_TABLET | Freq: Every day | ORAL | 0 refills | Status: DC

## 2021-12-14 MED ORDER — CLOPIDOGREL BISULFATE 75 MG PO TABS: 75.0000 mg | ORAL_TABLET | Freq: Every day | ORAL | 0 refills | Status: DC

## 2021-12-14 MED ORDER — ISOSORBIDE MONONITRATE ER 30 MG PO TB24: 30.0000 mg | ORAL_TABLET | Freq: Every day | ORAL | 0 refills | Status: DC

## 2021-12-14 NOTE — Telephone Encounter (Signed)
Pt's medications were sent to pt's pharmacy as requested. Confirmation received.  

## 2021-12-14 NOTE — Telephone Encounter (Signed)
?*  STAT* If patient is at the pharmacy, call can be transferred to refill team. ? ? ?1. Which medications need to be refilled? (please list name of each medication and dose if known)  new prescriptions for Metoprolol, Clopidogrel and Isosorbide- changing pharmacy ? ?2. Which pharmacy/location (including street and city if local pharmacy) is medication to be sent to?[facility] ? ?3. Do they need a 30 day or 90 day supply? 90 days and refills ? ?

## 2022-01-25 DIAGNOSIS — I2510 Atherosclerotic heart disease of native coronary artery without angina pectoris: Secondary | ICD-10-CM | POA: Diagnosis not present

## 2022-01-25 DIAGNOSIS — I119 Hypertensive heart disease without heart failure: Secondary | ICD-10-CM | POA: Diagnosis not present

## 2022-01-25 DIAGNOSIS — E7800 Pure hypercholesterolemia, unspecified: Secondary | ICD-10-CM | POA: Diagnosis not present

## 2022-01-25 DIAGNOSIS — M549 Dorsalgia, unspecified: Secondary | ICD-10-CM | POA: Diagnosis not present

## 2022-01-25 DIAGNOSIS — R7303 Prediabetes: Secondary | ICD-10-CM | POA: Diagnosis not present

## 2022-01-25 DIAGNOSIS — M25569 Pain in unspecified knee: Secondary | ICD-10-CM | POA: Diagnosis not present

## 2022-01-25 DIAGNOSIS — I209 Angina pectoris, unspecified: Secondary | ICD-10-CM | POA: Diagnosis not present

## 2022-01-25 DIAGNOSIS — N183 Chronic kidney disease, stage 3 unspecified: Secondary | ICD-10-CM | POA: Diagnosis not present

## 2022-01-25 DIAGNOSIS — Z0000 Encounter for general adult medical examination without abnormal findings: Secondary | ICD-10-CM | POA: Diagnosis not present

## 2022-01-25 DIAGNOSIS — K219 Gastro-esophageal reflux disease without esophagitis: Secondary | ICD-10-CM | POA: Diagnosis not present

## 2022-02-14 ENCOUNTER — Other Ambulatory Visit: Payer: Self-pay | Admitting: Interventional Cardiology

## 2022-02-15 DIAGNOSIS — M25521 Pain in right elbow: Secondary | ICD-10-CM | POA: Diagnosis not present

## 2022-02-15 DIAGNOSIS — M25561 Pain in right knee: Secondary | ICD-10-CM | POA: Diagnosis not present

## 2022-02-24 ENCOUNTER — Ambulatory Visit: Payer: Medicare Other | Admitting: Interventional Cardiology

## 2022-02-24 ENCOUNTER — Encounter: Payer: Self-pay | Admitting: Interventional Cardiology

## 2022-02-24 VITALS — BP 138/74 | HR 76 | Ht 68.0 in | Wt 159.2 lb

## 2022-02-24 DIAGNOSIS — E7800 Pure hypercholesterolemia, unspecified: Secondary | ICD-10-CM | POA: Diagnosis not present

## 2022-02-24 DIAGNOSIS — Z720 Tobacco use: Secondary | ICD-10-CM

## 2022-02-24 DIAGNOSIS — N1832 Chronic kidney disease, stage 3b: Secondary | ICD-10-CM | POA: Diagnosis not present

## 2022-02-24 DIAGNOSIS — G4733 Obstructive sleep apnea (adult) (pediatric): Secondary | ICD-10-CM

## 2022-02-24 DIAGNOSIS — I10 Essential (primary) hypertension: Secondary | ICD-10-CM | POA: Diagnosis not present

## 2022-02-24 DIAGNOSIS — I2510 Atherosclerotic heart disease of native coronary artery without angina pectoris: Secondary | ICD-10-CM | POA: Diagnosis not present

## 2022-02-24 DIAGNOSIS — Z8673 Personal history of transient ischemic attack (TIA), and cerebral infarction without residual deficits: Secondary | ICD-10-CM

## 2022-02-24 NOTE — Progress Notes (Signed)
Cardiology Office Note:    Date:  02/24/2022   ID:  Daniel Cox, DOB February 09, 1946, MRN [id number]  PCP:  Mayra Neer, MD  Cardiologist:  Sinclair Grooms, MD   Referring MD: Mayra Neer, MD   Chief Complaint  Patient presents with   Coronary Artery Disease   Hypertension   Follow-up    History of Present Illness:    Daniel Cox is a 76 y.o. male with a hx of CAD, LAD drug-eluting stent during non-ST elevation MI in 2014, hypertension, left ventricular hypertrophy, embolic CVA complicating coronary angiography, obstructive sleep apnea on CPAP 2019 diagnosis., prediabetes, tobacco use, and chronic back discomfort.    Daniel Cox is doing well.  Dr. Mayra Neer a sure he came back for cardiology follow-up.  He has no cardiac complaints.  Denies angina.  Remains active, he does his yard work causes him to rest a little more now than it did 5 years ago.  He denies angina.  No nitroglycerin use.  Continues to smoke cigarettes.  In reviewing his records he was diagnosed with sleep apnea in 2019.  This had not been carried along in my problem list.  He is on CPAP.  His difficulty sleeping began in the early 2000's.  Did not know he had sleep apnea until he was tested at the [place] in 2019.  Now he feels well rested.   Past Medical History:  Diagnosis Date   Chronic kidney disease    Degenerative disc disease, cervical    Degenerative lumbar disc    Erectile dysfunction    Hyperlipidemia     Past Surgical History:  Procedure Laterality Date   Appendectony     COLONOSCOPY WITH PROPOFOL N/A 08/16/2015   Procedure: COLONOSCOPY WITH PROPOFOL;  Surgeon: Garlan Fair, MD;  Location: [facility];  Service: Endoscopy;  Laterality: N/A;   CORONARY/GRAFT ACUTE MI REVASCULARIZATION N/A 10/23/2020   Procedure: Coronary/Graft Acute MI Revascularization;  Surgeon: Lorretta Harp, MD;  Location: [facility];  Service: Cardiovascular;  Laterality: N/A;   LEFT HEART  CATH AND CORONARY ANGIOGRAPHY N/A 10/23/2020   Procedure: LEFT HEART CATH AND CORONARY ANGIOGRAPHY;  Surgeon: Lorretta Harp, MD;  Location: [facility];  Service: Cardiovascular;  Laterality: N/A;   LEFT HEART CATHETERIZATION WITH CORONARY ANGIOGRAM N/A 02/06/2013   Procedure: LEFT HEART CATHETERIZATION WITH CORONARY ANGIOGRAM;  Surgeon: Sinclair Grooms, MD;  Location: [facility] C[facility] LAB;  Service: Cardiovascular;  Laterality: N/A;   PERCUTANEOUS CORONARY STENT INTERVENTION (PCI-S) N/A 02/10/2013   Procedure: PERCUTANEOUS CORONARY STENT INTERVENTION (PCI-S);  Surgeon: Sinclair Grooms, MD;  Location: [facility] CATH LAB;  Service: Cardiovascular;  Laterality: N/A;   Right shoulder      Current Medications: Current Meds  Medication Sig   aspirin EC 81 MG tablet Take 1 tablet (81 mg total) by mouth daily.   atorvastatin (LIPITOR) 80 MG tablet Take 80 mg by mouth daily.   cetirizine (ZYRTEC) 10 MG tablet Take 10 mg by mouth daily.   cholecalciferol (VITAMIN D) 1000 UNITS tablet Take 1,000 Units by mouth every morning.   clopidogrel (PLAVIX) 75 MG tablet TAKE 1 TABLET BY MOUTH DAILY   docusate sodium (COLACE) 100 MG capsule Take 100 mg by mouth daily.   ezetimibe (ZETIA) 10 MG tablet Take 10 mg by mouth daily.   isosorbide mononitrate (IMDUR) 30 MG 24 hr tablet TAKE 1 TABLET BY MOUTH DAILY   metoprolol succinate (TOPROL-XL) 25 MG 24  hr tablet TAKE 1 TABLET BY MOUTH DAILY   Multiple Vitamins-Minerals (MULTIVITAMIN WITH MINERALS) tablet Take 1 tablet by mouth every morning.   nitroGLYCERIN (NITROSTAT) 0.4 MG SL tablet Place 1 tablet (0.4 mg total) under the tongue every 5 (five) minutes as needed for chest pain.   NON FORMULARY CPAP Machine - Use as directed at bedtime.   polyvinyl alcohol (LIQUIFILM TEARS) 1.4 % ophthalmic solution Place 1 drop into both eyes as needed for dry eyes.   pregabalin (LYRICA) 75 MG capsule Take 150 mg by mouth 2 (two) times daily.   venlafaxine XR (EFFEXOR-XR) 150 MG 24  hr capsule TAKE ONE CAPSULE BY MOUTH DAILY FOR MOOD     Allergies:   Penicillins   Social History   Socioeconomic History   Marital status: Married    Spouse name: Not on file   Number of children: 3   Years of education: Not on file   Highest education level: Not on file  Occupational History   Not on file  Tobacco Use   Smoking status: Former    Packs/day: 0.50    Years: 30.00    Total pack years: 15.00    Types: Cigarettes   Smokeless tobacco: Never   Tobacco comments:    2 weeks ago from 06-04-13  Vaping Use   Vaping Use: Never used  Substance and Sexual Activity   Alcohol use: No   Drug use: No   Sexual activity: Yes  Other Topics Concern   Not on file  Social History Narrative   Not on file   Social Determinants of Health   Financial Resource Strain: Not on file  Food Insecurity: Not on file  Transportation Needs: Not on file  Physical Activity: Not on file  Stress: Not on file  Social Connections: Not on file     Family History: The patient's family history includes Colon cancer in his sister; Diabetes in his sister; Heart attack in his brother, brother, father, and mother; Heart disease in his sister.  ROS:   Please see the history of present illness.    Still smokes.  Weight is well controlled.  All other systems reviewed and are negative.  EKGs/Labs/Other Studies Reviewed:    The following studies were reviewed today: No new data  EKG:  EKG normal sinus rhythm, left axis deviation, no acute changes, and no change compared to March 2022.  Recent Labs: No results found for requested labs within last 365 days.  Recent Lipid Panel    Component Value Date/Time   CHOL 132 10/23/2020 0741   CHOL 137 06/12/2017 0911   TRIG 68 10/23/2020 0741   HDL 41 10/23/2020 0741   HDL 37 (L) 06/12/2017 0911   CHOLHDL 3.2 10/23/2020 0741   VLDL 14 10/23/2020 0741   LDLCALC 77 10/23/2020 0741   LDLCALC 85 06/12/2017 0911    Physical Exam:    VS:  BP  138/74   Pulse 76   Ht '5\' 8"'$  (1.727 m)   Wt 159 lb 3.2 oz (72.2 kg)   SpO2 99%   BMI 24.21 kg/m     Wt Readings from Last 3 Encounters:  02/24/22 159 lb 3.2 oz (72.2 kg)  01/19/21 171 lb 9.6 oz (77.8 kg)  11/11/20 172 lb 9.6 oz (78.3 kg)     GEN: Healthy appearing with excellent weight for height.. No acute distress HEENT: Normal NECK: No JVD. LYMPHATICS: No lymphadenopathy CARDIAC: No murmur. RRR no gallop, or edema. VASCULAR:  Normal  Pulses. No bruits. RESPIRATORY:  Clear to auscultation without rales, wheezing or rhonchi  ABDOMEN: Soft, non-tender, non-distended, No pulsatile mass, MUSCULOSKELETAL: No deformity  SKIN: Warm and dry NEUROLOGIC:  Alert and oriented x 3 PSYCHIATRIC:  Normal affect   ASSESSMENT:    1. Coronary artery disease involving native coronary artery of native heart without angina pectoris   2. Obstructive sleep apnea   3. Essential hypertension, benign   4. H/O: CVA (cerebrovascular accident)   5. Stage 3b chronic kidney disease ([place])   6. Pure hypercholesterolemia   7. Tobacco use    PLAN:    In order of problems listed above:  Secondary prevention reiterated.  Encouraged him strongly to discontinue smoking.  Discussed the importance of CPAP compliance and the negative cardiovascular implications of untreated sleep apnea.  In our discussion, chronic sleep deprivation started in early 2000's making it possible that obstructive sleep apnea in addition to hyperlipidemia, tobacco use, and blood pressure played a role in his cardiovascular incident and 2014.  Continue Plavix. Compliant with CPAP.  History suggests that he had sleep apnea in the early 2000's.  Was not evaluated until 2019. Blood pressures well controlled.  Continue Toprol-XL Imdur, low-salt diet. Continue Plavix and aspirin Excellent blood pressure control LDL target less than 70.  Most recent LDL in May was 85.  Target is less than 70.  Continue Zetia 10 mg/day and atorvastatin 80  mg/day.  Decrease saturated fat in his diet. We spent reasonable time discussing the importance of tobacco cessation.  He says I got his attention and he will try to quit.  Overall education and awareness concerning secondary risk prevention was discussed in detail: LDL less than 70, hemoglobin A1c less than 7, blood pressure target less than 130/80 mmHg, >150 minutes of moderate aerobic activity per week, avoidance of smoking, weight control (via diet and exercise), and continued surveillance/management of/for obstructive sleep apnea.    Medication Adjustments/Labs and Tests Ordered: Current medicines are reviewed at length with the patient today.  Concerns regarding medicines are outlined above.  Orders Placed This Encounter  Procedures   EKG 12-Lead   No orders of the defined types were placed in this encounter.   Patient Instructions  Medication Instructions:  Your physician recommends that you continue on your current medications as directed. Please refer to the Current Medication list given to you today.  *If you need a refill on your cardiac medications before your next appointment, please call your pharmacy*  Lab Work: NONE  Testing/Procedures: NONE  Follow-Up: At [facility], you and your health needs are our priority.  As part of our continuing mission to provide you with exceptional heart care, we have created designated Provider Care Teams.  These Care Teams include your primary Cardiologist (physician) and Advanced Practice Providers (APPs -  Physician Assistants and Nurse Practitioners) who all work together to provide you with the care you need, when you need it.  Your next appointment:   1 year(s)  The format for your next appointment:   In Person  Provider:   Sinclair Grooms, MD {  Other Instructions Dr. [name] recommends for you to quit smoking and keep walking for your heart health.  Important Information About Sugar         Signed, Sinclair Grooms, MD  02/24/2022 4:57 PM    [facility]

## 2022-02-24 NOTE — Patient Instructions (Signed)
Medication Instructions:  Your physician recommends that you continue on your current medications as directed. Please refer to the Current Medication list given to you today.  *If you need a refill on your cardiac medications before your next appointment, please call your pharmacy*  Lab Work: NONE  Testing/Procedures: NONE  Follow-Up: At [facility], you and your health needs are our priority.  As part of our continuing mission to provide you with exceptional heart care, we have created designated Provider Care Teams.  These Care Teams include your primary Cardiologist (physician) and Advanced Practice Providers (APPs -  Physician Assistants and Nurse Practitioners) who all work together to provide you with the care you need, when you need it.  Your next appointment:   1 year(s)  The format for your next appointment:   In Person  Provider:   Sinclair Grooms, MD {  Other Instructions Dr. [name] recommends for you to quit smoking and keep walking for your heart health.  Important Information About Sugar

## 2022-03-29 ENCOUNTER — Ambulatory Visit: Payer: Medicare Other | Admitting: Interventional Cardiology

## 2022-06-29 DIAGNOSIS — I252 Old myocardial infarction: Secondary | ICD-10-CM | POA: Diagnosis not present

## 2022-06-29 DIAGNOSIS — Z86010 Personal history of colonic polyps: Secondary | ICD-10-CM | POA: Diagnosis not present

## 2022-06-29 DIAGNOSIS — Z800 Family history of malignant neoplasm of digestive organs: Secondary | ICD-10-CM | POA: Diagnosis not present

## 2022-06-29 DIAGNOSIS — Z7901 Long term (current) use of anticoagulants: Secondary | ICD-10-CM | POA: Diagnosis not present

## 2022-06-30 ENCOUNTER — Telehealth: Payer: Self-pay

## 2022-06-30 NOTE — Telephone Encounter (Signed)
  Patient Consent for Virtual Visit        Daniel Cox has provided verbal consent on 06/30/2022 for a virtual visit (video or telephone).   CONSENT FOR VIRTUAL VISIT FOR:  Daniel Cox  By participating in this virtual visit I agree to the following:  I hereby voluntarily request, consent and authorize [facility] and its employed or contracted physicians, physician assistants, nurse practitioners or other licensed health care professionals (the Practitioner), to provide me with telemedicine health care services (the "Services") as deemed necessary by the treating Practitioner. I acknowledge and consent to receive the Services by the Practitioner via telemedicine. I understand that the telemedicine visit will involve communicating with the Practitioner through live audiovisual communication technology and the disclosure of certain medical information by electronic transmission. I acknowledge that I have been given the opportunity to request an in-person assessment or other available alternative prior to the telemedicine visit and am voluntarily participating in the telemedicine visit.  I understand that I have the right to withhold or withdraw my consent to the use of telemedicine in the course of my care at any time, without affecting my right to future care or treatment, and that the Practitioner or I may terminate the telemedicine visit at any time. I understand that I have the right to inspect all information obtained and/or recorded in the course of the telemedicine visit and may receive copies of available information for a reasonable fee.  I understand that some of the potential risks of receiving the Services via telemedicine include:  Delay or interruption in medical evaluation due to technological equipment failure or disruption; Information transmitted may not be sufficient (e.g. poor resolution of images) to allow for appropriate medical decision making by the  Practitioner; and/or  In rare instances, security protocols could fail, causing a breach of personal health information.  Furthermore, I acknowledge that it is my responsibility to provide information about my medical history, conditions and care that is complete and accurate to the best of my ability. I acknowledge that Practitioner's advice, recommendations, and/or decision may be based on factors not within their control, such as incomplete or inaccurate data provided by me or distortions of diagnostic images or specimens that may result from electronic transmissions. I understand that the practice of medicine is not an exact science and that Practitioner makes no warranties or guarantees regarding treatment outcomes. I acknowledge that a copy of this consent can be made available to me via my patient portal ([facility]), or I can request a printed copy by calling the office of [facility].    I understand that my insurance will be billed for this visit.   I have read or had this consent read to me. I understand the contents of this consent, which adequately explains the benefits and risks of the Services being provided via telemedicine.  I have been provided ample opportunity to ask questions regarding this consent and the Services and have had my questions answered to my satisfaction. I give my informed consent for the services to be provided through the use of telemedicine in my medical care

## 2022-06-30 NOTE — Telephone Encounter (Signed)
Pt scheduled for tele visit on 07/13/22. Med rec and consent done

## 2022-06-30 NOTE — Telephone Encounter (Signed)
This encounter was created in error - please disregard.

## 2022-06-30 NOTE — Telephone Encounter (Signed)
   Name: Daniel Cox  DOB: Apr 23, 1946  MRN: [id number]  Primary Cardiologist: Sinclair Grooms, MD   Preoperative team, please contact this patient and set up a phone call appointment for further preoperative risk assessment. Please obtain consent and complete medication review. Thank you for your help.  I confirm that guidance regarding antiplatelet and oral anticoagulation therapy has been completed and, if necessary, noted below.  Per office protocol, if patient is without any new symptoms or concerns at the time of their virtual visit, he/she may hold Plavix for 5 days prior to procedure. Please resume Plavix as soon as possible postprocedure, at the discretion of the surgeon. Regarding ASA therapy, we recommend continuation of ASA throughout the perioperative period.    Lenna Sciara, NP 06/30/2022, 12:07 PM [place]

## 2022-06-30 NOTE — Telephone Encounter (Signed)
   Pre-operative Risk Assessment    Patient Name: Daniel Cox  DOB: [birth year] MRN: [id number]     Request for Surgical Clearance    Procedure:  Colonoscopy   Date of Surgery:  Clearance 08/02/22 For convenience, highlight and copy (CTL+C) the Clearance MM/DD/YY phrase above. Click here to go to Reason for Visit.  Paste (CTL+V) the date.  Merchandiser, retail.  Then click button underneath called Add Clearance MM/DD/YY as free text.     :1}    Surgeon:  Dr. Alessandra Bevels Surgeon's Group or Practice Name:  [facility]  Phone number:  [phone] Fax number:  [phone]   Type of Clearance Requested:   - Pharmacy:  Hold Clopidogrel (Plavix)     Type of Anesthesia:  Not Indicated   Additional requests/questions:    Oneal Grout   06/30/2022, 7:54 AM

## 2022-07-13 ENCOUNTER — Ambulatory Visit: Payer: Medicare Other | Attending: Cardiology | Admitting: Nurse Practitioner

## 2022-07-13 DIAGNOSIS — Z01810 Encounter for preprocedural cardiovascular examination: Secondary | ICD-10-CM | POA: Diagnosis not present

## 2022-07-13 NOTE — Progress Notes (Signed)
Virtual Visit via Telephone Note   Because of Daniel Cox's co-morbid illnesses, he is at least at moderate risk for complications without adequate follow up.  This format is felt to be most appropriate for this patient at this time.  The patient did not have access to video technology/had technical difficulties with video requiring transitioning to audio format only (telephone).  All issues noted in this document were discussed and addressed.  No physical exam could be performed with this format.  Please refer to the patient's chart for his consent to telehealth for [facility].  Evaluation Performed:  Preoperative cardiovascular risk assessment _____________   Date:  07/13/2022   Patient ID:  Daniel Cox, DOB 11-Oct-1945, MRN [id number] Patient Location:  Home Provider location:   Office  Primary Care Provider:  Mayra Neer, MD Primary Cardiologist:  Sinclair Grooms, MD  Chief Complaint / Patient Profile   76 y.o. y/o male with a h/o CAD s/p NSTEMI/DES-LAD in 2014, hypertension, hyperlipidemia, CVA, CKD stage IIIb, OSA, and tobacco use who is pending colonoscopy on 08/02/2022 with Dr. Alessandra Bevels of [facility] and presents today for telephonic preoperative cardiovascular risk assessment.  Past Medical History    Past Medical History:  Diagnosis Date   Chronic kidney disease    Degenerative disc disease, cervical    Degenerative lumbar disc    Erectile dysfunction    Hyperlipidemia    Past Surgical History:  Procedure Laterality Date   Appendectony     COLONOSCOPY WITH PROPOFOL N/A 08/16/2015   Procedure: COLONOSCOPY WITH PROPOFOL;  Surgeon: Garlan Fair, MD;  Location: [facility];  Service: Endoscopy;  Laterality: N/A;   CORONARY/GRAFT ACUTE MI REVASCULARIZATION N/A 10/23/2020   Procedure: Coronary/Graft Acute MI Revascularization;  Surgeon: Lorretta Harp, MD;  Location: [facility];  Service: Cardiovascular;  Laterality: N/A;   LEFT  HEART CATH AND CORONARY ANGIOGRAPHY N/A 10/23/2020   Procedure: LEFT HEART CATH AND CORONARY ANGIOGRAPHY;  Surgeon: Lorretta Harp, MD;  Location: [facility];  Service: Cardiovascular;  Laterality: N/A;   LEFT HEART CATHETERIZATION WITH CORONARY ANGIOGRAM N/A 02/06/2013   Procedure: LEFT HEART CATHETERIZATION WITH CORONARY ANGIOGRAM;  Surgeon: Sinclair Grooms, MD;  Location: [facility] CATH LAB;  Service: Cardiovascular;  Laterality: N/A;   PERCUTANEOUS CORONARY STENT INTERVENTION (PCI-S) N/A 02/10/2013   Procedure: PERCUTANEOUS CORONARY STENT INTERVENTION (PCI-S);  Surgeon: Sinclair Grooms, MD;  Location: [facility] CATH LAB;  Service: Cardiovascular;  Laterality: N/A;   Right shoulder      Allergies  Allergies  Allergen Reactions   Penicillins Hives, Itching and Rash    Has patient had a PCN reaction causing immediate rash, facial/tongue/throat swelling, SOB or lightheadedness with hypotension: No Has patient had a PCN reaction causing severe rash involving mucus membranes or skin necrosis: No Has patient had a PCN reaction that required hospitalization No Has patient had a PCN reaction occurring within the last 10 years: No If all of the above answers are "NO", then may proceed with Cephalosporin use.     History of Present Illness    [name] is a 76 y.o. male who presents via audio/video conferencing for a telehealth visit today.  Pt was last seen in cardiology clinic on 02/24/2022 by Dr. [name].  At that time JUNIUS FAUCETT was doing well. The patient is now pending procedure as outlined above. Since his last visit, he has done well from a cardiac standpoint.  He denies chest pain, palpitations, dyspnea, pnd, orthopnea, n, v, dizziness, syncope, edema, weight gain, or early satiety. All other systems reviewed and are otherwise negative except as noted above.   Home Medications    Prior to Admission medications   Medication Sig Start Date End Date Taking? Authorizing Provider   aspirin EC 81 MG tablet Take 1 tablet (81 mg total) by mouth daily. 04/17/17   Belva Crome, MD  atorvastatin (LIPITOR) 80 MG tablet Take 80 mg by mouth daily.    [provider]  cetirizine (ZYRTEC) 10 MG tablet Take 10 mg by mouth daily.    [provider]  cholecalciferol (VITAMIN D) 1000 UNITS tablet Take 1,000 Units by mouth every morning.    [provider]  clopidogrel (PLAVIX) 75 MG tablet TAKE 1 TABLET BY MOUTH DAILY 02/14/22   Belva Crome, MD  docusate sodium (COLACE) 100 MG capsule Take 100 mg by mouth daily.    [provider]  ezetimibe (ZETIA) 10 MG tablet Take 10 mg by mouth daily. 01/27/22   [provider]  isosorbide mononitrate (IMDUR) 30 MG 24 hr tablet TAKE 1 TABLET BY MOUTH DAILY 02/14/22   Belva Crome, MD  metoprolol succinate (TOPROL-XL) 25 MG 24 hr tablet TAKE 1 TABLET BY MOUTH DAILY 02/14/22   Belva Crome, MD  Multiple Vitamins-Minerals (MULTIVITAMIN WITH MINERALS) tablet Take 1 tablet by mouth every morning.    [provider]  nitroGLYCERIN (NITROSTAT) 0.4 MG SL tablet Place 1 tablet (0.4 mg total) under the tongue every 5 (five) minutes as needed for chest pain. 01/25/21   Belva Crome, MD  NON FORMULARY CPAP Machine - Use as directed at bedtime.    [provider]  polyvinyl alcohol (LIQUIFILM TEARS) 1.4 % ophthalmic solution Place 1 drop into both eyes as needed for dry eyes.    [provider]  pregabalin (LYRICA) 75 MG capsule Take 150 mg by mouth 2 (two) times daily. 06/20/19   [provider]  venlafaxine XR (EFFEXOR-XR) 150 MG 24 hr capsule TAKE ONE CAPSULE BY MOUTH DAILY FOR MOOD 12/28/21   [provider]    Physical Exam    Vital Signs:  Keith Rake does not have vital signs available for review today.  Given telephonic nature of communication, physical exam is limited. AAOx3. NAD. Normal affect.  Speech and respirations are unlabored.  Accessory Clinical  Findings    None  Assessment & Plan    1.  Preoperative Cardiovascular Risk Assessment:  According to the Revised Cardiac Risk Index (RCRI), his Perioperative Risk of Major Cardiac Event is (%): 6.6. His Functional Capacity in METs is: 7.99 according to the [facility] Activity Status Index (DASI). Therefore, based on ACC/AHA guidelines, patient would be at acceptable risk for the planned procedure without further cardiovascular testing.  The patient was advised that if he develops new symptoms prior to surgery to contact our office to arrange for a follow-up visit, and he verbalized understanding.  Per office protocol, he may hold Plavix for 5 days prior to procedure. Please resume Plavix as soon as possible postprocedure, at the discretion of the surgeon. Regarding ASA therapy, we recommend continuation of ASA throughout the perioperative period.     A copy of this note will be routed to requesting surgeon.  Time:   Today, I have spent 5 minutes with the patient with telehealth technology discussing medical history, symptoms, and management plan.     Helane Gunther  Muath Hallam, NP  07/13/2022, 3:11 PM

## 2022-07-26 DIAGNOSIS — I1310 Hypertensive heart and chronic kidney disease without heart failure, with stage 1 through stage 4 chronic kidney disease, or unspecified chronic kidney disease: Secondary | ICD-10-CM | POA: Diagnosis not present

## 2022-07-26 DIAGNOSIS — I25119 Atherosclerotic heart disease of native coronary artery with unspecified angina pectoris: Secondary | ICD-10-CM | POA: Diagnosis not present

## 2022-07-26 DIAGNOSIS — N183 Chronic kidney disease, stage 3 unspecified: Secondary | ICD-10-CM | POA: Diagnosis not present

## 2022-07-26 DIAGNOSIS — R7303 Prediabetes: Secondary | ICD-10-CM | POA: Diagnosis not present

## 2022-07-26 DIAGNOSIS — E7800 Pure hypercholesterolemia, unspecified: Secondary | ICD-10-CM | POA: Diagnosis not present

## 2022-08-02 DIAGNOSIS — K648 Other hemorrhoids: Secondary | ICD-10-CM | POA: Diagnosis not present

## 2022-08-02 DIAGNOSIS — Z09 Encounter for follow-up examination after completed treatment for conditions other than malignant neoplasm: Secondary | ICD-10-CM | POA: Diagnosis not present

## 2022-08-02 DIAGNOSIS — D122 Benign neoplasm of ascending colon: Secondary | ICD-10-CM | POA: Diagnosis not present

## 2022-08-02 DIAGNOSIS — Z800 Family history of malignant neoplasm of digestive organs: Secondary | ICD-10-CM | POA: Diagnosis not present

## 2022-08-02 DIAGNOSIS — D123 Benign neoplasm of transverse colon: Secondary | ICD-10-CM | POA: Diagnosis not present

## 2022-08-02 DIAGNOSIS — D175 Benign lipomatous neoplasm of intra-abdominal organs: Secondary | ICD-10-CM | POA: Diagnosis not present

## 2022-08-02 DIAGNOSIS — Z86010 Personal history of colonic polyps: Secondary | ICD-10-CM | POA: Diagnosis not present

## 2022-08-04 DIAGNOSIS — D123 Benign neoplasm of transverse colon: Secondary | ICD-10-CM | POA: Diagnosis not present

## 2022-08-04 DIAGNOSIS — D122 Benign neoplasm of ascending colon: Secondary | ICD-10-CM | POA: Diagnosis not present

## 2023-02-18 NOTE — Progress Notes (Unsigned)
Office Visit    Patient Name: Daniel Cox Date of Encounter: 02/18/2023  Primary Care Provider:  Lupita Raider, MD Primary Cardiologist:  Lesleigh Noe, MD (Inactive) Primary Electrophysiologist: None   Past Medical History    Past Medical History:  Diagnosis Date   Chronic kidney disease    Degenerative disc disease, cervical    Degenerative lumbar disc    Erectile dysfunction    Hyperlipidemia    Past Surgical History:  Procedure Laterality Date   Appendectony     COLONOSCOPY WITH PROPOFOL N/A 08/16/2015   Procedure: COLONOSCOPY WITH PROPOFOL;  Surgeon: Charolett Bumpers, MD;  Location: [facility] ENDOSCOPY;  Service: Endoscopy;  Laterality: N/A;   CORONARY/GRAFT ACUTE MI REVASCULARIZATION N/A 10/23/2020   Procedure: Coronary/Graft Acute MI Revascularization;  Surgeon: Runell Gess, MD;  Location: [facility] INVASIVE CV LAB;  Service: Cardiovascular;  Laterality: N/A;   LEFT HEART CATH AND CORONARY ANGIOGRAPHY N/A 10/23/2020   Procedure: LEFT HEART CATH AND CORONARY ANGIOGRAPHY;  Surgeon: Runell Gess, MD;  Location: [facility] INVASIVE CV LAB;  Service: Cardiovascular;  Laterality: N/A;   LEFT HEART CATHETERIZATION WITH CORONARY ANGIOGRAM N/A 02/06/2013   Procedure: LEFT HEART CATHETERIZATION WITH CORONARY ANGIOGRAM;  Surgeon: Lesleigh Noe, MD;  Location: [facility] CATH LAB;  Service: Cardiovascular;  Laterality: N/A;   PERCUTANEOUS CORONARY STENT INTERVENTION (PCI-S) N/A 02/10/2013   Procedure: PERCUTANEOUS CORONARY STENT INTERVENTION (PCI-S);  Surgeon: Lesleigh Noe, MD;  Location: [facility] CATH LAB;  Service: Cardiovascular;  Laterality: N/A;   Right shoulder      Allergies  Allergies  Allergen Reactions   Penicillins Hives, Itching and Rash    Has patient had a PCN reaction causing immediate rash, facial/tongue/throat swelling, SOB or lightheadedness with hypotension: No Has patient had a PCN reaction causing severe rash involving mucus membranes or skin necrosis: No Has  patient had a PCN reaction that required hospitalization No Has patient had a PCN reaction occurring within the last 10 years: No If all of the above answers are "NO", then may proceed with Cephalosporin use.      History of Present Illness    Daniel Cox  is a 77 year old male with a PMH of CAD s/p NSTEMI/DES-LAD in 2014, hypertension, LVH, hyperlipidemia, embolic CVA, CKD stage IIIb, OSA on CPAP, prediabetes, and tobacco use who presents today for 1 year follow-up.  Mr. Lizer was originally followed by Dr. Katrinka Blazing for management of coronary artery disease.  He suffered a NSTEMI 2014 that was treated with DES to LAD.  During his procedure he developed aphasia and was found to have embolic CVA that did not require tPA.  He presented to the ED 10/23/2020 with STEMI and underwent LHC high-grade OM 2 stenosis with unsuccessful PCI attempt due to severe tortuosity.  He was noted to have 75% mid RCA disease, 80% distal RCA disease, widely patent LAD stent, 90% D1, 75% ostial left circumflex lesion, 85% OM1 lesion.  He was treated with aggressive medical therapy.  2D echo was completed showing normal EF and patient was discharged with Brilinta and aspirin.  He was last seen by Dr. Katrinka Blazing on 02/24/2022 for annual follow-up.  During visit patient was doing well with no cardiac complaints noted.  He was remaining active doing yard work but continues to smoke.  He was strongly encouraged to stop smoking.  Blood pressure was noted to be well-controlled and LDL was at target.  Since last being seen in the  office patient reports***.  Patient denies chest pain, palpitations, dyspnea, PND, orthopnea, nausea, vomiting, dizziness, syncope, edema, weight gain, or early satiety.     ***Notes:  Home Medications    Current Outpatient Medications  Medication Sig Dispense Refill   aspirin EC 81 MG tablet Take 1 tablet (81 mg total) by mouth daily. 90 tablet 3   atorvastatin (LIPITOR) 80 MG tablet Take 80 mg by  mouth daily.     cetirizine (ZYRTEC) 10 MG tablet Take 10 mg by mouth daily.     cholecalciferol (VITAMIN D) 1000 UNITS tablet Take 1,000 Units by mouth every morning.     clopidogrel (PLAVIX) 75 MG tablet TAKE 1 TABLET BY MOUTH DAILY 90 tablet 0   docusate sodium (COLACE) 100 MG capsule Take 100 mg by mouth daily.     ezetimibe (ZETIA) 10 MG tablet Take 10 mg by mouth daily.     isosorbide mononitrate (IMDUR) 30 MG 24 hr tablet TAKE 1 TABLET BY MOUTH DAILY 90 tablet 0   metoprolol succinate (TOPROL-XL) 25 MG 24 hr tablet TAKE 1 TABLET BY MOUTH DAILY 90 tablet 0   Multiple Vitamins-Minerals (MULTIVITAMIN WITH MINERALS) tablet Take 1 tablet by mouth every morning.     nitroGLYCERIN (NITROSTAT) 0.4 MG SL tablet Place 1 tablet (0.4 mg total) under the tongue every 5 (five) minutes as needed for chest pain. 25 tablet 7   NON FORMULARY CPAP Machine - Use as directed at bedtime.     polyvinyl alcohol (LIQUIFILM TEARS) 1.4 % ophthalmic solution Place 1 drop into both eyes as needed for dry eyes.     pregabalin (LYRICA) 75 MG capsule Take 150 mg by mouth 2 (two) times daily.     venlafaxine XR (EFFEXOR-XR) 150 MG 24 hr capsule TAKE ONE CAPSULE BY MOUTH DAILY FOR MOOD     No current facility-administered medications for this visit.     Review of Systems  Please see the history of present illness.    (+)*** (+)***  All other systems reviewed and are otherwise negative except as noted above.  Physical Exam    Wt Readings from Last 3 Encounters:  02/24/22 159 lb 3.2 oz (72.2 kg)  01/19/21 171 lb 9.6 oz (77.8 kg)  11/11/20 172 lb 9.6 oz (78.3 kg)   IR:JJOAC were no vitals filed for this visit.,There is no height or weight on file to calculate BMI.  Constitutional:      Appearance: Healthy appearance. Not in distress.  Neck:     Vascular: JVD normal.  Pulmonary:     Effort: Pulmonary effort is normal.     Breath sounds: No wheezing. No rales. Diminished in the bases Cardiovascular:      Normal rate. Regular rhythm. Normal S1. Normal S2.      Murmurs: There is no murmur.  Edema:    Peripheral edema absent.  Abdominal:     Palpations: Abdomen is soft non tender. There is no hepatomegaly.  Skin:    General: Skin is warm and dry.  Neurological:     General: No focal deficit present.     Mental Status: Alert and oriented to person, place and time.     Cranial Nerves: Cranial nerves are intact.  EKG/LABS/ Recent Cardiac Studies    ECG personally reviewed by me today - ***  Cardiac Studies & Procedures   CARDIAC CATHETERIZATION  CARDIAC CATHETERIZATION 10/23/2020  Narrative Images from the original result were not included.   Mid RCA lesion is 75% stenosed.  Dist RCA lesion is 80% stenosed.  Previously placed Prox LAD to Mid LAD stent (unknown type) is widely patent.  1st Diag lesion is 90% stenosed.  Ost Cx to Prox Cx lesion is 75% stenosed.  1st Mrg lesion is 85% stenosed.  2nd Mrg lesion is 99% stenosed.  JASPREET HOLLINGS is a 77 y.o. male   [id number] LOCATION:  FACILITY: [facility] PHYSICIAN: Nanetta Batty, M.D. 02-25-1946   DATE OF PROCEDURE:  10/23/2020  DATE OF DISCHARGE:     CARDIAC CATHETERIZATION / Attempt at PCI OM2    History obtained from chart review Mr. Linda Hedges is a 77 year old married African-American male patient of Dr. Michaelle Copas with a history of PCI and drug-eluting stenting of his LAD back in 02/06/2013.  4 days prior he had attempt at PCI which was complicated by a CNS event.  He initially try to go radially but because of radial spasm and tortuosity he ultimately went through the femoral approach.  He has not been seen by Dr. Katrinka Blazing for several years. .Mr. Toney was in usual state of health up until 5:30 AM when he woke up from sleep with substernal chest pressure radiating to his left shoulder.  It was 6 out of 10.  No associated diaphoresis, nausea or vomiting.  Pain persisted and he called EMS.  EKG showed lateral ST elevation with  reciprocal ST depression anterior leads.  He was given aspirin 324 mg and code STEMI activated.  Upon arrival to ER his chest pain was improved to 2 out of 10.  He was given heparin bolus.  Shortly after that he was complaining of worsening pain to 4 out of 10 with radiation to his back.  He was given morphine 2 mg.  EKG with improved ST segment.  He was brought to Cath Lab.  Currently smokes less than half a pack a day cigarette.  He played 18 holes of golf yesterday.  Does not chest tightness or pressure.  Denies sick contact.  No fever, chills, cough, congestion, palpitation, dizziness, orthopnea, PND or syncope.   PROCEDURE DESCRIPTION:  The patient was brought to the second floor [facility] cath lab in the postabsorptive state. He was not premedicated . His right groin was prepped and shaved in usual sterile fashion. Xylocaine 1% was used for local anesthesia. A 6 French sheath was inserted into the right common femoral artery using standard Seldinger technique.  Ultrasound was used to guide access.  A digital image was captured and placed the patient's chart.  5 French right left second sinus and catheters on the 5 French pigtail catheter used for selective coronary angiography and obtain left heart pressures.  Isovue dye was used for the entirety of the case (170 cc total delivered to patient).  Retroaortic, ventricular and pullback pressures were recorded.  LVEDP was measured at 25.  It appeared that OM 2 was the "culprit vessel" with TIMI 0-I flow.  In addition, he had high-grade proximal AV groove circumflex stenosis and ostial/proximal OM1 stenosis as well as first diagonal branch stenosis.  He also had distal RCA stenosis.  His EKG was consistent with inferolateral STEMI.  The patient received an additional 6 units of heparin plus additional 3000 as of heparin (9000 units total) with an ACT of 267.  Isovue-East for the entirety of the intervention.  Retroaortic pressures monitored  during the case.  Using a 6 [place] XB 3.5 cm guide catheter along with a 0.14 [place] guidewire and a 0.14 cougar guidewire I  was able to wire the circumflex but was unable to select the second obtuse marginal branch despite multiple attempts.  At the beginning of the case the OM had TIMI 0-I flow.  At the end of the case and had TIMI II-III flow with a high-grade somewhat hazy appearing ostial stenosis.  Because of the angulation and tortuosity of the vessel I do not think it is technically suitable for PCI.  The patient was pain-free at the end of the case.  Impression Unsuccessful attempted PCI of high-grade ostial second obtuse marginal branch stenosis secondary to severe tortuosity.  All catheter exchanges were done over a long wire in the ascending arch given the fact that he had a CNS embolic event during his previous procedure 8 years ago.  A right common femoral angiogram was performed and a Mynx closure device successfully deployed.  The patient left lab in stable condition.  Heparin will be restarted for 48 hours 4 hours after sheath removal without a bolus.  His ACS event will be treated medically.  He left the lab in stable condition.  Nanetta Batty. MD, [facility] 10/23/2020 9:39 AM  Findings Coronary Findings Diagnostic  Dominance: Right  Left Anterior Descending Previously placed Prox LAD to Mid LAD stent (unknown type) is widely patent.  First Diagonal Branch 1st Diag lesion is 90% stenosed.  Left Circumflex Ost Cx to Prox Cx lesion is 75% stenosed.  First Obtuse Marginal Branch 1st Mrg lesion is 85% stenosed.  Second Obtuse Marginal Branch 2nd Mrg lesion is 99% stenosed. Vessel is the culprit lesion. The lesion is thrombotic.  Right Coronary Artery Mid RCA lesion is 75% stenosed. Dist RCA lesion is 80% stenosed.  Intervention  No interventions have been documented.   STRESS TESTS  MYOCARDIAL PERFUSION IMAGING 04/25/2017  Narrative  The patient walked for 9:00  of a Bruce protocol GXT. Peak HR of 133 which is 90% predicted maximal HR . BP response to exercise was normal.  There were no ST or T wave changes to suggest ischemia .  Defect 1: There is a small defect of moderate severity present in the apical lateral and apex location.  The LV EF is 51%. The left ventricular ejection fraction is mildly decreased (45-54%).  This is a low risk study.   ECHOCARDIOGRAM  ECHOCARDIOGRAM COMPLETE 10/23/2020  Narrative ECHOCARDIOGRAM REPORT    Patient Name:   CORYDON SCHWEISS Date of Exam: 10/23/2020 Medical Rec #:  [id number]        Height:       68.0 in Accession #:    [id number]       Weight:       174.0 lb Date of Birth:  1946/04/27         BSA:          1.926 m Patient Age:    74 years         BP:           148/100 mmHg Patient Gender: M                HR:           54 bpm. Exam Location:  Inpatient  Procedure: 2D Echo  Indications:    cad of native vessel  History:        Patient has prior history of Echocardiogram examinations, most recent 02/07/2013. CAD; Risk Factors:Hypertension, Dyslipidemia and Current Smoker.  Sonographer:    Delcie Roch Referring Phys: [phone] JONATHAN J BERRY  IMPRESSIONS  1. Left ventricular ejection fraction, by estimation, is 60 to 65%. The left ventricle has normal function. The left ventricle has no regional wall motion abnormalities. Left ventricular diastolic parameters were normal. 2. Right ventricular systolic function is normal. The right ventricular size is normal. 3. The mitral valve is normal in structure. Trivial mitral valve regurgitation. No evidence of mitral stenosis. 4. The aortic valve is tricuspid. Aortic valve regurgitation is not visualized. Mild aortic valve sclerosis is present, with no evidence of aortic valve stenosis. 5. Aortic dilatation noted. There is borderline dilatation of the aortic root, measuring 39 mm. 6. The inferior vena cava is normal in size with greater than 50%  respiratory variability, suggesting right atrial pressure of 3 mmHg.  FINDINGS Left Ventricle: Left ventricular ejection fraction, by estimation, is 60 to 65%. The left ventricle has normal function. The left ventricle has no regional wall motion abnormalities. The left ventricular internal cavity size was normal in size. There is no left ventricular hypertrophy. Left ventricular diastolic parameters were normal.  Right Ventricle: The right ventricular size is normal.Right ventricular systolic function is normal.  Left Atrium: Left atrial size was normal in size.  Right Atrium: Right atrial size was normal in size.  Pericardium: There is no evidence of pericardial effusion.  Mitral Valve: The mitral valve is normal in structure. Trivial mitral valve regurgitation. No evidence of mitral valve stenosis.  Tricuspid Valve: The tricuspid valve is normal in structure. Tricuspid valve regurgitation is trivial. No evidence of tricuspid stenosis.  Aortic Valve: The aortic valve is tricuspid. Aortic valve regurgitation is not visualized. Mild aortic valve sclerosis is present, with no evidence of aortic valve stenosis.  Pulmonic Valve: The pulmonic valve was normal in structure. Pulmonic valve regurgitation is not visualized. No evidence of pulmonic stenosis.  Aorta: Aortic dilatation noted. There is borderline dilatation of the aortic root, measuring 39 mm.  Venous: The inferior vena cava is normal in size with greater than 50% respiratory variability, suggesting right atrial pressure of 3 mmHg.  IAS/Shunts: No atrial level shunt detected by color flow Doppler.   LEFT VENTRICLE PLAX 2D LVIDd:         4.70 cm  Diastology LVIDs:         3.00 cm  LV e' medial:    9.68 cm/s LV PW:         1.00 cm  LV E/e' medial:  8.1 LV IVS:        1.00 cm  LV e' lateral:   10.30 cm/s LVOT diam:     2.30 cm  LV E/e' lateral: 7.6 LV SV:         101 LV SV Index:   52 LVOT Area:     4.15 cm   RIGHT  VENTRICLE             IVC RV S prime:     13.50 cm/s  IVC diam: 1.50 cm TAPSE (M-mode): 2.1 cm  LEFT ATRIUM             Index       RIGHT ATRIUM           Index LA diam:        3.10 cm 1.61 cm/m  RA Area:     13.40 cm LA Vol (A2C):   41.0 ml 21.28 ml/m RA Volume:   32.50 ml  16.87 ml/m LA Vol (A4C):   45.3 ml 23.52 ml/m LA Biplane Vol: 45.0 ml 23.36 ml/m AORTIC VALVE  LVOT Vmax:   102.00 cm/s LVOT Vmean:  63.300 cm/s LVOT VTI:    0.242 m  AORTA Ao Root diam: 3.80 cm Ao Asc diam:  3.90 cm  MITRAL VALVE MV Area (PHT): 2.06 cm    SHUNTS MV Decel Time: 369 msec    Systemic VTI:  0.24 m MV E velocity: 78.00 cm/s  Systemic Diam: 2.30 cm MV A velocity: 64.70 cm/s MV E/A ratio:  1.21  Olga Millers MD Electronically signed by Olga Millers MD Signature Date/Time: 10/23/2020/2:15:45 PM    Final             Risk Assessment/Calculations:   {Does this patient have ATRIAL FIBRILLATION?[phone]}        Lab Results  Component Value Date   WBC 5.4 10/26/2020   HGB 11.4 (L) 10/26/2020   HCT 33.5 (L) 10/26/2020   MCV 99.4 10/26/2020   PLT 129 (L) 10/26/2020   Lab Results  Component Value Date   CREATININE 1.49 (H) 11/11/2020   BUN 18 11/11/2020   NA 140 11/11/2020   K 4.0 11/11/2020   CL 104 11/11/2020   CO2 23 11/11/2020   Lab Results  Component Value Date   ALT 19 10/23/2020   AST 32 10/23/2020   ALKPHOS 94 10/23/2020   BILITOT 0.7 10/23/2020   Lab Results  Component Value Date   CHOL 132 10/23/2020   HDL 41 10/23/2020   LDLCALC 77 10/23/2020   TRIG 68 10/23/2020   CHOLHDL 3.2 10/23/2020    Lab Results  Component Value Date   HGBA1C 6.2 (H) 10/23/2020     Assessment & Plan    1.  History of CAD:  2.  Essential hypertension:  3.  Hyperlipidemia:  4.  History of CVA:  5.  CKD stage IIIb:  6.  Tobacco abuse:      Disposition: Follow-up with Lesleigh Noe, MD (Inactive) or APP in *** months {Are you ordering a CV Procedure  (e.g. stress test, cath, DCCV, TEE, etc)?   Press F2        :[id number]}   Medication Adjustments/Labs and Tests Ordered: Current medicines are reviewed at length with the patient today.  Concerns regarding medicines are outlined above.   Signed, Napoleon Form, Leodis Rains, NP 02/18/2023, 2:10 PM [facility]

## 2023-02-19 ENCOUNTER — Ambulatory Visit: Payer: Medicare Other | Attending: Nurse Practitioner | Admitting: Nurse Practitioner

## 2023-02-19 ENCOUNTER — Encounter: Payer: Self-pay | Admitting: Nurse Practitioner

## 2023-02-19 VITALS — BP 114/68 | HR 60 | Ht 68.0 in | Wt 160.0 lb

## 2023-02-19 DIAGNOSIS — E785 Hyperlipidemia, unspecified: Secondary | ICD-10-CM | POA: Diagnosis not present

## 2023-02-19 DIAGNOSIS — I10 Essential (primary) hypertension: Secondary | ICD-10-CM | POA: Diagnosis not present

## 2023-02-19 DIAGNOSIS — I2510 Atherosclerotic heart disease of native coronary artery without angina pectoris: Secondary | ICD-10-CM

## 2023-02-19 DIAGNOSIS — N1832 Chronic kidney disease, stage 3b: Secondary | ICD-10-CM

## 2023-02-19 DIAGNOSIS — Z8673 Personal history of transient ischemic attack (TIA), and cerebral infarction without residual deficits: Secondary | ICD-10-CM

## 2023-02-19 NOTE — Patient Instructions (Signed)
Medication Instructions:  Your physician recommends that you continue on your current medications as directed. Please refer to the Current Medication list given to you today. *If you need a refill on your cardiac medications before your next appointment, please call your pharmacy*   Lab Work: None ordered   Testing/Procedures: None ordered   Follow-Up: At [facility] you and your health needs are our priority.  As part of our continuing mission to provide you with exceptional heart care, we have created designated Provider Care Teams.  These Care Teams include your primary Cardiologist (physician) and Advanced Practice Providers (APPs -  Physician Assistants and Nurse Practitioners) who all work together to provide you with the care you need, when you need it.  We recommend signing up for the patient portal called "MyChart".  Sign up information is provided on this After Visit Summary.  MyChart is used to connect with patients for Virtual Visits (Telemedicine).  Patients are able to view lab/test results, encounter notes, upcoming appointments, etc.  Non-urgent messages can be sent to your provider as well.   To learn more about what you can do with MyChart, go to [email or web address].    Your next appointment:   12 month(s)  Provider:   Runell Gess, MD     Other Instructions

## 2023-06-09 DIAGNOSIS — Z23 Encounter for immunization: Secondary | ICD-10-CM | POA: Diagnosis not present

## 2023-06-20 DIAGNOSIS — I1310 Hypertensive heart and chronic kidney disease without heart failure, with stage 1 through stage 4 chronic kidney disease, or unspecified chronic kidney disease: Secondary | ICD-10-CM | POA: Diagnosis not present

## 2023-06-20 DIAGNOSIS — I77810 Thoracic aortic ectasia: Secondary | ICD-10-CM | POA: Diagnosis not present

## 2023-06-20 DIAGNOSIS — M549 Dorsalgia, unspecified: Secondary | ICD-10-CM | POA: Diagnosis not present

## 2023-06-20 DIAGNOSIS — K219 Gastro-esophageal reflux disease without esophagitis: Secondary | ICD-10-CM | POA: Diagnosis not present

## 2023-06-20 DIAGNOSIS — Z720 Tobacco use: Secondary | ICD-10-CM | POA: Diagnosis not present

## 2023-06-20 DIAGNOSIS — R7303 Prediabetes: Secondary | ICD-10-CM | POA: Diagnosis not present

## 2023-06-20 DIAGNOSIS — I25119 Atherosclerotic heart disease of native coronary artery with unspecified angina pectoris: Secondary | ICD-10-CM | POA: Diagnosis not present

## 2023-06-20 DIAGNOSIS — Z0000 Encounter for general adult medical examination without abnormal findings: Secondary | ICD-10-CM | POA: Diagnosis not present

## 2023-06-20 DIAGNOSIS — N183 Chronic kidney disease, stage 3 unspecified: Secondary | ICD-10-CM | POA: Diagnosis not present

## 2023-06-20 DIAGNOSIS — E7800 Pure hypercholesterolemia, unspecified: Secondary | ICD-10-CM | POA: Diagnosis not present

## 2023-06-20 LAB — LAB REPORT - SCANNED
A1c: 6
Creatinine, POC: 166 mg/dL
EGFR: 52

## 2023-06-21 ENCOUNTER — Encounter: Payer: Self-pay | Admitting: Family Medicine

## 2023-12-19 DIAGNOSIS — Z720 Tobacco use: Secondary | ICD-10-CM | POA: Diagnosis not present

## 2023-12-19 DIAGNOSIS — E7800 Pure hypercholesterolemia, unspecified: Secondary | ICD-10-CM | POA: Diagnosis not present

## 2023-12-19 DIAGNOSIS — I1310 Hypertensive heart and chronic kidney disease without heart failure, with stage 1 through stage 4 chronic kidney disease, or unspecified chronic kidney disease: Secondary | ICD-10-CM | POA: Diagnosis not present

## 2023-12-19 DIAGNOSIS — L309 Dermatitis, unspecified: Secondary | ICD-10-CM | POA: Diagnosis not present

## 2023-12-19 DIAGNOSIS — I25119 Atherosclerotic heart disease of native coronary artery with unspecified angina pectoris: Secondary | ICD-10-CM | POA: Diagnosis not present

## 2023-12-19 DIAGNOSIS — Z23 Encounter for immunization: Secondary | ICD-10-CM | POA: Diagnosis not present

## 2023-12-19 DIAGNOSIS — N183 Chronic kidney disease, stage 3 unspecified: Secondary | ICD-10-CM | POA: Diagnosis not present

## 2023-12-19 DIAGNOSIS — R7303 Prediabetes: Secondary | ICD-10-CM | POA: Diagnosis not present

## 2023-12-19 LAB — LAB REPORT - SCANNED
A1c: 6.2
EGFR: 50

## 2024-01-04 ENCOUNTER — Ambulatory Visit
Admission: RE | Admit: 2024-01-04 | Discharge: 2024-01-04 | Disposition: A | Discharge status: Home / Self Care | Source: Ambulatory Visit | Attending: Family Medicine | Admitting: Family Medicine

## 2024-01-04 ENCOUNTER — Other Ambulatory Visit: Payer: Self-pay | Admitting: Family Medicine

## 2024-01-04 DIAGNOSIS — I723 Aneurysm of iliac artery: Secondary | ICD-10-CM | POA: Diagnosis not present

## 2024-01-04 DIAGNOSIS — R1030 Lower abdominal pain, unspecified: Secondary | ICD-10-CM | POA: Diagnosis not present

## 2024-01-04 DIAGNOSIS — R109 Unspecified abdominal pain: Secondary | ICD-10-CM | POA: Diagnosis not present

## 2024-01-04 DIAGNOSIS — I7143 Infrarenal abdominal aortic aneurysm, without rupture: Secondary | ICD-10-CM | POA: Diagnosis not present

## 2024-01-04 MED ORDER — IOPAMIDOL (ISOVUE-300) INJECTION 61%
100.0000 mL | Freq: Once | INTRAVENOUS | Status: AC | PRN
  Administered 2024-01-04: 100 mL via INTRAVENOUS

## 2024-01-07 ENCOUNTER — Other Ambulatory Visit: Payer: Self-pay | Admitting: Family Medicine

## 2024-01-07 ENCOUNTER — Other Ambulatory Visit: Payer: Self-pay

## 2024-01-07 ENCOUNTER — Emergency Department (HOSPITAL_COMMUNITY)
Admission: EM | Admit: 2024-01-07 | Discharge: 2024-01-07 | Disposition: A | Discharge status: Home / Self Care | Attending: Emergency Medicine | Admitting: Emergency Medicine

## 2024-01-07 DIAGNOSIS — R030 Elevated blood-pressure reading, without diagnosis of hypertension: Secondary | ICD-10-CM | POA: Insufficient documentation

## 2024-01-07 DIAGNOSIS — R109 Unspecified abdominal pain: Secondary | ICD-10-CM

## 2024-01-07 DIAGNOSIS — M549 Dorsalgia, unspecified: Secondary | ICD-10-CM | POA: Insufficient documentation

## 2024-01-07 DIAGNOSIS — Z7982 Long term (current) use of aspirin: Secondary | ICD-10-CM | POA: Insufficient documentation

## 2024-01-07 DIAGNOSIS — Z7901 Long term (current) use of anticoagulants: Secondary | ICD-10-CM | POA: Insufficient documentation

## 2024-01-07 DIAGNOSIS — M545 Low back pain, unspecified: Secondary | ICD-10-CM | POA: Diagnosis not present

## 2024-01-07 DIAGNOSIS — I10 Essential (primary) hypertension: Secondary | ICD-10-CM | POA: Diagnosis not present

## 2024-01-07 LAB — URINALYSIS, ROUTINE W REFLEX MICROSCOPIC
Bacteria, UA: NONE SEEN
Bilirubin Urine: NEGATIVE
Glucose, UA: NEGATIVE mg/dL
Ketones, ur: NEGATIVE mg/dL
Leukocytes,Ua: NEGATIVE
Nitrite: NEGATIVE
Protein, ur: NEGATIVE mg/dL
Specific Gravity, Urine: 1.016 (ref 1.005–1.030)
pH: 5 (ref 5.0–8.0)

## 2024-01-07 LAB — CBC WITH DIFFERENTIAL/PLATELET
Abs Immature Granulocytes: 0.01 10*3/uL (ref 0.00–0.07)
Basophils Absolute: 0 10*3/uL (ref 0.0–0.1)
Basophils Relative: 1 %
Eosinophils Absolute: 0.4 10*3/uL (ref 0.0–0.5)
Eosinophils Relative: 9 %
HCT: 46.1 % (ref 39.0–52.0)
Hemoglobin: 14.8 g/dL (ref 13.0–17.0)
Immature Granulocytes: 0 %
Lymphocytes Relative: 39 %
Lymphs Abs: 2 10*3/uL (ref 0.7–4.0)
MCH: 33.6 pg (ref 26.0–34.0)
MCHC: 32.1 g/dL (ref 30.0–36.0)
MCV: 104.8 fL — ABNORMAL HIGH (ref 80.0–100.0)
Monocytes Absolute: 0.5 10*3/uL (ref 0.1–1.0)
Monocytes Relative: 11 %
Neutro Abs: 2 10*3/uL (ref 1.7–7.7)
Neutrophils Relative %: 40 %
Platelets: 169 10*3/uL (ref 150–400)
RBC: 4.4 MIL/uL (ref 4.22–5.81)
RDW: 12.1 % (ref 11.5–15.5)
WBC: 4.9 10*3/uL (ref 4.0–10.5)
nRBC: 0 % (ref 0.0–0.2)

## 2024-01-07 LAB — LIPASE, BLOOD: Lipase: 57 U/L — ABNORMAL HIGH (ref 11–51)

## 2024-01-07 LAB — COMPREHENSIVE METABOLIC PANEL WITH GFR
ALT: 21 U/L (ref 0–44)
AST: 33 U/L (ref 15–41)
Albumin: 4.5 g/dL (ref 3.5–5.0)
Alkaline Phosphatase: 130 U/L — ABNORMAL HIGH (ref 38–126)
Anion gap: 9 (ref 5–15)
BUN: 16 mg/dL (ref 8–23)
CO2: 27 mmol/L (ref 22–32)
Calcium: 9.3 mg/dL (ref 8.9–10.3)
Chloride: 102 mmol/L (ref 98–111)
Creatinine, Ser: 1.17 mg/dL (ref 0.61–1.24)
GFR, Estimated: >60 mL/min (ref >60)
Glucose, Bld: 98 mg/dL (ref 70–99)
Potassium: 4.1 mmol/L (ref 3.5–5.1)
Sodium: 138 mmol/L (ref 135–145)
Total Bilirubin: 1.1 mg/dL (ref 0.0–1.2)
Total Protein: 8.4 g/dL — ABNORMAL HIGH (ref 6.5–8.1)

## 2024-01-07 NOTE — ED Provider Notes (Signed)
 [facility] EMERGENCY DEPARTMENT AT [facility] Provider Note   CSN: [id number] Arrival date & time: 01/07/24  1053     History  Chief Complaint  Patient presents with   Back Pain    Saw pcp Friday who ordered ct scan and bloodwork, no results yet.    Abdominal Pain    Has lower quadrant abd pain ongoing for two weeks, no n/v/d    Daniel Cox is a 78 y.o. male.  HPI 78 year old male presents with a chief complaint of back pain and abdominal pain.  He has been dealing with this for 2+ weeks.  He saw his PCP and had a CT scan 3 days ago that was overall unrevealing for an acute process.  Due to his continued pain he presented to the ED and then while waiting in the ED discussed with his doctor about the negative results.  The pain primarily is in his mid back diffusely from left to right.  It is not constant but primarily occurs whenever he sits up, bends or twists.  Going from laying to sitting or standing is particularly the worst.  No radiation of the pain down his legs and no incontinence or leg weakness or numbness.  He has not had any fevers.  No vomiting, diarrhea, urinary symptoms.  When he applies the Voltaren gel it seems to help.  Home Medications Prior to Admission medications   Medication Sig Start Date End Date Taking? Authorizing Provider  aspirin  EC 81 MG tablet Take 1 tablet (81 mg total) by mouth daily. 04/17/17   Arty Binning, MD  atorvastatin  (LIPITOR ) 80 MG tablet Take 80 mg by mouth daily.    [provider]  cetirizine (ZYRTEC) 10 MG tablet Take 10 mg by mouth daily.    [provider]  cholecalciferol (VITAMIN D) 1000 UNITS tablet Take 1,000 Units by mouth every morning.    [provider]  clopidogrel  (PLAVIX ) 75 MG tablet TAKE 1 TABLET BY MOUTH DAILY 02/14/22   Arty Binning, MD  docusate sodium (COLACE) 100 MG capsule Take 100 mg by mouth daily.    [provider]  ezetimibe (ZETIA) 10 MG tablet Take 10 mg by  mouth daily. 01/27/22   [provider]  isosorbide  mononitrate (IMDUR ) 30 MG 24 hr tablet TAKE 1 TABLET BY MOUTH DAILY 02/14/22   Arty Binning, MD  metoprolol  succinate (TOPROL -XL) 25 MG 24 hr tablet TAKE 1 TABLET BY MOUTH DAILY 02/14/22   Arty Binning, MD  Multiple Vitamins-Minerals (MULTIVITAMIN WITH MINERALS) tablet Take 1 tablet by mouth every morning.    [provider]  nitroGLYCERIN  (NITROSTAT ) 0.4 MG SL tablet Place 1 tablet (0.4 mg total) under the tongue every 5 (five) minutes as needed for chest pain. 01/25/21   Arty Binning, MD  NON FORMULARY CPAP Machine - Use as directed at bedtime.    [provider]  polyvinyl alcohol (LIQUIFILM TEARS) 1.4 % ophthalmic solution Place 1 drop into both eyes as needed for dry eyes.    [provider]  pregabalin  (LYRICA ) 75 MG capsule Take 150 mg by mouth 2 (two) times daily. 06/20/19   [provider]  venlafaxine XR (EFFEXOR-XR) 150 MG 24 hr capsule Take 150 mg by mouth as needed. TAKE ONE CAPSULE BY MOUTH DAILY FOR MOOD 12/28/21   [provider]      Allergies    Penicillins    Review of Systems   Review of Systems  Constitutional:  Negative for fever.  Respiratory:  Negative for cough and shortness of breath.   Gastrointestinal:  Positive for abdominal pain. Negative for diarrhea and vomiting.  Genitourinary:  Negative for dysuria.  Musculoskeletal:  Positive for back pain.  Neurological:  Negative for weakness and numbness.    Physical Exam Updated Vital Signs BP (!) 193/121   Pulse 99   Temp 97.8 F (36.6 C) (Oral)   Resp 17   SpO2 100%  Physical Exam Vitals and nursing note reviewed.  Constitutional:      Appearance: He is well-developed.  HENT:     Head: Normocephalic and atraumatic.  Cardiovascular:     Rate and Rhythm: Normal rate and regular rhythm.     Heart sounds: Normal heart sounds.  Pulmonary:     Effort: Pulmonary effort is normal.     Breath sounds:  Normal breath sounds.  Abdominal:     Palpations: Abdomen is soft.     Tenderness: There is no abdominal tenderness.  Musculoskeletal:     Thoracic back: No tenderness.     Lumbar back: No tenderness.       Back:  Skin:    General: Skin is warm and dry.  Neurological:     Mental Status: He is alert.     Comments: 5/5 strength in BLE. Grossly normal sensation.     ED Results / Procedures / Treatments   Labs (all labs ordered are listed, but only abnormal results are displayed) Labs Reviewed  CBC WITH DIFFERENTIAL/PLATELET - Abnormal; Notable for the following components:      Result Value   MCV 104.8 (*)    All other components within normal limits  URINALYSIS, ROUTINE W REFLEX MICROSCOPIC - Abnormal; Notable for the following components:   Hgb urine dipstick MODERATE (*)    All other components within normal limits  COMPREHENSIVE METABOLIC PANEL WITH GFR - Abnormal; Notable for the following components:   Total Protein 8.4 (*)    Alkaline Phosphatase 130 (*)    All other components within normal limits  LIPASE, BLOOD - Abnormal; Notable for the following components:   Lipase 57 (*)    All other components within normal limits    EKG None  Radiology No results found.  Procedures Procedures    Medications Ordered in ED Medications - No data to display  ED Course/ Medical Decision Making/ A&P                                 Medical Decision Making Amount and/or Complexity of Data Reviewed Labs:     Details: Normal WBC.  Unremarkable electrolytes.   Patient presents with continued pain.  However his pain is not new or worse or different than when he had a CT a few days ago.  There was noted to be a mild aortic aneurysm but my suspicion that this is rapidly enlarging or getting worse is pretty low.  In fact, he is essentially asymptomatic when I am examining him.  Exam is reassuring.  He is hypertensive though asymptomatic from this.  He also states he forgot to  take his blood pressure medicine today because he was coming here.  Ultimately he prefers discharge since he is now asymptomatic and wants to follow-up with his PCP to discuss results and further workup.  He does have some blood in his urine which can be followed up as an outpatient.  Otherwise  he is well-appearing and stable for discharge home with return precautions.  I do not think repeat imaging or MRI imaging is indicated.        Final Clinical Impression(s) / ED Diagnoses Final diagnoses:  Acute bilateral back pain, unspecified back location  Elevated blood pressure reading    Rx / DC Orders ED Discharge Orders     None         Jerilynn Montenegro, MD 01/07/24 2106

## 2024-01-07 NOTE — ED Provider Triage Note (Signed)
 Emergency Medicine Provider Triage Evaluation Note  FABRICIO MATSUDA , a 78 y.o. male  was evaluated in triage.  Pt complains of abdominal pain and back pain.  Has been for about a month.  Abdominal pain is all about the mid abdomen and radiates to the back at times.  Endorses 1 occurrence of diarrhea but no nausea vomiting or urinary symptoms.  Denies fever.  Had outpatient CT scan a couple days ago for pain but has not been informed of results.  Review of Systems  Positive: See above Negative: See above  Physical Exam  BP (!) 146/99   Pulse 61   Temp 98.1 F (36.7 C) (Oral)   Resp 16   SpO2 100%  Gen:   Awake, no distress   Resp:  Normal effort  MSK:   Moves extremities without difficulty  Other:    Medical Decision Making  Medically screening exam initiated at 11:31 AM.  Appropriate orders placed.  GUINN WEISHAUPT was informed that the remainder of the evaluation will be completed by another provider, this initial triage assessment does not replace that evaluation, and the importance of remaining in the ED until their evaluation is complete.  Work up started   Janalee Mcmurray, PA-C 01/07/24 1132

## 2024-01-07 NOTE — ED Notes (Signed)
 Uses voltaren for pain

## 2024-01-07 NOTE — Discharge Instructions (Addendum)
 Patient to take your blood pressure medicine when you get home as your blood pressure is pretty elevated here.  Otherwise follow-up with your primary care physician.  Your urine also had blood in it, you will need to get this rechecked with your doctor.  If you develop worsening, recurrent, or continued back pain, numbness or weakness in the legs, incontinence of your bowels or bladders, numbness of your buttocks, fever, abdominal pain, or any other new/concerning symptoms then return to the ER for evaluation.

## 2024-02-07 NOTE — Progress Notes (Signed)
 Patient ID: Daniel Cox, male   DOB: [birth year], 78 y.o.   MRN: [id number]  Reason for Consult: New Patient (Initial Visit)   Referred by Elida Grounds, DO  Subjective:     HPI Daniel Cox is a 78 y.o. 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  His abdominal pain resolved spontaneously.  Other than this episode he denies any previous or current episodes of significant abdominal or back pain.  He is compliant with aspirin  and Lipitor .  He has had previous PCI's most recently done in 2022 by Dr. Katheryne Pane.  He has had an appendectomy but denies any other surgical procedures.  He is a former smoker.  Past Medical History:  Diagnosis Date   Chronic kidney disease    Degenerative disc disease, cervical    Degenerative lumbar disc    Erectile dysfunction    Hyperlipidemia    Family History  Problem Relation Age of Onset   Heart attack Mother    Heart attack Father    Heart disease Sister    Heart attack Brother    Heart attack Brother    Diabetes Sister    Colon cancer Sister    Past Surgical History:  Procedure Laterality Date   Appendectony     COLONOSCOPY WITH PROPOFOL  N/A 08/16/2015   Procedure: COLONOSCOPY WITH PROPOFOL ;  Surgeon: Garrett Kallman, MD;  Location: [facility] ENDOSCOPY;  Service: Endoscopy;  Laterality: N/A;   CORONARY/GRAFT ACUTE MI REVASCULARIZATION N/A 10/23/2020   Procedure: Coronary/Graft Acute MI Revascularization;  Surgeon: Avanell Leigh, MD;  Location: [facility] INVASIVE CV LAB;  Service: Cardiovascular;  Laterality: N/A;   LEFT HEART CATH AND CORONARY ANGIOGRAPHY N/A 10/23/2020   Procedure: LEFT HEART CATH AND CORONARY ANGIOGRAPHY;  Surgeon: Avanell Leigh, MD;  Location: [facility] INVASIVE CV LAB;  Service: Cardiovascular;  Laterality: N/A;   LEFT HEART CATHETERIZATION WITH CORONARY ANGIOGRAM N/A 02/06/2013   Procedure: LEFT HEART CATHETERIZATION  WITH CORONARY ANGIOGRAM;  Surgeon: Mickiel Albany, MD;  Location: [facility] CATH LAB;  Service: Cardiovascular;  Laterality: N/A;   PERCUTANEOUS CORONARY STENT INTERVENTION (PCI-S) N/A 02/10/2013   Procedure: PERCUTANEOUS CORONARY STENT INTERVENTION (PCI-S);  Surgeon: Mickiel Albany, MD;  Location: [facility] CATH LAB;  Service: Cardiovascular;  Laterality: N/A;   Right shoulder      Short Social History:  Social History   Tobacco Use   Smoking status: Former    Current packs/day: 0.50    Average packs/day: 0.5 packs/day for 30.0 years (15.0 ttl pk-yrs)    Types: Cigarettes   Smokeless tobacco: Never   Tobacco comments:    2 weeks ago from 06-04-13  Substance Use Topics   Alcohol use: No    Allergies  Allergen Reactions   Penicillins Hives, Itching and Rash    Has patient had a PCN reaction causing immediate rash, facial/tongue/throat swelling, SOB or lightheadedness with hypotension: No Has patient had a PCN reaction causing severe rash involving mucus membranes or skin necrosis: No Has patient had a PCN reaction that required hospitalization No Has patient had a PCN reaction occurring within the last 10 years: No If all of the above answers are NO, then may proceed with Cephalosporin use.     Current Outpatient Medications  Medication Sig Dispense Refill   aspirin  EC 81 MG tablet Take 1 tablet (81 mg total) by mouth daily. 90 tablet 3  atorvastatin  (LIPITOR ) 80 MG tablet Take 80 mg by mouth daily.     cetirizine (ZYRTEC) 10 MG tablet Take 10 mg by mouth daily.     cholecalciferol (VITAMIN D) 1000 UNITS tablet Take 1,000 Units by mouth every morning.     clopidogrel  (PLAVIX ) 75 MG tablet TAKE 1 TABLET BY MOUTH DAILY 90 tablet 0   docusate sodium (COLACE) 100 MG capsule Take 100 mg by mouth daily.     ezetimibe (ZETIA) 10 MG tablet Take 10 mg by mouth daily.     isosorbide  mononitrate (IMDUR ) 30 MG 24 hr tablet TAKE 1 TABLET BY MOUTH DAILY 90 tablet 0   metoprolol  succinate  (TOPROL -XL) 25 MG 24 hr tablet TAKE 1 TABLET BY MOUTH DAILY 90 tablet 0   Multiple Vitamins-Minerals (MULTIVITAMIN WITH MINERALS) tablet Take 1 tablet by mouth every morning.     nitroGLYCERIN  (NITROSTAT ) 0.4 MG SL tablet Place 1 tablet (0.4 mg total) under the tongue every 5 (five) minutes as needed for chest pain. 25 tablet 7   NON FORMULARY CPAP Machine - Use as directed at bedtime.     polyvinyl alcohol (LIQUIFILM TEARS) 1.4 % ophthalmic solution Place 1 drop into both eyes as needed for dry eyes.     pregabalin  (LYRICA ) 75 MG capsule Take 150 mg by mouth 2 (two) times daily.     venlafaxine XR (EFFEXOR-XR) 150 MG 24 hr capsule Take 150 mg by mouth as needed. TAKE ONE CAPSULE BY MOUTH DAILY FOR MOOD     No current facility-administered medications for this visit.    REVIEW OF SYSTEMS  All other systems were reviewed and are negative     Objective:  Objective   Vitals:   02/08/24 1013  BP: 124/82  Pulse: 64  Resp: 16  Temp: 98.7 F (37.1 C)  TempSrc: Temporal  SpO2: 96%  Weight: 155 lb (70.3 kg)  Height: 5' 8 (1.727 m)   Body mass index is 23.57 kg/m.  Physical Exam General: no acute distress Cardiac: hemodynamically stable Pulm: normal work of breathing Abdomen: non-tender, no pulsatile mass Neuro: alert, no focal deficit Extremities: no edema, or cyanosis. Left traumatic medial ankle wound healing well Vascular:   Right: Palpable femoral, PT  Left: Palpable femoral, PT  Data: CT independently reviewed Small abdominal aortic aneurysm measuring approximately 3.2 cm. 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  We discussed natural history as well as the rate  of growth of iliac and aortic aneurysms.  I explained that he does not currently meet the threshold for intervention which is 5 cm for a AAA or 3.5 for an iliac aneurysm. Will plan to continue with annual surveillance at this time and we will see him back in 12 months with a CTA abdomen pelvis. Continue best medical management of aspirin , statin and abstinence from tobacco products. I also reviewed the red flag symptoms of rupture and he was instructed to present to the nearest emergency room should he experience these although I did explain that he is low risk at this time.  Recommendations to optimize cardiovascular risk: Abstinence from all tobacco products. Blood glucose control with goal A1c < 7%. Blood pressure  control with goal blood pressure < 140/90 mmHg. Lipid reduction therapy with goal LDL-C <100 mg/dL  Aspirin  81mg  PO QD.  Atorvastatin  40-80mg  PO QD (or other high intensity statin therapy).   Philipp Brawn MD [facility]

## 2024-02-08 ENCOUNTER — Ambulatory Visit: Attending: Vascular Surgery | Admitting: Vascular Surgery

## 2024-02-08 ENCOUNTER — Encounter: Payer: Self-pay | Admitting: Vascular Surgery

## 2024-02-08 VITALS — BP 124/82 | HR 64 | Temp 98.7°F | Resp 16 | Ht 68.0 in | Wt 155.0 lb

## 2024-02-08 DIAGNOSIS — I723 Aneurysm of iliac artery: Secondary | ICD-10-CM

## 2024-02-08 DIAGNOSIS — I7143 Infrarenal abdominal aortic aneurysm, without rupture: Secondary | ICD-10-CM | POA: Diagnosis not present

## 2024-02-27 ENCOUNTER — Encounter: Payer: Self-pay | Admitting: Cardiovascular Disease

## 2024-02-27 ENCOUNTER — Ambulatory Visit: Attending: Cardiovascular Disease | Admitting: Cardiovascular Disease

## 2024-02-27 VITALS — BP 130/88 | HR 52 | Resp 16 | Ht 68.0 in | Wt 158.4 lb

## 2024-02-27 DIAGNOSIS — I249 Acute ischemic heart disease, unspecified: Secondary | ICD-10-CM | POA: Diagnosis not present

## 2024-02-27 DIAGNOSIS — I2510 Atherosclerotic heart disease of native coronary artery without angina pectoris: Secondary | ICD-10-CM | POA: Diagnosis not present

## 2024-02-27 DIAGNOSIS — E785 Hyperlipidemia, unspecified: Secondary | ICD-10-CM

## 2024-02-27 DIAGNOSIS — I10 Essential (primary) hypertension: Secondary | ICD-10-CM | POA: Diagnosis not present

## 2024-02-27 NOTE — Assessment & Plan Note (Signed)
 History of hyperlipidemia on high-dose statin therapy and Zetia with lipid profile performed 06/20/2023 revealing total cholesterol 112, LDL of 60 and HDL 40.

## 2024-02-27 NOTE — Assessment & Plan Note (Signed)
 History of essential hypertension with blood pressure measured today 130/88.  He is on metoprolol .

## 2024-02-27 NOTE — Progress Notes (Signed)
 02/27/2024 Daniel Cox   30-Nov-1945  [id number]  Primary Physician Loreli Kins, MD Primary Cardiologist: Dorn JINNY Lesches MD GENI CODY MADEIRA, FSCAI  HPI:  Daniel Cox is a 78 y.o. fit-appearing married African-American male father of 3 children, grandfather of 12 grandchildren is retired from working for the city of [employer].  He is formally a patient of Dr. Theressa.  I am assuming his care.  His cardiac risk factors notable for ongoing tobacco abuse of 1 pack/week although he says he is trying to quit.  He has treated hypertension and hyperlipidemia.  Both parents died in their 54s of myocardial infarction's.  He had a non-STEMI back in 2014 and underwent cath by Dr. 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  He ultimately went femoral he placed a stent in his LAD.  He did have residual disease in his other vessels.  He presented with unstable angina 10/23/2020 and I cathed him via the femoral approach.  The culprit lesion was a marginal branch but I was unable to cannulate it because of tortuosity and elected to treat him medically.  His LAD stent was widely patent and he had normal LV function.  He is fairly active and has been asymptomatic.  He plays golf several days a week and walks.   Current Meds  Medication Sig   aspirin  EC 81 MG tablet Take 1 tablet (81 mg total) by mouth daily.   atorvastatin  (LIPITOR ) 80 MG tablet Take 80 mg by mouth daily.   cetirizine (ZYRTEC) 10 MG tablet Take 10 mg by mouth daily.   cholecalciferol (VITAMIN D) 1000 UNITS tablet Take 1,000 Units by mouth every morning.   clopidogrel  (PLAVIX ) 75 MG tablet TAKE 1 TABLET BY MOUTH DAILY   docusate sodium (COLACE) 100 MG capsule Take 100 mg by mouth daily.   ezetimibe (ZETIA) 10 MG tablet Take 10 mg by mouth daily.   isosorbide  mononitrate (IMDUR ) 30 MG 24 hr tablet TAKE 1 TABLET BY MOUTH DAILY   metoprolol  succinate (TOPROL -XL) 25 MG 24 hr tablet TAKE 1 TABLET BY MOUTH DAILY   Multiple  Vitamins-Minerals (MULTIVITAMIN WITH MINERALS) tablet Take 1 tablet by mouth every morning.   nitroGLYCERIN  (NITROSTAT ) 0.4 MG SL tablet Place 1 tablet (0.4 mg total) under the tongue every 5 (five) minutes as needed for chest pain.   NON FORMULARY CPAP Machine - Use as directed at bedtime.   polyvinyl alcohol (LIQUIFILM TEARS) 1.4 % ophthalmic solution Place 1 drop into both eyes as needed for dry eyes.   pregabalin  (LYRICA ) 75 MG capsule Take 150 mg by mouth 2 (two) times daily.   venlafaxine XR (EFFEXOR-XR) 150 MG 24 hr capsule Take 150 mg by mouth as needed. TAKE ONE CAPSULE BY MOUTH DAILY FOR MOOD     Allergies  Allergen Reactions   Penicillins Hives, Itching and Rash    Has patient had a PCN reaction causing immediate rash, facial/tongue/throat swelling, SOB or lightheadedness with hypotension: No Has patient had a PCN reaction causing severe rash involving mucus membranes or skin necrosis: No Has patient had a PCN reaction that required hospitalization No Has patient had a PCN reaction occurring within the last 10 years: No If all of the above answers are NO, then may proceed with Cephalosporin use.     Social History   Socioeconomic History   Marital status: Married    Spouse name: Not on file   Number of children: 3   Years of education: Not on  file   Highest education level: Not on file  Occupational History   Not on file  Tobacco Use   Smoking status: Former    Current packs/day: 0.50    Average packs/day: 0.5 packs/day for 30.0 years (15.0 ttl pk-yrs)    Types: Cigarettes   Smokeless tobacco: Never   Tobacco comments:    2 weeks ago from 06-04-13  Vaping Use   Vaping status: Never Used  Substance and Sexual Activity   Alcohol use: No   Drug use: No   Sexual activity: Yes  Other Topics Concern   Not on file  Social History Narrative   Not on file   Social Drivers of Health   Financial Resource Strain: Not on file  Food Insecurity: Not on file   Transportation Needs: Not on file  Physical Activity: Not on file  Stress: Not on file  Social Connections: Unknown (01/09/2022)   Received from [facility]   Social Network    Social Network: Not on file  Intimate Partner Violence: Unknown (12/01/2021)   Received from [facility]   HITS    Physically Hurt: Not on file    Insult or Talk Down To: Not on file    Threaten Physical Harm: Not on file    Scream or Curse: Not on file     Review of Systems: General: negative for chills, fever, night sweats or weight changes.  Cardiovascular: negative for chest pain, dyspnea on exertion, edema, orthopnea, palpitations, paroxysmal nocturnal dyspnea or shortness of breath Dermatological: negative for rash Respiratory: negative for cough or wheezing Urologic: negative for hematuria Abdominal: negative for nausea, vomiting, diarrhea, bright red blood per rectum, melena, or hematemesis Neurologic: negative for visual changes, syncope, or dizziness All other systems reviewed and are otherwise negative except as noted above.    Blood pressure 130/88, pulse (!) 52, resp. rate 16, height 5' 8 (1.727 m), weight 158 lb 6.4 oz (71.8 kg), SpO2 97%.  General appearance: alert and no distress Neck: no adenopathy, no carotid bruit, no JVD, supple, symmetrical, trachea midline, and thyroid not enlarged, symmetric, no tenderness/mass/nodules Lungs: clear to auscultation bilaterally Heart: regular rate and rhythm, S1, S2 normal, no murmur, click, rub or gallop Extremities: extremities normal, atraumatic, no cyanosis or edema Pulses: 2+ and symmetric Skin: Skin color, texture, turgor normal. No rashes or lesions Neurologic: Grossly normal  EKG EKG Interpretation Date/Time:  Wednesday February 27 2024 13:35:05 EDT Ventricular Rate:  53 PR Interval:  160 QRS Duration:  94 QT Interval:  408 QTC Calculation: 382 R Axis:   -30  Text Interpretation: Sinus bradycardia Left axis deviation Minimal voltage  criteria for LVH, may be normal variant ( R in aVL ) When compared with ECG of 19-Feb-2023 11:12, No significant change was found Confirmed by Court Carrier [phone]) on 02/27/2024 1:45:51 PM    ASSESSMENT AND PLAN:   Acute coronary syndrome (HCC) History of CAD status post non-STEMI in 2014.  He was By Dr. Claudene initially radially and then fibrillate.  Apparently had an embolic stroke at that time.  Dr. Claudene placed a stent in his LAD.  He came back in with substernal chest pain.  He had lateral ST segment elevation 10/23/2020.  He was brought to the Cath Lab by myself and I cathed him via the femoral approach revealing a patent LAD stent, high-grade diagonal branch disease, distal RCA disease and first obtuse marginal branch disease.  I attempted PCI but was unable to cannulate the vessel because  of tortuosity and aborted the procedure and elected to treat medically.  He is been asymptomatic since.  Hyperlipidemia with target LDL less than 70 History of hyperlipidemia on high-dose statin therapy and Zetia with lipid profile performed 06/20/2023 revealing total cholesterol 112, LDL of 60 and HDL 40.  Essential hypertension, benign History of essential hypertension with blood pressure measured today 130/88.  He is on metoprolol .     Dorn DOROTHA Lesches MD [facility], [facility] 02/27/2024 1:49 PM

## 2024-02-27 NOTE — Assessment & Plan Note (Signed)
 History of CAD status post non-STEMI in 2014.  He was By Dr. Claudene initially radially and then fibrillate.  Apparently had an embolic stroke at that time.  Dr. Claudene placed a stent in his LAD.  He came back in with substernal chest pain.  He had lateral ST segment elevation 10/23/2020.  He was brought to the Cath Lab by myself and I cathed him via the femoral approach revealing a patent LAD stent, high-grade diagonal branch disease, distal RCA disease and first obtuse marginal branch disease.  I attempted PCI but was unable to cannulate the vessel because of tortuosity and aborted the procedure and elected to treat medically.  He is been asymptomatic since.

## 2024-02-27 NOTE — Patient Instructions (Signed)
 Medication Instructions:  The current medical regimen is effective;  continue present plan and medications.  *If you need a refill on your cardiac medications before your next appointment, please call your pharmacy*  Follow-Up: At [facility], you and your health needs are our priority.  As part of our continuing mission to provide you with exceptional heart care, our providers are all part of one team.  This team includes your primary Cardiologist (physician) and Advanced Practice Providers or APPs (Physician Assistants and Nurse Practitioners) who all work together to provide you with the care you need, when you need it.  Your next appointment:   1 year(s)  Provider:   Dorn Lesches, MD    We recommend signing up for the patient portal called MyChart.  Sign up information is provided on this After Visit Summary.  MyChart is used to connect with patients for Virtual Visits (Telemedicine).  Patients are able to view lab/test results, encounter notes, upcoming appointments, etc.  Non-urgent messages can be sent to your provider as well.   To learn more about what you can do with MyChart, go to [email or web address].

## 2024-05-10 DIAGNOSIS — Z23 Encounter for immunization: Secondary | ICD-10-CM | POA: Diagnosis not present

## 2024-06-18 DIAGNOSIS — N183 Chronic kidney disease, stage 3 unspecified: Secondary | ICD-10-CM | POA: Diagnosis not present

## 2024-06-18 DIAGNOSIS — I25119 Atherosclerotic heart disease of native coronary artery with unspecified angina pectoris: Secondary | ICD-10-CM | POA: Diagnosis not present

## 2024-06-18 DIAGNOSIS — Z0000 Encounter for general adult medical examination without abnormal findings: Secondary | ICD-10-CM | POA: Diagnosis not present

## 2024-06-18 DIAGNOSIS — I1310 Hypertensive heart and chronic kidney disease without heart failure, with stage 1 through stage 4 chronic kidney disease, or unspecified chronic kidney disease: Secondary | ICD-10-CM | POA: Diagnosis not present

## 2024-06-18 DIAGNOSIS — I714 Abdominal aortic aneurysm, without rupture, unspecified: Secondary | ICD-10-CM | POA: Diagnosis not present

## 2024-06-18 DIAGNOSIS — M549 Dorsalgia, unspecified: Secondary | ICD-10-CM | POA: Diagnosis not present

## 2024-06-18 DIAGNOSIS — E7800 Pure hypercholesterolemia, unspecified: Secondary | ICD-10-CM | POA: Diagnosis not present

## 2024-06-18 DIAGNOSIS — R7303 Prediabetes: Secondary | ICD-10-CM | POA: Diagnosis not present

## 2024-06-18 DIAGNOSIS — I77810 Thoracic aortic ectasia: Secondary | ICD-10-CM | POA: Diagnosis not present

## 2024-06-18 DIAGNOSIS — K219 Gastro-esophageal reflux disease without esophagitis: Secondary | ICD-10-CM | POA: Diagnosis not present

## 2024-06-18 LAB — LAB REPORT - SCANNED
A1c: 6.1
EGFR: 50

## 2024-06-19 ENCOUNTER — Ambulatory Visit: Payer: Self-pay | Admitting: Cardiovascular Disease

## 2024-06-20 ENCOUNTER — Encounter: Payer: Self-pay | Admitting: *Deleted

## 2024-07-07 ENCOUNTER — Ambulatory Visit (INDEPENDENT_AMBULATORY_CARE_PROVIDER_SITE_OTHER)

## 2024-07-07 VITALS — BP 144/86 | HR 71 | Ht 68.0 in | Wt 157.0 lb

## 2024-07-07 DIAGNOSIS — R490 Dysphonia: Secondary | ICD-10-CM | POA: Diagnosis not present

## 2024-07-07 DIAGNOSIS — J383 Other diseases of vocal cords: Secondary | ICD-10-CM

## 2024-07-07 NOTE — Progress Notes (Signed)
 Dear Dr. Loreli, Here is my assessment for our mutual patient, Daniel Cox. Thank you for allowing me the opportunity to care for your patient. Please do not hesitate to contact me should you have any other questions. Sincerely, Dr. Hadassah Parody  Otolaryngology Clinic Note Referring provider: Dr. Loreli HPI:   Initial HPI (07/07/2024) Discussed the use of AI scribe software for clinical note transcription with the patient, who gave verbal consent to proceed.  History of Present Illness FESTUS PURSEL is a 78 year old male with a history of smoking since he was 50 who presents with hoarseness for two months.  Hoarseness - Hoarseness has persisted for nearly two months - Onset occurred approximately one week after receiving a flu vaccination - No associated throat pain, ear pain, or palpable neck masses  Tobacco use - Significant history of tobacco use  Cardiovascular history - Current medications include aspirin  and Plavix , follows with cardiology    Independent Review of Additional Tests or Records:  Referral 06/20/24 [name], MD: hoarse voice over last month. Smoking since he was 78  years old.   01/07/24 CBC: Hgb 14.8  PMH/Meds/All/SocHx/FamHx/ROS:   Past Medical History:  Diagnosis Date   Chronic kidney disease    Degenerative disc disease, cervical    Degenerative lumbar disc    Erectile dysfunction    Hyperlipidemia      Past Surgical History:  Procedure Laterality Date   Appendectony     COLONOSCOPY WITH PROPOFOL  N/A 08/16/2015   Procedure: COLONOSCOPY WITH PROPOFOL ;  Surgeon: Gladis MARLA Louder, MD;  Location: [facility];  Service: Endoscopy;  Laterality: N/A;   CORONARY/GRAFT ACUTE MI REVASCULARIZATION N/A 10/23/2020   Procedure: Coronary/Graft Acute MI Revascularization;  Surgeon: Court Dorn PARAS, MD;  Location: [facility] INVASIVE CV LAB;  Service: Cardiovascular;  Laterality: N/A;   LEFT HEART CATH AND CORONARY ANGIOGRAPHY N/A 10/23/2020   Procedure:  LEFT HEART CATH AND CORONARY ANGIOGRAPHY;  Surgeon: Court Dorn PARAS, MD;  Location: [facility] INVASIVE CV LAB;  Service: Cardiovascular;  Laterality: N/A;   LEFT HEART CATHETERIZATION WITH CORONARY ANGIOGRAM N/A 02/06/2013   Procedure: LEFT HEART CATHETERIZATION WITH CORONARY ANGIOGRAM;  Surgeon: Victory LELON Claudene DOUGLAS, MD;  Location: [facility] CATH LAB;  Service: Cardiovascular;  Laterality: N/A;   PERCUTANEOUS CORONARY STENT INTERVENTION (PCI-S) N/A 02/10/2013   Procedure: PERCUTANEOUS CORONARY STENT INTERVENTION (PCI-S);  Surgeon: Victory LELON Claudene DOUGLAS, MD;  Location: [facility] CATH LAB;  Service: Cardiovascular;  Laterality: N/A;   Right shoulder      Family History  Problem Relation Age of Onset   Heart attack Mother    Heart attack Father    Heart disease Sister    Heart attack Brother    Heart attack Brother    Diabetes Sister    Colon cancer Sister      Social Connections: Unknown (01/09/2022)   Received from [facility]   Social Network    Social Network: Not on file     Current Outpatient Medications  Medication Instructions   aspirin  EC 81 mg, Oral, Daily   atorvastatin  (LIPITOR ) 80 mg, Daily   cetirizine (ZYRTEC) 10 mg, Daily   cholecalciferol (VITAMIN D) 1,000 Units, Every morning   clopidogrel  (PLAVIX ) 75 MG tablet TAKE 1 TABLET BY MOUTH DAILY   docusate sodium (COLACE) 100 mg, Daily   ezetimibe (ZETIA) 10 mg, Daily   isosorbide  mononitrate (IMDUR ) 30 MG 24 hr tablet TAKE 1 TABLET BY MOUTH DAILY   metoprolol  succinate (TOPROL -XL) 25 MG 24 hr tablet  TAKE 1 TABLET BY MOUTH DAILY   Multiple Vitamins-Minerals (MULTIVITAMIN WITH MINERALS) tablet 1 tablet, Every morning   nitroGLYCERIN  (NITROSTAT ) 0.4 mg, Sublingual, Every 5 min PRN   NON FORMULARY CPAP Machine - Use as directed at bedtime.   polyvinyl alcohol (LIQUIFILM TEARS) 1.4 % ophthalmic solution 1 drop, As needed   pregabalin  (LYRICA ) 150 mg, 2 times daily   venlafaxine XR (EFFEXOR-XR) 150 mg, As needed     Physical Exam:   BP (!)  144/86 (BP Location: Left Arm, Patient Position: Sitting)   Pulse 71   Ht 5' 8 (1.727 m)   Wt 157 lb (71.2 kg)   SpO2 95%   BMI 23.87 kg/m   Salient findings:  CN II-XII intact Bilateral EAC clear and TM intact with well pneumatized middle ear spaces No lesions of oral cavity/oropharynx No obviously palpable neck masses/lymphadenopathy/thyromegaly No respiratory distress or stridor  Seprately Identifiable Procedures:  Prior to initiating any procedures, risks/benefits/alternatives were explained to the patient and verbal consent obtained.  Procedure Note (07/07/2024) Pre-procedure diagnosis:  Dysphonia  Post-procedure diagnosis: Same, left vocal cord lesion Procedure: Transnasal Fiberoptic Laryngoscopy, CPT 31575 - Mod 25 Indication: dysphonia Complications: None apparent EBL: 0 mL  The procedure was undertaken to further evaluate the patient's complaint of dysphonia, with mirror exam inadequate for appropriate examination due to gag reflex and poor patient tolerance  Procedure:  Patient was identified as correct patient. Verbal consent was obtained. The nose was sprayed with oxymetazoline and 4% lidocaine . The The flexible laryngoscope was passed through the nose to view the nasal cavity, pharynx (oropharynx, hypopharynx) and larynx.  The larynx was examined at rest and during multiple phonatory tasks. Documentation was obtained and reviewed with patient. The scope was removed. The patient tolerated the procedure well.  Findings: The nasal cavity and nasopharynx did not reveal any masses or lesions, mucosa appeared to be without obvious lesions. The tongue base, pharyngeal walls, piriform sinuses, vallecula, epiglottis and postcricoid region are normal in appearance EXCEPT: left anterior vocal fold lesion, concerning for possible premalignant or malignant lesion. The visualized portion of the subglottis and proximal trachea is widely patent. The vocal folds are mobile bilaterally.    Electronically signed by: Hadassah JAYSON Parody, MD 07/07/2024 3:09 PM   Impression & Plans:  Liandro Thelin is a 78 y.o. male with   1. Lesion of true vocal cord   2. 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 Biopsy needed due to smoking history. Coordination with cardiologist required for Plavix  management. - Scheduled surgery for DL biopsy  - Coordinated with cardiologist for Plavix  management. - Pre-op phone visit  - Advised on post-operative care and recovery.  Risks include pain, bleeding, infection, need for repeat procedures, airway compromise, failure to diagnose. He would like to proceed.   See below regarding exact medications prescribed this encounter including dosages and route: No orders of the defined types were placed in this encounter.   Thank you for allowing me the opportunity to care for your patient. Please do not hesitate to contact me should you have any other questions.  Sincerely, Hadassah Parody, MD Otolaryngologist (ENT), [facility] Phone: [phone] Fax: [phone]

## 2024-07-08 ENCOUNTER — Telehealth: Payer: Self-pay | Admitting: Cardiovascular Disease

## 2024-07-08 ENCOUNTER — Telehealth (HOSPITAL_BASED_OUTPATIENT_CLINIC_OR_DEPARTMENT_OTHER): Payer: Self-pay

## 2024-07-08 NOTE — Telephone Encounter (Signed)
 Office calling to make preop aware they have submitted clearance request for pt. Surgery date was put down as TBD, but now has a scheduled date of 11/19. Please advise.

## 2024-07-08 NOTE — Telephone Encounter (Signed)
   Pre-operative Risk Assessment    Patient Name: Daniel Cox  DOB: 1946-03-25 MRN: [id number]   Date of last office visit: 02/27/24 with Dr. Court Date of next office visit: NA   Request for Surgical Clearance    Procedure:  Direct Laryngoscopy with Biopsy of Vocal cord lesion   Date of Surgery:  Clearance 07/16/24                                 Surgeon:  Dr. Greggory Surgeon's Group or Practice Name:  [facility] Phone number:  [phone] Fax number:  [phone]   Type of Clearance Requested:   - Medical  - Pharmacy:  Hold Aspirin  and Clopidogrel  (Plavix ) not indicated   Type of Anesthesia:  General    Additional requests/questions:    Bonney Augustin JONETTA Delores   07/08/2024, 4:10 PM

## 2024-07-08 NOTE — Telephone Encounter (Signed)
 Left message to call back to schedule tele pre op appt.

## 2024-07-08 NOTE — Telephone Encounter (Signed)
   Name: Daniel Cox  DOB: December 17, 1945  MRN: [id number]  Primary Cardiologist: Dorn Lesches, MD   Preoperative team, please contact this patient and set up a phone call appointment for further preoperative risk assessment. Please obtain consent and complete medication review. Thank you for your help.  I confirm that guidance regarding antiplatelet and oral anticoagulation therapy has been completed and, if necessary, noted below.  Per office protocol, he may hold Plavix  for 5 days prior to procedure and should resume as soon as hemodynamically stable postoperatively. Ideally aspirin  should be continued without interruption, however if the bleeding risk is too great, aspirin  may be held for 5-7 days prior to surgery.     I also confirmed the patient resides in the state of [place] . As per [place] [employer] telemedicine laws, the patient must reside in the state in which the provider is licensed.    Barnie Hila, NP 07/08/2024, 5:00 PM  [facility]

## 2024-07-09 NOTE — Telephone Encounter (Signed)
 2nd attempt to reach the pt to schedule tele preop appt. I will inform the requesting office the pt needs a tele visit for preop clearance.

## 2024-07-09 NOTE — Telephone Encounter (Signed)
 This has been handled in a separate phone note dated 07/08/24, will remove this one from preop box.

## 2024-07-11 NOTE — Telephone Encounter (Signed)
 3rd attempt to reach the pt to schedule tele preop appt. I will update the surgeon's office again, see previous notes.

## 2024-07-18 ENCOUNTER — Encounter (HOSPITAL_COMMUNITY): Payer: Self-pay

## 2024-07-21 ENCOUNTER — Telehealth (INDEPENDENT_AMBULATORY_CARE_PROVIDER_SITE_OTHER): Payer: Self-pay

## 2024-07-21 NOTE — Telephone Encounter (Signed)
 Tried to call patient to see if he had stopped his medication for his surgery. Patients voicemail is full and was unable to leave a voicemail.

## 2024-07-21 NOTE — Progress Notes (Signed)
 SDW call attempt: Multiple attempts to reach patient, VM is full and the other number goes straight to VM.  Detailed message left for patient to arrive at 1045, ERAS until 1015, proper hygiene, medications to take in the morning, should have already stopped plavix  and ASA, Unable to verify allergies, medications, stoppage of anti-coagulants, height, weight, medical or surgical history.

## 2024-07-22 ENCOUNTER — Ambulatory Visit (HOSPITAL_COMMUNITY): Admission: RE | Admit: 2024-07-22 | Discharge: 2024-07-22 | Disposition: A | Discharge status: Home / Self Care

## 2024-07-22 ENCOUNTER — Other Ambulatory Visit: Payer: Self-pay

## 2024-07-22 ENCOUNTER — Encounter (HOSPITAL_COMMUNITY): Admission: RE | Disposition: A | Discharge status: Home / Self Care | Payer: Self-pay | Source: Home / Self Care

## 2024-07-22 ENCOUNTER — Ambulatory Visit (HOSPITAL_COMMUNITY): Admitting: Certified Registered Nurse Anesthetist

## 2024-07-22 ENCOUNTER — Encounter (HOSPITAL_COMMUNITY): Payer: Self-pay

## 2024-07-22 ENCOUNTER — Encounter (HOSPITAL_COMMUNITY): Admitting: Certified Registered Nurse Anesthetist

## 2024-07-22 DIAGNOSIS — F32A Depression, unspecified: Secondary | ICD-10-CM | POA: Insufficient documentation

## 2024-07-22 DIAGNOSIS — Z87891 Personal history of nicotine dependence: Secondary | ICD-10-CM | POA: Diagnosis not present

## 2024-07-22 DIAGNOSIS — I2510 Atherosclerotic heart disease of native coronary artery without angina pectoris: Secondary | ICD-10-CM

## 2024-07-22 DIAGNOSIS — C320 Malignant neoplasm of glottis: Secondary | ICD-10-CM | POA: Insufficient documentation

## 2024-07-22 DIAGNOSIS — I25119 Atherosclerotic heart disease of native coronary artery with unspecified angina pectoris: Secondary | ICD-10-CM | POA: Diagnosis not present

## 2024-07-22 DIAGNOSIS — F17210 Nicotine dependence, cigarettes, uncomplicated: Secondary | ICD-10-CM | POA: Diagnosis not present

## 2024-07-22 DIAGNOSIS — G4730 Sleep apnea, unspecified: Secondary | ICD-10-CM | POA: Insufficient documentation

## 2024-07-22 DIAGNOSIS — I739 Peripheral vascular disease, unspecified: Secondary | ICD-10-CM | POA: Diagnosis not present

## 2024-07-22 DIAGNOSIS — J383 Other diseases of vocal cords: Secondary | ICD-10-CM

## 2024-07-22 DIAGNOSIS — I10 Essential (primary) hypertension: Secondary | ICD-10-CM | POA: Diagnosis not present

## 2024-07-22 DIAGNOSIS — I252 Old myocardial infarction: Secondary | ICD-10-CM | POA: Diagnosis not present

## 2024-07-22 DIAGNOSIS — Z955 Presence of coronary angioplasty implant and graft: Secondary | ICD-10-CM | POA: Diagnosis not present

## 2024-07-22 DIAGNOSIS — M1990 Unspecified osteoarthritis, unspecified site: Secondary | ICD-10-CM | POA: Diagnosis not present

## 2024-07-22 DIAGNOSIS — N189 Chronic kidney disease, unspecified: Secondary | ICD-10-CM | POA: Insufficient documentation

## 2024-07-22 DIAGNOSIS — I129 Hypertensive chronic kidney disease with stage 1 through stage 4 chronic kidney disease, or unspecified chronic kidney disease: Secondary | ICD-10-CM | POA: Diagnosis not present

## 2024-07-22 HISTORY — DX: Sleep apnea, unspecified: G47.30

## 2024-07-22 HISTORY — PX: KENALOG INJECTION: SHX5298

## 2024-07-22 HISTORY — DX: Atherosclerotic heart disease of native coronary artery without angina pectoris: I25.10

## 2024-07-22 HISTORY — DX: Depression, unspecified: F32.A

## 2024-07-22 HISTORY — DX: Personal history of urinary calculi: Z87.442

## 2024-07-22 HISTORY — DX: Cerebral infarction, unspecified: I63.9

## 2024-07-22 HISTORY — DX: Essential (primary) hypertension: I10

## 2024-07-22 HISTORY — PX: MICROLARYNGOSCOPY WITH LASER: SHX5972

## 2024-07-22 HISTORY — DX: Acute myocardial infarction, unspecified: I21.9

## 2024-07-22 LAB — BASIC METABOLIC PANEL WITH GFR
Anion gap: 10 (ref 5–15)
BUN: 16 mg/dL (ref 8–23)
CO2: 22 mmol/L (ref 22–32)
Calcium: 8.9 mg/dL (ref 8.9–10.3)
Chloride: 108 mmol/L (ref 98–111)
Creatinine, Ser: 1.51 mg/dL — ABNORMAL HIGH (ref 0.61–1.24)
GFR, Estimated: 47 mL/min — ABNORMAL LOW (ref >60)
Glucose, Bld: 82 mg/dL (ref 70–99)
Potassium: 3.8 mmol/L (ref 3.5–5.1)
Sodium: 140 mmol/L (ref 135–145)

## 2024-07-22 LAB — CBC
HCT: 38.9 % — ABNORMAL LOW (ref 39.0–52.0)
Hemoglobin: 13.1 g/dL (ref 13.0–17.0)
MCH: 33.9 pg (ref 26.0–34.0)
MCHC: 33.7 g/dL (ref 30.0–36.0)
MCV: 100.8 fL — ABNORMAL HIGH (ref 80.0–100.0)
Platelets: 172 K/uL (ref 150–400)
RBC: 3.86 MIL/uL — ABNORMAL LOW (ref 4.22–5.81)
RDW: 12.2 % (ref 11.5–15.5)
WBC: 3.8 K/uL — ABNORMAL LOW (ref 4.0–10.5)
nRBC: 0 % (ref 0.0–0.2)

## 2024-07-22 SURGERY — MICROLARYNGOSCOPY, WITH PROCEDURE USING LASER
Anesthesia: General | Site: Throat

## 2024-07-22 MED ORDER — FENTANYL CITRATE (PF) 250 MCG/5ML IJ SOLN
INTRAMUSCULAR | Status: DC | PRN
  Administered 2024-07-22: 25 ug via INTRAVENOUS
  Administered 2024-07-22: 75 ug via INTRAVENOUS

## 2024-07-22 MED ORDER — HYDROCODONE-ACETAMINOPHEN 5-325 MG PO TABS: 1.0000 | ORAL_TABLET | Freq: Four times a day (QID) | ORAL | 0 refills | Status: AC | PRN

## 2024-07-22 MED ORDER — TRIAMCINOLONE ACETONIDE 40 MG/ML IJ SUSP
INTRAMUSCULAR | Status: AC
  Filled 2024-07-22: qty 5

## 2024-07-22 MED ORDER — LIDOCAINE 2% (20 MG/ML) 5 ML SYRINGE
INTRAMUSCULAR | Status: AC
Start: 2024-07-22 — End: 2024-07-22
  Filled 2024-07-22: qty 5

## 2024-07-22 MED ORDER — ONDANSETRON HCL 4 MG/2ML IJ SOLN
INTRAMUSCULAR | Status: AC
  Filled 2024-07-22: qty 2

## 2024-07-22 MED ORDER — ROCURONIUM BROMIDE 10 MG/ML (PF) SYRINGE
PREFILLED_SYRINGE | INTRAVENOUS | Status: DC | PRN
  Administered 2024-07-22: 50 mg via INTRAVENOUS

## 2024-07-22 MED ORDER — DROPERIDOL 2.5 MG/ML IJ SOLN: 0.6250 mg | Freq: Once | INTRAMUSCULAR | Status: DC | PRN

## 2024-07-22 MED ORDER — ROCURONIUM BROMIDE 10 MG/ML (PF) SYRINGE
PREFILLED_SYRINGE | INTRAVENOUS | Status: AC
Start: 2024-07-22 — End: 2024-07-22
  Filled 2024-07-22: qty 10

## 2024-07-22 MED ORDER — ORAL CARE MOUTH RINSE: 15.0000 mL | Freq: Once | OROMUCOSAL | Status: AC

## 2024-07-22 MED ORDER — SUGAMMADEX SODIUM 200 MG/2ML IV SOLN
INTRAVENOUS | Status: DC | PRN
  Administered 2024-07-22: 200 mg via INTRAVENOUS

## 2024-07-22 MED ORDER — TRIAMCINOLONE ACETONIDE 40 MG/ML IJ SUSP
INTRAMUSCULAR | Status: DC | PRN
  Administered 2024-07-22: 1 mL via INTRAMUSCULAR

## 2024-07-22 MED ORDER — ACETAMINOPHEN 10 MG/ML IV SOLN
1000.0000 mg | Freq: Once | INTRAVENOUS | Status: DC | PRN
Start: 2024-07-22 — End: 2024-07-22

## 2024-07-22 MED ORDER — OXYMETAZOLINE HCL 0.05 % NA SOLN
NASAL | Status: AC
  Filled 2024-07-22: qty 30

## 2024-07-22 MED ORDER — LIDOCAINE 2% (20 MG/ML) 5 ML SYRINGE
INTRAMUSCULAR | Status: DC | PRN
  Administered 2024-07-22: 60 mg via INTRAVENOUS

## 2024-07-22 MED ORDER — DEXAMETHASONE SOD PHOSPHATE PF 10 MG/ML IJ SOLN
INTRAMUSCULAR | Status: DC | PRN
  Administered 2024-07-22: 10 mg via INTRAVENOUS

## 2024-07-22 MED ORDER — 0.9 % SODIUM CHLORIDE (POUR BTL) OPTIME
TOPICAL | Status: DC | PRN
  Administered 2024-07-22: 1000 mL

## 2024-07-22 MED ORDER — FENTANYL CITRATE (PF) 100 MCG/2ML IJ SOLN
INTRAMUSCULAR | Status: AC
  Filled 2024-07-22: qty 2

## 2024-07-22 MED ORDER — PROPOFOL 10 MG/ML IV BOLUS
INTRAVENOUS | Status: AC
  Filled 2024-07-22: qty 20

## 2024-07-22 MED ORDER — ONDANSETRON HCL 4 MG/2ML IJ SOLN
INTRAMUSCULAR | Status: DC | PRN
  Administered 2024-07-22: 4 mg via INTRAVENOUS

## 2024-07-22 MED ORDER — PROPOFOL 10 MG/ML IV BOLUS
INTRAVENOUS | Status: DC | PRN
  Administered 2024-07-22: 150 mg via INTRAVENOUS

## 2024-07-22 MED ORDER — CHLORHEXIDINE GLUCONATE 0.12 % MT SOLN
15.0000 mL | Freq: Once | OROMUCOSAL | Status: AC
  Administered 2024-07-22: 15 mL via OROMUCOSAL
  Filled 2024-07-22: qty 15

## 2024-07-22 MED ORDER — LACTATED RINGERS IV SOLN: INTRAVENOUS | Status: DC

## 2024-07-22 MED ORDER — OXYMETAZOLINE HCL 0.05 % NA SOLN
NASAL | Status: DC | PRN
  Administered 2024-07-22: 1 via TOPICAL

## 2024-07-22 MED ORDER — EPINEPHRINE HCL (NASAL) 0.1 % NA SOLN
NASAL | Status: AC
Start: 2024-07-22 — End: 2024-07-22
  Filled 2024-07-22: qty 90

## 2024-07-22 MED ORDER — FENTANYL CITRATE (PF) 100 MCG/2ML IJ SOLN: 25.0000 ug | INTRAMUSCULAR | Status: DC | PRN

## 2024-07-22 SURGICAL SUPPLY — 28 items
BAG COUNTER SPONGE SURGICOUNT (BAG) ×2 IMPLANT
BALLN PULMONARY 10-12 (MISCELLANEOUS) IMPLANT
BALLOON PULM 12 13.5 15X75 (BALLOONS) IMPLANT
BLADE SURG 15 STRL LF DISP TIS (BLADE) IMPLANT
BNDG EYE OVAL 2 1/8 X 2 5/8 (GAUZE/BANDAGES/DRESSINGS) ×4 IMPLANT
CANISTER SUCTION 3000ML PPV (SUCTIONS) ×2 IMPLANT
CNTNR URN SCR LID CUP LEK RST (MISCELLANEOUS) IMPLANT
COVER BACK TABLE 60X90IN (DRAPES) ×2 IMPLANT
COVER MAYO STAND STRL (DRAPES) ×2 IMPLANT
DRAPE HALF SHEET 40X57 (DRAPES) ×2 IMPLANT
GAUZE SPONGE 4X4 12PLY STRL (GAUZE/BANDAGES/DRESSINGS) ×2 IMPLANT
GLOVE BIO SURGEON STRL SZ 6 (GLOVE) ×2 IMPLANT
GOWN STRL REUS W/ TWL LRG LVL3 (GOWN DISPOSABLE) IMPLANT
KIT BASIN OR (CUSTOM PROCEDURE TRAY) ×2 IMPLANT
KIT TURNOVER KIT B (KITS) ×2 IMPLANT
NDL HYPO 25GX1X1/2 BEV (NEEDLE) IMPLANT
PAD ARMBOARD POSITIONER FOAM (MISCELLANEOUS) ×4 IMPLANT
PATTIES SURGICAL .5X1.5 (GAUZE/BANDAGES/DRESSINGS) ×2 IMPLANT
POSITIONER HEAD DONUT 9IN (MISCELLANEOUS) IMPLANT
SET COLLECT BLD 25X3/4 12 (NEEDLE) ×2 IMPLANT
SOLN 0.9% NACL POUR BTL 1000ML (IV SOLUTION) ×2 IMPLANT
SOLN STERILE WATER BTL 1000 ML (IV SOLUTION) ×2 IMPLANT
SOLUTION ANTFG W/FOAM PAD STRL (MISCELLANEOUS) ×2 IMPLANT
SURGILUBE 2OZ TUBE FLIPTOP (MISCELLANEOUS) IMPLANT
SUT SILK 2 0 PERMA HAND 18 BK (SUTURE) IMPLANT
SYR TB 1ML LUER SLIP (SYRINGE) ×2 IMPLANT
TOWEL GREEN STERILE FF (TOWEL DISPOSABLE) ×2 IMPLANT
TUBE CONNECTING 12X1/4 (SUCTIONS) ×2 IMPLANT

## 2024-07-22 NOTE — Discharge Instructions (Addendum)
 Post Anesthesia Guidelines  During recovery from anesthesia  You may feel drowsy and reflexes may be slowed for 24 hours:  -Do not drive, use machinery, appliances, ride bicycles or scooters  -Do not consume alcohol  -Do not make important decisions   Eating and drinking:  -Drink plenty of liquids today  -Avoid fried or spicy foods today  -Return to your regular diet slowly over the next 24 hours  -Please call if unable to keep fluids down  If your throat is sore: -A breathing tube may have been used during your surgery -Drink cool liquids or gargle with warm salt water -If soreness lasts more than a few days, please call us   Surgery Discharge Instructions:  Call clinic or return to ED if you: - develop a fever greater than 101.4 - have shaking chills or are feeling ill - become short of breath - have uncontrollable nausea or vomiting - can't hold down food or liquids or feel as though you are getting dehydrated - any other acute events, problems, or concerns  Wound Care/Dressings/Drain Instructions:  - none  Medications: - Resume your regular home medications except as detailed in the medication reconciliation.  - For pain, take tylenol  650mg  every 6 hours.  If that is not sufficient, a stronger pain medication has been prescribed to you. Do not mix with any other narcotic medication.  Follow Up:  - A follow up appointment should be scheduled for you after discharge, and our secretaries will be contacting you with the details of that appointment. However, If you do not hear about your appoinment in 3 business days, please call the provided surgery clinic number and confirm/schedule your appointment.   @[email or web address] @@[email or web address] @  Activity/Restrictions:  - Resume your regular activities, as tolerated. Avoid heavy lifting or straining (more than 5 lbs) for . - Avoid speaking if possible for 3 days to assist with healing   Diet: - Resume your regular diet, as  tolerated  Additional Instructions: - Please take an over the counter stool softener while taking narcotic pain medication - DO NOT MIX NARCOTIC PAIN MEDICATIONS OR TAKE NARCOTIC PRESCRIPTIONS AT THE SAME TIME - DO NOT DRIVE OR OPERATE HEAVY MACHINERY WHILE ON NARCOTICS  - DO NOT TAKE MORE THAN 4 GRAMS (4000mg ) OF TYLENOL  (ACETAMINOPHEN ) IN 24 HOURS

## 2024-07-22 NOTE — Op Note (Addendum)
 Otolaryngology Operative note  MICAI APOLINAR Date/Time of Admission: 07/22/2024  9:58 AM  CSN: [id number];MRN:[id number]  DOB: 12-30-1945 Age: 78 y.o. Location: [facility] OR    Pre-Op Diagnosis: Lesion of vocal fold  Post-Op Diagnosis: Lesion of vocal fold  Procedure: Microlaryngoscopy with excision of vocal fold lesion using CO2 laser and operating microscope- 31541 Kenalog  injection with operating telescope - [phone]  Surgeon: Hadassah Parody, MD  Anesthesia type:  General  Anesthesiologist: Anesthesiologist: Dorethea Cordella SQUIBB, DO CRNA: Elby Raelene SAUNDERS, CRNA   Staff: Circulator: Richelle Newell SAUNDERS, RN; Slusser, Lacinda CROME, RN Scrub Person: Myrna Beulah BROCKS, RN Circulator Assistant: Sherrine Moats, RN  Implants: * No implants in log *  Specimens: ID Type Source Tests Collected by Time Destination  1 : Left vocal fold lesion Tissue Path Tissue SURGICAL PATHOLOGY Momina Hunton, Hadassah BROCKS, MD 07/22/2024 1331     EBL:  0 mL  Drains: none  Post-op disposition and condition: PACU, hemodynamically stable   Complications: None apparent  Indications and consent:  Daniel Cox is a 78 y.o. male with diagnoses above. The patient's options were discussed, including risks/benefits/alternatives for each option. Patient expressed understanding, and despite these risks, consented and decided to proceed with above procedures. Informed consent was signed before proceeding.  Procedure: Procedure: Suspension micro direct laryngoscopy with tracheoscopy - 68473 Microlaryngoscopy with excision of vocal fold lesion using CO2 laser and operating microscope- 31540 Kenalog  injection with operating telescope - [phone]     Findings: - Firm mass at anterior 1/3 of vocal fold, does not involve commissure       Complications: None apparent   Procedure: The patient was brought to the operating room. Intubation was performed by the anesthesia team using a laser safe endotracheal tube.    The head of  the bed was then turned 90 degrees. A pre-procedure time-out was performed. The patient's eyes were taped followed by saline soaked Raytec sponges on the eyes, followed by 3 silk tape to hold in place. A tooth guard was inserted to protect the maxillary dentition.    Exposure was achieved using a laser safe Sataloff larynoscope.  Once appropriate exposure was obtained the laryngoscope was placed into suspension. The 0-degree telescope was used to examine the subglottis and trachea and no additional lesions were identified.    The operating microscope was then brought into the field and the laser was attached. Saline soaked pledgets were placed subglottically to protect the endotracheal tube balloon. Wet towels were placed around the patients face to protect skin and lips. The laser was then turned on at 4 watts continuous mode. The lesion was grasped using triangular microlaryngeal forceps and pulled medially. The CO2 laser was used to excise the lesion. The lesion was passed off the field to be sent to pathology. The area was palpated and there was felt to be some residual lesion in this area. Due to concern this may be cancer decision was made to conclude the resection at this time until final pathology is available. The laser was placed on standby.    The saline soaked pledgets were removed and Afrin soaked pledgets were used to assist with hemostasis.    Photo documentation was obtained using the 0-degree operating telescope.    Kenalog  (1 cc) was injected into the wound bed using butterfly needle. The patient was hemostatic.    2% lidocaine  was sprayed on the cords.    The patient was then taken out of suspension and the laryngoscope removed, suctioning the pharynx on  the way out.    Care of the patient was then transferred to anesthesia. The patient was awakened extubated and transferred to PACU in stable condition.

## 2024-07-22 NOTE — H&P (Signed)
 Pre-Operative H&P - Day Of Surgery Patient Name: EUNICE OLDAKER Date:   07/22/2024  HPI: Jene is a 78 y.o. male who presents today for operative treatment of vocal fold lesion. Patient denies recent significant changes to health or significant new medications or physiologic change in condition which would immediately impact plans. No new types of therapy has been initiated that would change the plan or the appropriateness of the plan.   ROS:  A complete review of systems was obtained and is otherwise negative.  PMH:  Past Medical History:  Diagnosis Date   Chronic kidney disease    Coronary artery disease    Degenerative disc disease, cervical    Degenerative lumbar disc    Depression    Erectile dysfunction    History of kidney stones    Hyperlipidemia    Hypertension    Myocardial infarction [facility])    Sleep apnea    Stroke (HCC)     PSH:  Past Surgical History:  Procedure Laterality Date   Appendectony     COLONOSCOPY WITH PROPOFOL  N/A 08/16/2015   Procedure: COLONOSCOPY WITH PROPOFOL ;  Surgeon: Gladis MARLA Louder, MD;  Location: [facility] ENDOSCOPY;  Service: Endoscopy;  Laterality: N/A;   CORONARY/GRAFT ACUTE MI REVASCULARIZATION N/A 10/23/2020   Procedure: Coronary/Graft Acute MI Revascularization;  Surgeon: Court Dorn PARAS, MD;  Location: [facility] INVASIVE CV LAB;  Service: Cardiovascular;  Laterality: N/A;   LEFT HEART CATH AND CORONARY ANGIOGRAPHY N/A 10/23/2020   Procedure: LEFT HEART CATH AND CORONARY ANGIOGRAPHY;  Surgeon: Court Dorn PARAS, MD;  Location: [facility] INVASIVE CV LAB;  Service: Cardiovascular;  Laterality: N/A;   LEFT HEART CATHETERIZATION WITH CORONARY ANGIOGRAM N/A 02/06/2013   Procedure: LEFT HEART CATHETERIZATION WITH CORONARY ANGIOGRAM;  Surgeon: Victory LELON Claudene DOUGLAS, MD;  Location: [facility] CATH LAB;  Service: Cardiovascular;  Laterality: N/A;   PERCUTANEOUS CORONARY STENT INTERVENTION (PCI-S) N/A 02/10/2013   Procedure: PERCUTANEOUS CORONARY STENT INTERVENTION (PCI-S);   Surgeon: Victory LELON Claudene DOUGLAS, MD;  Location: [facility] CATH LAB;  Service: Cardiovascular;  Laterality: N/A;   Right shoulder      MEDS:   Current Facility-Administered Medications:    lactated ringers  infusion, , Intravenous, Continuous, Darlyn Rush, MD, Last Rate: 10 mL/hr at 07/22/24 1120, New Bag at 07/22/24 1120  ALLERGIES: Penicillins  EXAM: Vitals: BP 127/86   Pulse 68   Temp 97.9 F (36.6 C) (Oral)   Resp 18   Ht 5' 8 (1.727 m)   Wt 71.2 kg   BMI 23.87 kg/m   General Awake, at baseline alertness.   HEENT No scleral icterus or conjunctival hemorrhage. Globe position appears normal. External ears  normal. Nose patent without rhinorrhea. No lymphadenopathy. No thyromegaly dysphonic  Cardiovascular No cyanosis.  Pulmonary No audible stridor. Breathing easily with no labor.  Neuro Symmetric facial movement.   Psychiatry Appropriate affect and mood.  Skin No scars or lesions on face or neck.  Extermities Moves all extremities with normal range of motion.   Other Findings None.   Assessment & Plan: Kamin has diagnoses of vocal fold lesion and will go to the OR today for SMDL removal of lesion. Informed consent was obtained and available in EMR today. All questions have been answered, and risks/benefits/alternatives of procedure as noted in the consent were discussed in a quiet area. Questions were invited and answered. The patient expressed understanding, provided consent and wished to proceed despite risks.  Hadassah BROCKS Ariston Grandison 07/22/2024 12:49 PM

## 2024-07-22 NOTE — Transfer of Care (Signed)
 Immediate Anesthesia Transfer of Care Note  Patient: Daniel Cox  Procedure(s) Performed: MICROLARYNGOSCOPY, WITH PROCEDURE USING  CO2 LASER (Throat) INJECTION, TRIAMCINOLONE  (Throat)  Patient Location: PACU  Anesthesia Type:General  Level of Consciousness: drowsy  Airway & Oxygen Therapy: Patient Spontanous Breathing and Patient connected to face mask oxygen  Post-op Assessment: Report given to RN and Post -op Vital signs reviewed and stable  Post vital signs: Reviewed and stable  Last Vitals:  Vitals Value Taken Time  BP 151/95 07/22/24 13:55  Temp    Pulse 66 07/22/24 13:57  Resp 13 07/22/24 13:57  SpO2 99 % 07/22/24 13:57  Vitals shown include unfiled device data.  Last Pain:  Vitals:   07/22/24 1100  TempSrc:   PainSc: 0-No pain      Patients Stated Pain Goal: 0 (07/22/24 1100)  Complications: No notable events documented.

## 2024-07-22 NOTE — Anesthesia Procedure Notes (Signed)
 Procedure Name: Intubation Date/Time: 07/22/2024 1:12 PM  Performed by: Elby Raelene SAUNDERS, CRNAPre-anesthesia Checklist: Patient identified, Emergency Drugs available, Suction available and Patient being monitored Patient Re-evaluated:Patient Re-evaluated prior to induction Oxygen Delivery Method: Circle System Utilized Preoxygenation: Pre-oxygenation with 100% oxygen Induction Type: IV induction Ventilation: Mask ventilation without difficulty Laryngoscope Size: Miller and 2 Tube type: Reinforced Tube size: 7.0 mm Number of attempts: 1 Airway Equipment and Method: Stylet Placement Confirmation: ETT inserted through vocal cords under direct vision, positive ETCO2 and breath sounds checked- equal and bilateral Secured at: 23 cm Tube secured with: Tape Dental Injury: Teeth and Oropharynx as per pre-operative assessment

## 2024-07-22 NOTE — Anesthesia Preprocedure Evaluation (Addendum)
 Anesthesia Evaluation  Patient identified by MRN, date of birth, ID band Patient awake    Reviewed: Allergy & Precautions, NPO status , Patient's Chart, lab work & pertinent test results  Airway Mallampati: II  TM Distance: >3 FB Neck ROM: Full    Dental  (+) Missing, Dental Advisory Given, Poor Dentition, Loose,    Pulmonary sleep apnea and Continuous Positive Airway Pressure Ventilation , Current Smoker and Patient abstained from smoking., former smoker   Pulmonary exam normal breath sounds clear to auscultation       Cardiovascular hypertension, Pt. on medications and Pt. on home beta blockers + CAD, + Past MI, + Cardiac Stents and + Peripheral Vascular Disease  Normal cardiovascular exam Rhythm:Regular Rate:Normal  Echo 2022  1. Left ventricular ejection fraction, by estimation, is 60 to 65%. The  left ventricle has normal function. The left ventricle has no regional  wall motion abnormalities. Left ventricular diastolic parameters were  normal.   2. Right ventricular systolic function is normal. The right ventricular  size is normal.   3. The mitral valve is normal in structure. Trivial mitral valve  regurgitation. No evidence of mitral stenosis.   4. The aortic valve is tricuspid. Aortic valve regurgitation is not  visualized. Mild aortic valve sclerosis is present, with no evidence of  aortic valve stenosis.   5. Aortic dilatation noted. There is borderline dilatation of the aortic  root, measuring 39 mm.   6. The inferior vena cava is normal in size with greater than 50%  respiratory variability, suggesting right atrial pressure of 3 mmHg.     Cath 2022 Unsuccessful attempted PCI of high-grade ostial second obtuse marginal branch stenosis secondary to severe tortuosity.    Mid RCA lesion is 75% stenosed.  Dist RCA lesion is 80% stenosed.  Previously placed Prox LAD to Mid LAD stent (unknown type) is widely  patent.  1st Diag lesion is 90% stenosed.  Ost Cx to Prox Cx lesion is 75% stenosed.  1st Mrg lesion is 85% stenosed.  2nd Mrg lesion is 99% stenosed.    Neuro/Psych  PSYCHIATRIC DISORDERS  Depression    CVA    GI/Hepatic Neg liver ROS,neg GERD  ,,  Endo/Other  negative endocrine ROS    Renal/GU CRFRenal disease     Musculoskeletal  (+) Arthritis ,    Abdominal   Peds  Hematology negative hematology ROS (+)   Anesthesia Other Findings   Reproductive/Obstetrics                              Lab Results  Component Value Date   WBC 3.8 (L) 07/22/2024   HGB 13.1 07/22/2024   HCT 38.9 (L) 07/22/2024   MCV 100.8 (H) 07/22/2024   PLT 172 07/22/2024   Lab Results  Component Value Date   CREATININE 1.51 (H) 07/22/2024   BUN 16 07/22/2024   NA 140 07/22/2024   K 3.8 07/22/2024   CL 108 07/22/2024   CO2 22 07/22/2024   Lab Results  Component Value Date   INR 1.0 10/23/2020   INR 1.04 02/06/2013   INR 1.00 02/06/2013   01/2015: EKG: normal sinus rhythm.   Anesthesia Physical Anesthesia Plan  ASA: 3  Anesthesia Plan:    Post-op Pain Management: Minimal or no pain anticipated   Induction: Intravenous  PONV Risk Score and Plan: 2 and Ondansetron , Treatment may vary due to age or medical condition, Dexamethasone  and Propofol   infusion  Airway Management Planned:   Additional Equipment:   Intra-op Plan:   Post-operative Plan:   Informed Consent: I have reviewed the patients History and Physical, chart, labs and discussed the procedure including the risks, benefits and alternatives for the proposed anesthesia with the patient or authorized representative who has indicated his/her understanding and acceptance.     Dental advisory given  Plan Discussed with: CRNA  Anesthesia Plan Comments: (From cardiology note 02/2024 Daniel Cox is a 78 y.o. fit-appearing married African-American male father of 3 children, grandfather  of 12 grandchildren is retired from working for the city of [employer].  He is formally a patient of Dr. Theressa.  I am assuming his care.  His cardiac risk factors notable for ongoing tobacco abuse of 1 pack/week although he says he is trying to quit.  He has treated hypertension and hyperlipidemia.  Both parents died in their 58s of myocardial infarction's.  He had a non-STEMI back in 2014 and underwent cath by Dr. 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  He ultimately went femoral he placed a stent in his LAD.  He did have residual disease in his other vessels.  He presented with unstable angina 10/23/2020 and I cathed him via the femoral approach.  The culprit lesion was a marginal branch but I was unable to cannulate it because of tortuosity and elected to treat him medically.  His LAD stent was widely patent and he had normal LV function.  He is fairly active and has been asymptomatic.  He plays golf several days a week and walks.  History of CAD status post non-STEMI in 2014.  He was By Dr. Claudene initially radially and then fibrillate.  Apparently had an embolic stroke at that time.  Dr. Claudene placed a stent in his LAD.  He came back in with substernal chest pain.  He had lateral ST segment elevation 10/23/2020.  He was brought to the Cath Lab by myself and I cathed him via the femoral approach revealing a patent LAD stent, high-grade diagonal branch disease, distal RCA disease and first obtuse marginal branch disease.  I attempted PCI but was unable to cannulate the vessel because of tortuosity and aborted the procedure and elected to treat medically.  He is been asymptomatic since.  Risks of anesthesia explained at length. This includes, but is not limited to, sore throat, damage to teeth, lips gums, tongue and vocal cords, nausea and vomiting, reactions to medications, stroke, heart attack, and death. All patient questions were answered and the patient wishes to proceed.  )          Anesthesia Quick Evaluation

## 2024-07-22 NOTE — Progress Notes (Signed)
 Dr. Darlyn made aware that the patient stopped taking Metoprolol  last Thursday. No new orders received.

## 2024-07-23 ENCOUNTER — Ambulatory Visit (INDEPENDENT_AMBULATORY_CARE_PROVIDER_SITE_OTHER)

## 2024-07-23 ENCOUNTER — Encounter (HOSPITAL_COMMUNITY): Payer: Self-pay

## 2024-07-23 LAB — SURGICAL PATHOLOGY

## 2024-07-24 NOTE — Anesthesia Postprocedure Evaluation (Signed)
 Anesthesia Post Note  Patient: TOM MACPHERSON  Procedure(s) Performed: MICROLARYNGOSCOPY, WITH PROCEDURE USING  CO2 LASER (Throat) INJECTION, TRIAMCINOLONE  (Throat)     Patient location during evaluation: PACU Anesthesia Type: General Level of consciousness: awake and alert Pain management: pain level controlled Vital Signs Assessment: post-procedure vital signs reviewed and stable Respiratory status: spontaneous breathing, nonlabored ventilation, respiratory function stable and patient connected to nasal cannula oxygen Cardiovascular status: blood pressure returned to baseline and stable Postop Assessment: no apparent nausea or vomiting Anesthetic complications: no   No notable events documented.  Last Vitals:  Vitals:   07/22/24 1415 07/22/24 1430  BP: (!) 154/98 (!) 153/89  Pulse: 60 62  Resp: 16 16  Temp:  36.5 C  SpO2: 95% 95%    Last Pain:  Vitals:   07/22/24 1355  TempSrc:   PainSc: 0-No pain                 Cordella P Devery Murgia

## 2024-07-29 ENCOUNTER — Telehealth (INDEPENDENT_AMBULATORY_CARE_PROVIDER_SITE_OTHER): Payer: Self-pay

## 2024-07-29 DIAGNOSIS — C320 Malignant neoplasm of glottis: Secondary | ICD-10-CM

## 2024-07-29 NOTE — Telephone Encounter (Signed)
 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 Discussed options and offered to go ahead and set up rad/onc appt. He wants to wait to do this until we can talk in person at his f/u appt. For now going to go ahead and order CT neck and chest for staging. He understood  and agrees with plan.

## 2024-07-30 ENCOUNTER — Ambulatory Visit (INDEPENDENT_AMBULATORY_CARE_PROVIDER_SITE_OTHER)

## 2024-08-06 ENCOUNTER — Ambulatory Visit (INDEPENDENT_AMBULATORY_CARE_PROVIDER_SITE_OTHER)

## 2024-08-06 ENCOUNTER — Telehealth (INDEPENDENT_AMBULATORY_CARE_PROVIDER_SITE_OTHER): Payer: Self-pay

## 2024-08-06 ENCOUNTER — Encounter (INDEPENDENT_AMBULATORY_CARE_PROVIDER_SITE_OTHER): Payer: Self-pay

## 2024-08-06 DIAGNOSIS — C320 Malignant neoplasm of glottis: Secondary | ICD-10-CM

## 2024-08-06 NOTE — Telephone Encounter (Signed)
 Called pt again regarding his results- his next appt with me got pushed back. Explained to him I'd like him to go ahead and at least get referral to rad/onc so they can see him and discuss treatment options. I think he would be a candidate for this but will need to get his imaging. He is agreeable to go ahead and get radiation referral. Needs to schedule his imaging as well. He is agreeable to plan.

## 2024-08-06 NOTE — Telephone Encounter (Signed)
 Patient called back returning Dr. Cinthia phone call. Explained to patient Dr. Greggory was with a patient right now and would give him a call back as soon as she got a chance patient understood.

## 2024-08-08 ENCOUNTER — Telehealth: Payer: Self-pay | Admitting: Radiation Oncology

## 2024-08-08 NOTE — Telephone Encounter (Signed)
 12/12 @ 2:09 pm Left voicemail for patient to call our office to be sch for consult.

## 2024-08-13 NOTE — Progress Notes (Signed)
 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 [x]  []  Used to weight 171 now 151 over one year.  Swallowing concerns? []  [x]    PEG? []  [x]     Referrals Yes No Comments  Social Work? [x]  []    Dentistry? [x]  []    Swallowing therapy? [x]  []    Nutrition? [x]  []    Med/Onc? [x]  []     Safety Issues Yes No Comments  Prior radiation? []  [x]    Pacemaker/ICD? []  [x]    Possible current pregnancy? []  [x]    Is the patient on methotrexate? []  [x]     Tobacco/Marijuana/Snuff/ETOH use: Smoking since he was 78 years old   Past/Anticipated interventions by otolaryngology, if any:  07/22/2024 Masciello, MD    Current Complaints / other details:  None

## 2024-08-14 ENCOUNTER — Other Ambulatory Visit (INDEPENDENT_AMBULATORY_CARE_PROVIDER_SITE_OTHER): Payer: Self-pay

## 2024-08-14 ENCOUNTER — Ambulatory Visit (HOSPITAL_COMMUNITY)

## 2024-08-14 DIAGNOSIS — C320 Malignant neoplasm of glottis: Secondary | ICD-10-CM

## 2024-08-14 NOTE — Progress Notes (Incomplete)
 Radiation Oncology         [phone] ________________________________  Initial Outpatient Consultation  Name: Daniel Cox MRN: [id number]  Date: 08/15/2024  DOB: October 31, 1945  RR:Dyjt, Joen, MD  Masciello, Hadassah BROCKS, MD   REFERRING PHYSICIAN: Masciello, Hadassah BROCKS, MD  DIAGNOSIS: No diagnosis found.   Cancer Staging  No matching staging information was found for the patient.  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 male who presented with Dr. Janyce [facility]) on 07/07/24 for evaluation of vocal hoarseness x 2 months. A laryngoscopy was performed at that time which revealed a left anterior vocal fold lesion concerning for malignancy. No other abnormalities were visualized at that time via laryngoscopy or physical examination of the neck and head (including cervical lymphadenopathy).   He accordingly underwent a microlaryngoscopy for excision of the left focal cord lesion under anesthesia on 07/22/24. Pathology showed findings consistent with moderately differentiated invasive squamous cell carcinoma (keratinizing).   Imaging work-up including a soft tissue neck CT and chest CT were performed yesterday. Imaging results are pending at this time.   Swallowing issues, if any: none  Weight Changes: ***  Pain status: ***  Other symptoms: vocal hoarseness   Tobacco history, if any: history of smoking since the age of 15   ETOH abuse, if any: none  Prior cancers, if any: none  PREVIOUS RADIATION THERAPY: No  PAST MEDICAL HISTORY:  has a past medical history of Chronic kidney disease, Coronary artery disease, Degenerative disc disease, cervical, Degenerative lumbar disc, Depression, Erectile dysfunction, History of kidney stones, Hyperlipidemia, Hypertension, Myocardial infarction [facility]), Sleep apnea, and Stroke (HCC).     PAST SURGICAL HISTORY: Past Surgical History:  Procedure Laterality Date   Appendectony     COLONOSCOPY WITH PROPOFOL  N/A 08/16/2015   Procedure: COLONOSCOPY WITH PROPOFOL ;  Surgeon: Gladis MARLA Louder, MD;  Location: [facility];  Service: Endoscopy;  Laterality: N/A;   CORONARY/GRAFT ACUTE MI REVASCULARIZATION N/A 10/23/2020   Procedure: Coronary/Graft Acute MI Revascularization;  Surgeon: Court Dorn PARAS, MD;  Location: [facility] INVASIVE CV LAB;  Service: Cardiovascular;  Laterality: N/A;   KENALOG  INJECTION  07/22/2024   Procedure: INJECTION, TRIAMCINOLONE ;  Surgeon: Greggory Hadassah BROCKS, MD;  Location: [facility] OR;  Service: ENT;;   LEFT HEART CATH AND CORONARY ANGIOGRAPHY N/A 10/23/2020   Procedure: LEFT HEART CATH AND CORONARY ANGIOGRAPHY;  Surgeon: Court Dorn PARAS, MD;  Location: [facility] INVASIVE CV LAB;  Service: Cardiovascular;  Laterality: N/A;   LEFT HEART CATHETERIZATION WITH CORONARY ANGIOGRAM N/A 02/06/2013   Procedure: LEFT HEART CATHETERIZATION WITH CORONARY ANGIOGRAM;  Surgeon: Victory LELON Claudene DOUGLAS, MD;  Location: [facility] CATH LAB;  Service: Cardiovascular;  Laterality: N/A;   MICROLARYNGOSCOPY WITH LASER N/A 07/22/2024   Procedure: MICROLARYNGOSCOPY, WITH PROCEDURE USING  CO2 LASER;  Surgeon: Greggory Hadassah BROCKS, MD;  Location: [facility] OR;  Service: ENT;  Laterality: N/A;   PERCUTANEOUS CORONARY STENT INTERVENTION (PCI-S) N/A 02/10/2013   Procedure: PERCUTANEOUS CORONARY STENT INTERVENTION (PCI-S);  Surgeon: Victory LELON Claudene DOUGLAS, MD;  Location: [facility] CATH LAB;  Service: Cardiovascular;  Laterality: N/A;   Right shoulder      FAMILY HISTORY: family history includes Colon cancer in his sister; Diabetes in his sister; Heart attack in his brother, brother, father, and mother; Heart disease in his sister.  SOCIAL HISTORY:  reports that he has been smoking cigarettes. He  has a 15 pack-year smoking history. He has never used smokeless tobacco. He reports that he does not drink alcohol and does not use  drugs.  ALLERGIES: Penicillins  MEDICATIONS:  Current Outpatient Medications  Medication Sig Dispense Refill   aspirin  EC 81 MG tablet Take 1 tablet (81 mg total) by mouth daily. 90 tablet 3   atorvastatin  (LIPITOR ) 80 MG tablet Take 80 mg by mouth daily.     cetirizine (ZYRTEC) 10 MG tablet Take 10 mg by mouth daily.     cholecalciferol (VITAMIN D) 1000 UNITS tablet Take 1,000 Units by mouth every morning.     clopidogrel  (PLAVIX ) 75 MG tablet TAKE 1 TABLET BY MOUTH DAILY (Patient taking differently: Take 75 mg by mouth daily.) 90 tablet 0   docusate sodium (COLACE) 100 MG capsule Take 100 mg by mouth daily.     ezetimibe (ZETIA) 10 MG tablet Take 10 mg by mouth daily.     HYDROcodone -acetaminophen  (NORCO/VICODIN) 5-325 MG tablet Take 1 tablet by mouth every 6 (six) hours as needed for up to 12 doses for severe pain (pain score 7-10). 12 tablet 0   isosorbide  mononitrate (IMDUR ) 30 MG 24 hr tablet TAKE 1 TABLET BY MOUTH DAILY 90 tablet 0   metoprolol  succinate (TOPROL -XL) 25 MG 24 hr tablet TAKE 1 TABLET BY MOUTH DAILY 90 tablet 0   Multiple Vitamins-Minerals (MULTIVITAMIN WITH MINERALS) tablet Take 1 tablet by mouth every morning.     nitroGLYCERIN  (NITROSTAT ) 0.4 MG SL tablet Place 1 tablet (0.4 mg total) under the tongue every 5 (five) minutes as needed for chest pain. 25 tablet 7   NON FORMULARY CPAP Machine - Use as directed at bedtime.     polyvinyl alcohol (LIQUIFILM TEARS) 1.4 % ophthalmic solution Place 1 drop into both eyes as needed for dry eyes. (Patient not taking: Reported on 07/22/2024)     pregabalin  (LYRICA ) 75 MG capsule Take 150 mg by mouth 2 (two) times daily.     venlafaxine XR (EFFEXOR-XR) 150 MG 24 hr capsule Take 150 mg by mouth as needed. TAKE ONE CAPSULE BY MOUTH DAILY FOR MOOD     No current facility-administered medications for this encounter.    REVIEW OF SYSTEMS:  Notable for that above.   PHYSICAL EXAM:  vitals were not taken for this visit.   General:  Alert and oriented, in no acute distress HEENT: Head is normocephalic. Extraocular movements are intact. Oropharynx is notable for ***. Neck: Neck is notable for *** Heart: Regular in rate and rhythm with no murmurs, rubs, or gallops. Chest: Clear to auscultation bilaterally, with no rhonchi, wheezes, or rales. Abdomen: Soft, nontender, nondistended, with no rigidity or guarding. Extremities: No cyanosis or edema. Lymphatics: see Neck Exam Skin: No concerning lesions. Musculoskeletal: symmetric strength and muscle tone throughout. Neurologic: Cranial nerves II through XII are grossly intact. No obvious focalities. Speech is fluent. Coordination is intact. Psychiatric: Judgment and insight are intact. Affect is appropriate.   ECOG = ***  0 - Asymptomatic (Fully active, able to carry on all predisease activities without restriction)  1 - Symptomatic but completely ambulatory (Restricted in physically strenuous activity but ambulatory and able to carry out work of a light or sedentary nature. For example, light housework, office work)  2 - Symptomatic, <50% in bed during the day (Ambulatory and capable of all self care but unable to carry out any work activities. Up and about more than 50% of waking hours)  3 - Symptomatic, >50% in bed,  but not bedbound (Capable of only limited self-care, confined to bed or chair 50% or more of waking hours)  4 - Bedbound (Completely disabled. Cannot carry on any self-care. Totally confined to bed or chair)  5 - Death   Raylene MM, Creech RH, Tormey DC, et al. [phone]). Toxicity and response criteria of the [facility]. Am. DOROTHA Bridges. Oncol. 5 (6): 649-55   LABORATORY DATA:  Lab Results  Component Value Date   WBC 3.8 (L) 07/22/2024   HGB 13.1 07/22/2024   HCT 38.9 (L) 07/22/2024   MCV 100.8 (H) 07/22/2024   PLT 172 07/22/2024   CMP     Component Value Date/Time   NA 140 07/22/2024 1055   NA 140 11/11/2020 1441   K 3.8  07/22/2024 1055   CL 108 07/22/2024 1055   CO2 22 07/22/2024 1055   GLUCOSE 82 07/22/2024 1055   BUN 16 07/22/2024 1055   BUN 18 11/11/2020 1441   CREATININE 1.51 (H) 07/22/2024 1055   CALCIUM  8.9 07/22/2024 1055   PROT 8.4 (H) 01/07/2024 1746   PROT 7.0 06/12/2017 0911   ALBUMIN 4.5 01/07/2024 1746   ALBUMIN 4.2 06/12/2017 0911   AST 33 01/07/2024 1746   ALT 21 01/07/2024 1746   ALKPHOS 130 (H) 01/07/2024 1746   BILITOT 1.1 01/07/2024 1746   BILITOT 0.7 06/12/2017 0911   GFRNONAA 47 (L) 07/22/2024 1055   GFRAA 49 (L) 04/25/2015 1036      Lab Results  Component Value Date   TSH 0.924 02/04/2013     RADIOGRAPHY: No results found.    IMPRESSION/PLAN:  This is a delightful patient with head and neck cancer. I *** recommend radiotherapy for this patient.  We discussed the potential risks, benefits, and side effects of radiotherapy. We talked in detail about acute and late effects. We discussed that some of the most bothersome acute effects may be mucositis, dysgeusia, salivary changes, skin irritation, hair loss, dehydration, weight loss and fatigue. We talked about late effects which include but are not necessarily limited to dysphagia, hypothyroidism, nerve injury, vascular injury, spinal cord injury, xerostomia, trismus, neck edema, dental issues, non-healing wound, and potentially fatal injury to any of the tissues in the head and neck region. No guarantees of treatment were given. A consent form was signed and placed in the patient's medical record. The patient is enthusiastic about proceeding with treatment. I look forward to participating in the patient's care.    Simulation (treatment planning) will take place ***  We also discussed that the treatment of head and neck cancer is a multidisciplinary process to maximize treatment outcomes and quality of life. For this reason the following referrals have been or will be made:  *** Medical oncology to discuss chemotherapy   ***  Dentistry for dental evaluation, possible extractions in the radiation fields, and /or advice on reducing risk of cavities, osteoradionecrosis, or other oral issues.  *** Nutritionist for nutrition support during and after treatment.  *** Speech language pathology for swallowing and/or speech therapy.  *** Social work for social support.   *** Physical therapy due to risk of lymphedema in neck and deconditioning.  *** Baseline labs including TSH.  On date of service, in total, I spent *** minutes on this encounter. Patient was seen in person.  __________________________________________   Lauraine Golden, MD  This document serves as a record of services personally performed by Lauraine Golden, MD. It was created on her behalf by Dorthy Fuse, a trained  medical scribe. The creation of this record is based on the scribe's personal observations and the provider's statements to them. This document has been checked and approved by the attending provider.

## 2024-08-15 ENCOUNTER — Ambulatory Visit
Admission: RE | Admit: 2024-08-15 | Discharge: 2024-08-15 | Disposition: A | Discharge status: Home / Self Care | Source: Ambulatory Visit | Attending: Radiation Oncology | Admitting: Radiation Oncology

## 2024-08-15 ENCOUNTER — Ambulatory Visit (INDEPENDENT_AMBULATORY_CARE_PROVIDER_SITE_OTHER)

## 2024-08-15 ENCOUNTER — Encounter: Payer: Self-pay | Admitting: Radiation Oncology

## 2024-08-15 VITALS — BP 122/73 | HR 70 | Temp 97.7°F | Resp 20 | Ht 68.0 in | Wt 151.0 lb

## 2024-08-15 DIAGNOSIS — C320 Malignant neoplasm of glottis: Secondary | ICD-10-CM | POA: Insufficient documentation

## 2024-08-15 DIAGNOSIS — E785 Hyperlipidemia, unspecified: Secondary | ICD-10-CM | POA: Insufficient documentation

## 2024-08-15 DIAGNOSIS — Z800 Family history of malignant neoplasm of digestive organs: Secondary | ICD-10-CM | POA: Diagnosis not present

## 2024-08-15 DIAGNOSIS — N189 Chronic kidney disease, unspecified: Secondary | ICD-10-CM | POA: Diagnosis not present

## 2024-08-15 DIAGNOSIS — I129 Hypertensive chronic kidney disease with stage 1 through stage 4 chronic kidney disease, or unspecified chronic kidney disease: Secondary | ICD-10-CM | POA: Diagnosis not present

## 2024-08-15 DIAGNOSIS — Z7982 Long term (current) use of aspirin: Secondary | ICD-10-CM | POA: Insufficient documentation

## 2024-08-15 DIAGNOSIS — G4730 Sleep apnea, unspecified: Secondary | ICD-10-CM | POA: Diagnosis not present

## 2024-08-15 DIAGNOSIS — Z7902 Long term (current) use of antithrombotics/antiplatelets: Secondary | ICD-10-CM | POA: Insufficient documentation

## 2024-08-15 DIAGNOSIS — Z79899 Other long term (current) drug therapy: Secondary | ICD-10-CM | POA: Diagnosis not present

## 2024-08-15 DIAGNOSIS — I252 Old myocardial infarction: Secondary | ICD-10-CM | POA: Diagnosis not present

## 2024-08-15 DIAGNOSIS — Z8673 Personal history of transient ischemic attack (TIA), and cerebral infarction without residual deficits: Secondary | ICD-10-CM | POA: Insufficient documentation

## 2024-08-15 DIAGNOSIS — Z87442 Personal history of urinary calculi: Secondary | ICD-10-CM | POA: Insufficient documentation

## 2024-08-15 DIAGNOSIS — F17210 Nicotine dependence, cigarettes, uncomplicated: Secondary | ICD-10-CM | POA: Insufficient documentation

## 2024-08-15 DIAGNOSIS — I2510 Atherosclerotic heart disease of native coronary artery without angina pectoris: Secondary | ICD-10-CM | POA: Insufficient documentation

## 2024-08-17 DIAGNOSIS — C320 Malignant neoplasm of glottis: Secondary | ICD-10-CM | POA: Insufficient documentation

## 2024-08-18 NOTE — Progress Notes (Signed)
 Oncology Nurse Navigator Documentation   Met with patient during initial consult with Dr. Izell. He was accompanied by his wife, Daniel Cox . I introduced myself as his/their Navigator, explained my role as a member of the Care Team. Provided New Patient resource guide binder: Contact information for physicians, this navigator, other members of the Care Team Advance Directive information; provided [facility] AD booklet at their request,  Fall Prevention Patient Safety Plan Financial Assistance Information sheet Symptom Management Clinic information [facility] campus map with highlight of [facility] SLP Information sheet Head and Neck cancer basics Nutrition information Patient and family support information including Spiritual care/Chaplain information, Peer mentor program, health and wellness classes, and the survivorship program Community resources  Assisted with post-consult appt scheduling. They verbalized understanding of information provided. I encouraged them to call with questions/concerns moving forward.  [name], RN, BSN, OCN Head & Neck Oncology Nurse Navigator [facility] [phone]

## 2024-08-25 ENCOUNTER — Ambulatory Visit (INDEPENDENT_AMBULATORY_CARE_PROVIDER_SITE_OTHER)

## 2024-08-27 ENCOUNTER — Ambulatory Visit (HOSPITAL_COMMUNITY)
Admission: RE | Admit: 2024-08-27 | Discharge: 2024-08-27 | Disposition: A | Discharge status: Home / Self Care | Source: Ambulatory Visit

## 2024-08-27 DIAGNOSIS — C320 Malignant neoplasm of glottis: Secondary | ICD-10-CM | POA: Diagnosis present

## 2024-08-27 MED ORDER — IOHEXOL 300 MG/ML  SOLN
75.0000 mL | Freq: Once | INTRAMUSCULAR | Status: AC | PRN
  Administered 2024-08-27: 75 mL via INTRAVENOUS

## 2024-09-05 ENCOUNTER — Ambulatory Visit (INDEPENDENT_AMBULATORY_CARE_PROVIDER_SITE_OTHER)

## 2024-09-05 ENCOUNTER — Encounter (INDEPENDENT_AMBULATORY_CARE_PROVIDER_SITE_OTHER): Payer: Self-pay

## 2024-09-05 VITALS — BP 129/84 | HR 68 | Temp 97.4°F

## 2024-09-05 DIAGNOSIS — C320 Malignant neoplasm of glottis: Secondary | ICD-10-CM

## 2024-09-07 NOTE — Progress Notes (Signed)
 Dear Dr. Loreli, Here is my assessment for our mutual patient, Daniel Cox. Thank you for allowing me the opportunity to care for your patient. Please do not hesitate to contact me should you have any other questions. Sincerely, Dr. Hadassah Parody  Otolaryngology Clinic Note Referring provider: Dr. Loreli HPI:   Initial HPI (07/07/2024)  Daniel Cox is a 79 year old male with a history of smoking since he was 89 who presents with hoarseness for two months.  Hoarseness - Hoarseness has persisted for nearly two months - Onset occurred approximately one week after receiving a flu vaccination - No associated throat pain, ear pain, or palpable neck masses  Tobacco use - Significant history of tobacco use  Cardiovascular history - Current medications include aspirin  and Plavix , follows with cardiology   --------------------------------------------------------- 09/05/2024  Taken to the OR on 07/22/2024 for direct laryngoscopy with biopsy.  Pathology showed invasive squamous cell carcinoma.  He saw radiation oncology and is considering going radiation route for management of his cancer.  Today he is reporting persistent hoarseness but he is having no pain.  We discussed further surgical resection versus radiation for management of his cancer and he would like to go with radiation.    Independent Review of Additional Tests or Records:  Referral 06/20/24 Daniel Loreli, MD: hoarse voice over last month. Smoking since he was 79  years old.   01/07/24 CBC: Hgb 14.8  CT neck 08/27/2024 independently reviewed showing no obvious residual tumor and no significant neck lymphadenopathy  PMH/Meds/All/SocHx/FamHx/ROS:   Past Medical History:  Diagnosis Date   Chronic kidney disease    Coronary artery disease    Degenerative disc disease, cervical    Degenerative lumbar disc    Depression    Erectile dysfunction    History of kidney stones    Hyperlipidemia    Hypertension     Myocardial infarction [facility])    Sleep apnea    Stroke [facility])      Past Surgical History:  Procedure Laterality Date   Appendectony     COLONOSCOPY WITH PROPOFOL  N/A 08/16/2015   Procedure: COLONOSCOPY WITH PROPOFOL ;  Surgeon: Gladis MARLA Louder, MD;  [facility]: [facility];  Service: Endoscopy;  Laterality: N/A;   CORONARY/GRAFT ACUTE MI REVASCULARIZATION N/A 10/23/2020   Procedure: Coronary/Graft Acute MI Revascularization;  Surgeon: Court Dorn PARAS, MD;  Location: [facility] INVASIVE CV LAB;  Service: Cardiovascular;  Laterality: N/A;   KENALOG  INJECTION  07/22/2024   Procedure: INJECTION, TRIAMCINOLONE ;  Surgeon: Parody Hadassah BROCKS, MD;  Location: [facility] OR;  Service: ENT;;   LEFT HEART CATH AND CORONARY ANGIOGRAPHY N/A 10/23/2020   Procedure: LEFT HEART CATH AND CORONARY ANGIOGRAPHY;  Surgeon: Court Dorn PARAS, MD;  Location: [facility] INVASIVE CV LAB;  Service: Cardiovascular;  Laterality: N/A;   LEFT HEART CATHETERIZATION WITH CORONARY ANGIOGRAM N/A 02/06/2013   Procedure: LEFT HEART CATHETERIZATION WITH CORONARY ANGIOGRAM;  Surgeon: Victory LELON Claudene DOUGLAS, MD;  Location: [facility] CATH LAB;  Service: Cardiovascular;  Laterality: N/A;   MICROLARYNGOSCOPY WITH LASER N/A 07/22/2024   Procedure: MICROLARYNGOSCOPY, WITH PROCEDURE USING  CO2 LASER;  Surgeon: Parody Hadassah BROCKS, MD;  Location: [facility] OR;  Service: ENT;  Laterality: N/A;   PERCUTANEOUS CORONARY STENT INTERVENTION (PCI-S) N/A 02/10/2013   Procedure: PERCUTANEOUS CORONARY STENT INTERVENTION (PCI-S);  Surgeon: Victory LELON Claudene DOUGLAS, MD;  Location: [facility] CATH LAB;  Service: Cardiovascular;  Laterality: N/A;   Right shoulder      Family History  Problem Relation Age of Onset   Heart  attack Mother    Heart attack Father    Heart disease Sister    Heart attack Brother    Heart attack Brother    Diabetes Sister    Colon cancer Sister      Social Connections: Unknown (01/09/2022)   Received [facility]   Social Network    Social Network: Not on file      Current Outpatient Medications  Medication Instructions   aspirin  EC 81 mg, Oral, Daily   atorvastatin  (LIPITOR ) 80 mg, Daily   cetirizine (ZYRTEC) 10 mg, Daily   cholecalciferol (VITAMIN D) 1,000 Units, Every morning   clopidogrel  (PLAVIX ) 75 MG tablet TAKE 1 TABLET BY MOUTH DAILY   docusate sodium (COLACE) 100 mg, Daily   ezetimibe (ZETIA) 10 mg, Daily   HYDROcodone -acetaminophen  (NORCO/VICODIN) 5-325 MG tablet 1 tablet, Oral, Every 6 hours PRN   isosorbide  mononitrate (IMDUR ) 30 MG 24 hr tablet TAKE 1 TABLET BY MOUTH DAILY   metoprolol  succinate (TOPROL -XL) 25 MG 24 hr tablet TAKE 1 TABLET BY MOUTH DAILY   Multiple Vitamins-Minerals (MULTIVITAMIN WITH MINERALS) tablet 1 tablet, Every morning   nitroGLYCERIN  (NITROSTAT ) 0.4 mg, Sublingual, Every 5 min PRN   NON FORMULARY CPAP Machine - Use as directed at bedtime.    polyvinyl alcohol (LIQUIFILM TEARS) 1.4 % ophthalmic solution 1 drop, As needed   pregabalin  (LYRICA ) 150 mg, 2 times daily   venlafaxine XR (EFFEXOR-XR) 150 mg, As needed     Physical Exam:   BP 129/84   Pulse 68   Temp (!) 97.4 F (36.3 C)   SpO2 96%   Salient findings:  CN II-XII intact Bilateral EAC clear and TM intact with well pneumatized middle ear spaces No lesions of oral cavity/oropharynx No obviously palpable neck masses/lymphadenopathy/thyromegaly No respiratory distress or stridor Dysphonic  Seprately Identifiable Procedures:  Prior to initiating any procedures, risks/benefits/alternatives were explained to the patient and verbal consent obtained. none   Impression & Plans:  Daniel Cox is a 79 y.o. male with   1. 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  Imaging shows no lymph node involvement.  Discussed his case with Lauraine Golden, MD with radiation oncology.  Radiation is a  good option for him.  Discussed options for management and he would like to pursue radiation therapy.  - Scheduled follow-up a few months after completion of radiation for surveillance  Risks include pain, bleeding, infection, need for repeat procedures, airway compromise, failure to diagnose. He would like to proceed.   See below regarding exact medications prescribed this encounter including dosages and route: No orders of the defined types were placed in this encounter.   Thank you for allowing me the opportunity to care for your patient. Please do not hesitate to contact me should you have any other questions.  Sincerely, Hadassah Parody, MD Otolaryngologist (ENT), [facility] Phone: [phone] Fax: [phone]  MDM:  Level 5 Complexity/Problems addressed: 5-illness that poses a threat to life Data complexity: 5- independent review of CT scan, discussed treatment options with external healthcare provider - Morbidity:   - Prescription Drug prescribed or managed:

## 2024-09-12 ENCOUNTER — Ambulatory Visit
Admission: RE | Admit: 2024-09-12 | Discharge: 2024-09-12 | Disposition: A | Discharge status: Home / Self Care | Source: Ambulatory Visit | Attending: Radiation Oncology | Admitting: Radiation Oncology

## 2024-09-12 DIAGNOSIS — Z510 Encounter for antineoplastic radiation therapy: Secondary | ICD-10-CM | POA: Insufficient documentation

## 2024-09-12 DIAGNOSIS — Z79899 Other long term (current) drug therapy: Secondary | ICD-10-CM | POA: Insufficient documentation

## 2024-09-12 DIAGNOSIS — C320 Malignant neoplasm of glottis: Secondary | ICD-10-CM | POA: Insufficient documentation

## 2024-09-12 DIAGNOSIS — F17210 Nicotine dependence, cigarettes, uncomplicated: Secondary | ICD-10-CM | POA: Insufficient documentation

## 2024-09-12 NOTE — Progress Notes (Signed)
 Oncology Nurse Navigator Documentation   To provide support, encouragement and care continuity, met with Mr. Wenker after his CT SIM. He tolerated procedure without difficulty, denied questions/concerns.   I explained procedures for lobby registration, arrival to Radiation Waiting, and arrival to treatment area.  He voiced understanding.   I encouraged him to call me prior to 09/17/24 [facility] if he had any questions or concerns.  [name] RN, BSN, OCN Head & Neck Oncology Nurse Navigator [facility] at [facility] Phone # [phone]  Fax # [phone]

## 2024-09-15 ENCOUNTER — Other Ambulatory Visit: Payer: Self-pay

## 2024-09-15 DIAGNOSIS — C320 Malignant neoplasm of glottis: Secondary | ICD-10-CM

## 2024-09-16 ENCOUNTER — Telehealth: Payer: Self-pay

## 2024-09-16 NOTE — Telephone Encounter (Signed)
 [facility] Clinical Social Work  Clinical Social Work was referred by statistician for assessment of psychosocial needs.  Clinical Social Worker attempted to contact patient by phone to offer support and assess for needs.  No Answer, unable to leave Vm. Will attempt again.   Lizbeth Sprague, LCSW  Clinical Social Worker [facility]

## 2024-09-17 ENCOUNTER — Telehealth: Payer: Self-pay

## 2024-09-17 ENCOUNTER — Other Ambulatory Visit: Payer: Self-pay

## 2024-09-17 ENCOUNTER — Ambulatory Visit
Admission: RE | Admit: 2024-09-17 | Discharge: 2024-09-17 | Disposition: A | Source: Ambulatory Visit | Attending: Radiation Oncology | Admitting: Radiation Oncology

## 2024-09-17 DIAGNOSIS — C320 Malignant neoplasm of glottis: Secondary | ICD-10-CM

## 2024-09-17 LAB — RAD ONC ARIA SESSION SUMMARY
Course Elapsed Days: 0
Plan Fractions Treated to Date: 1
Plan Prescribed Dose Per Fraction: 2.25 Gy
Plan Total Fractions Prescribed: 28
Plan Total Prescribed Dose: 63 Gy
Reference Point Dosage Given to Date: 2.25 Gy
Reference Point Session Dosage Given: 2.25 Gy
Session Number: 1

## 2024-09-17 NOTE — Progress Notes (Signed)
 Oncology Nurse Navigator Documentation   To provide support, encouragement and care continuity, met with Daniel Cox for his initial RT.   Daniel Cox completed treatment without difficulty, denied questions/concerns. I reviewed the registration/arrival procedure for subsequent treatments. I discussed that Dr. Izell would like a TSH lab drawn on him and he agreed to stay after his radiation tomorrow to get the lab test.  I encouraged him to call me with questions/concerns as treatments proceed.   [name] RN, BSN, OCN Head & Neck Oncology Nurse Navigator [facility] at [facility] Phone # [phone]  Fax # [phone]

## 2024-09-17 NOTE — Telephone Encounter (Signed)
 Clinical Social Work was referred by nurse navigator for assessment of psychosocial needs.  This is second attempt to contact patient by phone to offer support and assess for needs.  CSW Intern left VM with direct contact encouraging patient to call back. Will attempt again.  Thersia Daring Clinical Social Work Intern [facility] [facility]

## 2024-09-18 ENCOUNTER — Ambulatory Visit
Admission: RE | Admit: 2024-09-18 | Discharge: 2024-09-18 | Disposition: A | Discharge status: Home / Self Care | Source: Ambulatory Visit | Attending: Radiation Oncology | Admitting: Radiation Oncology

## 2024-09-18 ENCOUNTER — Other Ambulatory Visit: Payer: Self-pay

## 2024-09-18 DIAGNOSIS — C320 Malignant neoplasm of glottis: Secondary | ICD-10-CM

## 2024-09-18 LAB — RAD ONC ARIA SESSION SUMMARY
Course Elapsed Days: 1
Plan Fractions Treated to Date: 2
Plan Prescribed Dose Per Fraction: 2.25 Gy
Plan Total Fractions Prescribed: 28
Plan Total Prescribed Dose: 63 Gy
Reference Point Dosage Given to Date: 4.5 Gy
Reference Point Session Dosage Given: 2.25 Gy
Session Number: 2

## 2024-09-18 LAB — TSH: TSH: 0.945 u[IU]/mL (ref 0.350–4.500)

## 2024-09-19 ENCOUNTER — Inpatient Hospital Stay: Admitting: Dietician

## 2024-09-19 ENCOUNTER — Ambulatory Visit
Admission: RE | Admit: 2024-09-19 | Discharge: 2024-09-19 | Disposition: A | Source: Ambulatory Visit | Attending: Radiation Oncology

## 2024-09-19 ENCOUNTER — Other Ambulatory Visit: Payer: Self-pay

## 2024-09-19 ENCOUNTER — Telehealth: Payer: Self-pay | Admitting: Dietician

## 2024-09-19 LAB — RAD ONC ARIA SESSION SUMMARY
Course Elapsed Days: 2
Plan Fractions Treated to Date: 3
Plan Prescribed Dose Per Fraction: 2.25 Gy
Plan Total Fractions Prescribed: 28
Plan Total Prescribed Dose: 63 Gy
Reference Point Dosage Given to Date: 6.75 Gy
Reference Point Session Dosage Given: 2.25 Gy
Session Number: 3

## 2024-09-19 NOTE — Telephone Encounter (Signed)
 Attempted to reach patient via telephone for nutrition assessment. Patient did not answer. Left VM with contact information. Next appointment scheduled 1/30 via telephone.

## 2024-09-22 ENCOUNTER — Ambulatory Visit

## 2024-09-23 ENCOUNTER — Other Ambulatory Visit: Payer: Self-pay

## 2024-09-23 ENCOUNTER — Ambulatory Visit
Admission: RE | Admit: 2024-09-23 | Discharge: 2024-09-23 | Disposition: A | Source: Ambulatory Visit | Attending: Radiation Oncology

## 2024-09-23 ENCOUNTER — Ambulatory Visit

## 2024-09-23 LAB — RAD ONC ARIA SESSION SUMMARY
Course Elapsed Days: 6
Plan Fractions Treated to Date: 4
Plan Prescribed Dose Per Fraction: 2.25 Gy
Plan Total Fractions Prescribed: 28
Plan Total Prescribed Dose: 63 Gy
Reference Point Dosage Given to Date: 9 Gy
Reference Point Session Dosage Given: 2.25 Gy
Session Number: 4

## 2024-09-24 ENCOUNTER — Other Ambulatory Visit: Payer: Self-pay

## 2024-09-24 ENCOUNTER — Ambulatory Visit
Admission: RE | Admit: 2024-09-24 | Discharge: 2024-09-24 | Disposition: A | Source: Ambulatory Visit | Attending: Radiation Oncology

## 2024-09-24 LAB — RAD ONC ARIA SESSION SUMMARY
Course Elapsed Days: 7
Plan Fractions Treated to Date: 5
Plan Prescribed Dose Per Fraction: 2.25 Gy
Plan Total Fractions Prescribed: 28
Plan Total Prescribed Dose: 63 Gy
Reference Point Dosage Given to Date: 11.25 Gy
Reference Point Session Dosage Given: 2.25 Gy
Session Number: 5

## 2024-09-25 ENCOUNTER — Ambulatory Visit
Admission: RE | Admit: 2024-09-25 | Discharge: 2024-09-25 | Disposition: A | Discharge status: Home / Self Care | Source: Ambulatory Visit | Attending: Radiation Oncology | Admitting: Radiation Oncology

## 2024-09-25 ENCOUNTER — Other Ambulatory Visit: Payer: Self-pay

## 2024-09-25 LAB — RAD ONC ARIA SESSION SUMMARY
Course Elapsed Days: 8
Plan Fractions Treated to Date: 6
Plan Prescribed Dose Per Fraction: 2.25 Gy
Plan Total Fractions Prescribed: 28
Plan Total Prescribed Dose: 63 Gy
Reference Point Dosage Given to Date: 13.5 Gy
Reference Point Session Dosage Given: 2.25 Gy
Session Number: 6

## 2024-09-26 ENCOUNTER — Ambulatory Visit
Admission: RE | Admit: 2024-09-26 | Discharge: 2024-09-26 | Disposition: A | Source: Ambulatory Visit | Attending: Radiation Oncology

## 2024-09-26 ENCOUNTER — Inpatient Hospital Stay

## 2024-09-26 ENCOUNTER — Telehealth: Payer: Self-pay | Admitting: Dietician

## 2024-09-26 ENCOUNTER — Other Ambulatory Visit: Payer: Self-pay

## 2024-09-26 ENCOUNTER — Inpatient Hospital Stay: Admitting: Dietician

## 2024-09-26 LAB — RAD ONC ARIA SESSION SUMMARY
Course Elapsed Days: 9
Plan Fractions Treated to Date: 7
Plan Prescribed Dose Per Fraction: 2.25 Gy
Plan Total Fractions Prescribed: 28
Plan Total Prescribed Dose: 63 Gy
Reference Point Dosage Given to Date: 15.75 Gy
Reference Point Session Dosage Given: 2.25 Gy
Session Number: 7

## 2024-09-26 NOTE — Telephone Encounter (Signed)
 Nutrition Assessment   Reason for Assessment: HNC   ASSESSMENT: 79 year old male with larynx cancer. He is receiving radiation therapy under the care of Dr. Izell. Final RT planned 3/2  Past medical history includes acute coronary syndrome, cerebral embolism, HTN, STEMI, NSTEMI, CKD3, HLD, OSA, tobacco use  Spoke with patient via telephone. He reports good appetite. Eating a couple times a day as usual. Recalls sausage egg/cheese biscuit for breakfast. Had salad for supper (lettuce, tom, eggs, cheese). Patient does not drink much water, but is working on this. Denies NIS at this time.   Nutrition Focused Physical Exam: deferred (telephone visit)   Medications: lipitor , vit D, colace, norco, imdur , toprol , MVI, lyrica , effexor, zetia, plavix    Labs: no recent labs for review    Anthropometrics:   Height: 5'8 Weight: 164 lb (1/27 - aria) UBW: 155-160 lb per pt  BMI: 24.9   NUTRITION DIAGNOSIS: Food and nutrition related knowledge deficit related to cancer as evidenced by no prior need for associated nutrition information    INTERVENTION:  Discussed importance of adequate calorie/protein energy intake to maintain strength/weights during treatment as well as support healing post treatment  Encourage incorporating snacks in between meals - snack ideas provided Discussed foods with protein, recommend protein source at every meal - soft moist high protein foods list provided Continue working to increase water Suggested trying ONS for added calories and protein - samples + coupons provided Samples, handouts, contact information left in treatment area - RT to provide at appt today  MONITORING, EVALUATION, GOAL: wt trends, intake   Next Visit: Thursday February 5 via telephone - pt aware

## 2024-09-26 NOTE — Progress Notes (Signed)
 [facility] Clinical Social Work  Clinical Social Work was referred by medical provider for assessment of psychosocial needs.  Clinical Social Work Intern contacted patient by phone to offer support and assess for needs.   Patient denies material or emotional needs at this time, but offered appreciation for the call.  Interventions: Discussed common feeling and emotions when being diagnosed with cancer, and the importance of support during treatment Informed patient of the support team roles and support services at [facility] Provided CSW contact information and encouraged patient to call with any questions or concerns      Follow Up Plan:  Patient will contact CSW with any support or resource needs    Thersia KATHEE Daring  Clinical Social Work Intern [facility]

## 2024-09-29 ENCOUNTER — Ambulatory Visit

## 2024-09-30 ENCOUNTER — Other Ambulatory Visit: Payer: Self-pay | Admitting: Radiation Oncology

## 2024-09-30 ENCOUNTER — Ambulatory Visit
Admission: RE | Admit: 2024-09-30 | Discharge: 2024-09-30 | Disposition: A | Source: Ambulatory Visit | Attending: Radiation Oncology

## 2024-09-30 ENCOUNTER — Other Ambulatory Visit: Payer: Self-pay

## 2024-09-30 DIAGNOSIS — C320 Malignant neoplasm of glottis: Secondary | ICD-10-CM

## 2024-09-30 LAB — RAD ONC ARIA SESSION SUMMARY
Course Elapsed Days: 13
Plan Fractions Treated to Date: 8
Plan Prescribed Dose Per Fraction: 2.25 Gy
Plan Total Fractions Prescribed: 28
Plan Total Prescribed Dose: 63 Gy
Reference Point Dosage Given to Date: 18 Gy
Reference Point Session Dosage Given: 2.25 Gy
Session Number: 8

## 2024-09-30 MED ORDER — LIDOCAINE VISCOUS HCL 2 % MT SOLN: OROMUCOSAL | 3 refills | Status: AC

## 2024-10-01 ENCOUNTER — Other Ambulatory Visit: Payer: Self-pay

## 2024-10-01 ENCOUNTER — Ambulatory Visit
Admission: RE | Admit: 2024-10-01 | Discharge: 2024-10-01 | Disposition: A | Source: Ambulatory Visit | Attending: Radiation Oncology

## 2024-10-01 LAB — RAD ONC ARIA SESSION SUMMARY
Course Elapsed Days: 14
Plan Fractions Treated to Date: 9
Plan Prescribed Dose Per Fraction: 2.25 Gy
Plan Total Fractions Prescribed: 28
Plan Total Prescribed Dose: 63 Gy
Reference Point Dosage Given to Date: 20.25 Gy
Reference Point Session Dosage Given: 2.25 Gy
Session Number: 9

## 2024-10-02 ENCOUNTER — Ambulatory Visit

## 2024-10-02 ENCOUNTER — Other Ambulatory Visit: Payer: Self-pay

## 2024-10-02 ENCOUNTER — Ambulatory Visit
Admission: RE | Admit: 2024-10-02 | Discharge: 2024-10-02 | Disposition: A | Source: Ambulatory Visit | Attending: Radiation Oncology

## 2024-10-02 ENCOUNTER — Telehealth: Payer: Self-pay | Admitting: Dietician

## 2024-10-02 ENCOUNTER — Inpatient Hospital Stay: Admitting: Dietician

## 2024-10-02 DIAGNOSIS — R1310 Dysphagia, unspecified: Secondary | ICD-10-CM

## 2024-10-02 DIAGNOSIS — R490 Dysphonia: Secondary | ICD-10-CM

## 2024-10-02 LAB — RAD ONC ARIA SESSION SUMMARY
Course Elapsed Days: 15
Plan Fractions Treated to Date: 10
Plan Prescribed Dose Per Fraction: 2.25 Gy
Plan Total Fractions Prescribed: 28
Plan Total Prescribed Dose: 63 Gy
Reference Point Dosage Given to Date: 22.5 Gy
Reference Point Session Dosage Given: 2.25 Gy
Session Number: 10

## 2024-10-02 NOTE — Telephone Encounter (Signed)
 Nutrition Follow-up:  Pt with larynx cancer. He is receiving radiation therapy under the care of Dr. Izell. Final RT planned 3/2   Spoke with patient via telephone. Patient doing well today. Appetite remains good and tolerating regular diet. Patient ate pork chops, creamed potatoes, beans for supper. He had a multiple appointments today and has eaten a sandwich. Patient continues working to increase daily water. Currently drinking 1.5 bottles. He also drinks lemonade and milk. He denies NIS at this time.  Medications: reviewed   Labs: no new labs  Anthropometrics: Wt 169.4 lb (2/3 aria)  1/27 - 164 lb    NUTRITION DIAGNOSIS: Food and nutrition related knowledge deficit improving    INTERVENTION:  Continue strategies for increasing calories and protein Continue working to increase daily water intake Suggested packing snacks to bring when he has multiple appointments   MONITORING, EVALUATION, GOAL: wt trends, intake   NEXT VISIT: Thursday February 12 before RT

## 2024-10-02 NOTE — Therapy (Signed)
 " OUTPATIENT SPEECH LANGUAGE PATHOLOGY ONCOLOGY EVALUATION   Patient Name: Daniel Cox MRN: [id number] DOB:[birth year], 79 y.o., male Today's Date: 10/02/2024  PCP: Loreli Kins, MD REFERRING PROVIDER: Izell Domino, MD  END OF SESSION:  End of Session - 10/02/24 1328     Visit Number 1    Number of Visits 4    Date for Recertification  12/31/24    SLP Start Time 1246    SLP Stop Time  1320    SLP Time Calculation (min) 34 min    Activity Tolerance Patient tolerated treatment well          Past Medical History:  Diagnosis Date   Chronic kidney disease    Coronary artery disease    Degenerative disc disease, cervical    Degenerative lumbar disc    Depression    Erectile dysfunction    History of kidney stones    Hyperlipidemia    Hypertension    Myocardial infarction [facility])    Sleep apnea    Stroke [facility])    Past Surgical History:  Procedure Laterality Date   Appendectony     COLONOSCOPY WITH PROPOFOL  N/A 08/16/2015   Procedure: COLONOSCOPY WITH PROPOFOL ;  Surgeon: Gladis MARLA Louder, MD;  Location: [facility] ENDOSCOPY;  Service: Endoscopy;  Laterality: N/A;   CORONARY/GRAFT ACUTE MI REVASCULARIZATION N/A 10/23/2020   Procedure: Coronary/Graft Acute MI Revascularization;  Surgeon: Court Dorn PARAS, MD;  Location: [facility] INVASIVE CV LAB;  Service: Cardiovascular;  Laterality: N/A;   KENALOG  INJECTION  07/22/2024   Procedure: INJECTION, TRIAMCINOLONE ;  Surgeon: Greggory Hadassah BROCKS, MD;  Location: [facility] OR;  Service: ENT;;   LEFT HEART CATH AND CORONARY ANGIOGRAPHY N/A 10/23/2020   Procedure: LEFT HEART CATH AND CORONARY ANGIOGRAPHY;  Surgeon: Court Dorn PARAS, MD;  Location: [facility] INVASIVE CV LAB;  Service: Cardiovascular;  Laterality: N/A;   LEFT HEART CATHETERIZATION WITH CORONARY ANGIOGRAM N/A 02/06/2013   Procedure: LEFT HEART CATHETERIZATION WITH CORONARY ANGIOGRAM;  Surgeon: Victory LELON Claudene DOUGLAS, MD;  Location: [facility] CATH LAB;  Service: Cardiovascular;  Laterality: N/A;    MICROLARYNGOSCOPY WITH LASER N/A 07/22/2024   Procedure: MICROLARYNGOSCOPY, WITH PROCEDURE USING  CO2 LASER;  Surgeon: Greggory Hadassah BROCKS, MD;  Location: [facility] OR;  Service: ENT;  Laterality: N/A;   PERCUTANEOUS CORONARY STENT INTERVENTION (PCI-S) N/A 02/10/2013   Procedure: PERCUTANEOUS CORONARY STENT INTERVENTION (PCI-S);  Surgeon: Victory LELON Claudene DOUGLAS, MD;  Location: [facility] CATH LAB;  Service: Cardiovascular;  Laterality: N/A;   Right shoulder     Patient Active Problem List   Diagnosis Date Noted   Malignant neoplasm of glottis (HCC) 08/17/2024   NSTEMI (non-ST elevated myocardial infarction) (HCC)    Acute ST elevation myocardial infarction (STEMI) due to occlusion of circumflex coronary artery (HCC) 10/23/2020   Left ventricular hypertrophy 02/12/2015   Coronary atherosclerosis of native coronary artery 06/04/2013   Essential hypertension, benign 06/04/2013   Cerebral embolism 02/07/2013    Class: Acute   Acute coronary syndrome (HCC) 02/04/2013    Class: Acute   Benign prostatic hyperplasia 02/04/2013    Class: Chronic   Chronic kidney disease (CKD), stage III (moderate) (HCC) 02/04/2013   Hyperlipidemia with target LDL less than 70 02/04/2013   Erectile dysfunction 02/04/2013    Class: Chronic    ONSET DATE: SEE PERTINENT HISTORY BELOW; referral 09/15/2024  REFERRING DIAG:  C32.0 (ICD-10-CM) - Malignant neoplasm of glottis (HCC)    THERAPY DIAG:  Dysphagia, unspecified type - Plan: SLP plan of care cert/re-cert  Hoarseness -  Plan: SLP plan of care cert/re-cert  Rationale for Evaluation and Treatment: Rehabilitation  SUBJECTIVE:   SUBJECTIVE STATEMENT: Denies current s/sx oropharyngeal dysphagia. Eating all desired foods.   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 07/07/24 He presented to Dr. Masciello for evaluation of hoarseness for two months. Laryngoscopy revealed a left anterior vocal fold lesion concerning  for malignancy. No cervical lymphadenopathy was palpated. 07/22/24 He underwent a microlaryngoscopy for excision of the left vocal cord lesion under anesthesia. Pathology showing findings consistent with moderately differentiated invasive SCC. 08/15/24 Consult with Dr. Izell, radiation planned. 08/27/24 CT neck/chest.Treatment plan: He will receive 28 fractions of radiation to his Larynx which started on 09/17/24 and complete 10/28/24. Pretreatment procedures: None  PAIN:  Are you having pain? No  FALLS: Has patient fallen in last 6 months?  No  LIVING ENVIRONMENT: Lives with: lives with their spouse Lives in: House/apartment  PLOF:  Level of assistance: Independent with ADLs, Independent with IADLs Employment: Retired  PATIENT GOALS: Keep WNL swallow  OBJECTIVE:  Note: Objective measures were completed at Evaluation unless otherwise noted. DIAGNOSTIC FINDINGS: See pertinent history above  INSTRUMENTAL SWALLOW STUDY FINDINGS (MBSS) none in CH[facility] at this time   COGNITION: Overall cognitive status: Within functional limits for tasks assessed  LANGUAGE: Receptive and Expressive language appeared WNL.  ORAL MOTOR EXAMINATION: Overall status: WFL  MOTOR SPEECH: Overall motor speech: Appears intact Respiration: thoracic breathing Phonation: breathy and hoarse Resonance: WFL Articulation: Appears intact Intelligibility: Intelligible  SUBJECTIVE DYSPHAGIA REPORTS:  Date of onset: n/a - denies current  sx Reported symptoms: none  Current diet: regular and thin liquids  Co-morbid voice changes: Yes  FACTORS WHICH MAY INCREASE RISK OF ADVERSE EVENT IN PRESENCE OF ASPIRATION:  General health: well appearing  Risk factors: none evident ; undergoing rad for glottal cancer  CLINICAL SWALLOW ASSESSMENT:   Dentition: dentures (upper) and lower natural teeth Vocal quality at baseline: hoarse and breathy Patient directly observed with POs: Yes: regular and thin liquids  Feeding: able  to feed self Liquids provided by: cup Oral phase signs and symptoms: None noted today Pharyngeal phase signs and symptoms: None noted today                                                                                                                             TREATMENT DATE:   10/02/24: Research states the risk for dysphagia increases due to radiation and/or chemotherapy treatment due to a variety of factors, so SLP educated the pt about the possibility of reduced/limited ability for PO intake during rad tx. SLP also educated pt regarding possible changes to swallowing musculature after rad tx, and why adherence to dysphagia HEP provided today and PO consumption was necessary to inhibit muscle fibrosis following rad tx and to mitigate muscle disuse atrophy. SLP informed pt why this would be detrimental to their swallowing status and to their  pulmonary health. Pt demonstrated understanding of these things to SLP. SLP encouraged pt to safely eat and drink as deep into their radiation/chemotherapy as possible to provide the best possible long-term swallowing outcome for pt.  SLP then developed an individualized HEP for pt involving oral and pharyngeal and vocal  strengthening and ROM and pt was instructed how to perform these exercises, including SLP demonstration. After SLP demonstration, pt return demonstrated each exercise. SLP ensured pt performance was correct prior to educating pt on next exercise. Pt required demo and occasional min cues faded to modified independent to perform HEP. Pt was instructed to complete this program at least 5 days a week, at 20-30 reps a day until 6 months after his or her last day of rad tx, and then x2 a week after that, indefinitely.   PATIENT EDUCATION: Education details: late effects head/neck radiation on swallow function, HEP procedure, and modification to HEP when difficulty experienced with swallowing during and after radiation course Person educated:  Patient Education method: Explanation, Demonstration, Verbal cues, and Handouts Education comprehension: verbalized understanding, returned demonstration, verbal cues required, and needs further education   ASSESSMENT:  CLINICAL IMPRESSION: Patient is a 79 y.o. M who was seen today for assessment of swallowing as they undergo radiation/chemoradiation therapy. Today pt ate turkey sandwich and drank thin liquids without overt s/s oral or pharyngeal difficulty. At this time pt swallowing is deemed WNL/WFL with these POs. There are no overt s/s aspiration PNA observed by SLP nor any reported by pt at this time. Data indicate that pt's swallow ability will likely decrease over the course of radiation/chemoradiation therapy and could very well decline over time following the conclusion of that therapy due to muscle disuse atrophy and/or muscle fibrosis. Pt will need to be seen regularly by SLP in order to assess safety of PO intake, assess the need for any objective swallow assessment, and ensuring pt is correctly completing the individualized HEP.  OBJECTIVE IMPAIRMENTS: include dysphagia. These impairments are limiting patient from safety when swallowing. Factors affecting potential to achieve goals and functional outcome are none noted today. Patient will benefit from skilled SLP services to address above impairments and improve overall function.   REHAB POTENTIAL: Good     GOALS: Goals reviewed with patient? No   SHORT TERM GOALS: Target: 3rd total session   1. Pt will complete HEP with modified independence in 2 sessions Baseline: Goal status: INITIAL   2.  pt will tell SLP why pt is completing HEP with modified independence Baseline:  Goal status: INITIAL   3.  pt will demo understanding of at least 3 overt s/s aspiration PNA with modified independence Baseline:  Goal status: INITIAL   4.  pt will demo knowledge of how a food journal could hasten return to a more normalized  diet Baseline:  Goal status: INITIAL     LONG TERM GOALS: Target: 7th total session   1.  pt will complete HEP with independence over two visits Baseline:  Goal status: INITIAL   2.  pt will describe how to modify HEP over time, and the timeline associated with reduction in HEP frequency with modified independence over two sessions Baseline:  Goal status: INITIAL     PLAN:   SLP FREQUENCY:  once approx every 4 weeks   SLP DURATION:  7 sessions   PLANNED INTERVENTIONS: Aspiration precaution training, Pharyngeal strengthening exercises, Diet toleration management , Trials of upgraded texture/liquids, SLP instruction and feedback, Compensatory strategies, and Patient/family education, [phone] (  treatment of swallowing dysfunction and/or oral function for feeding)   Janesha Brissette, CCC-SLP 10/02/2024, 1:31 PM      "

## 2024-10-03 ENCOUNTER — Ambulatory Visit: Admission: RE | Admit: 2024-10-03 | Source: Ambulatory Visit

## 2024-10-03 ENCOUNTER — Inpatient Hospital Stay

## 2024-10-03 ENCOUNTER — Other Ambulatory Visit: Payer: Self-pay

## 2024-10-03 LAB — RAD ONC ARIA SESSION SUMMARY
Course Elapsed Days: 16
Plan Fractions Treated to Date: 11
Plan Prescribed Dose Per Fraction: 2.25 Gy
Plan Total Fractions Prescribed: 28
Plan Total Prescribed Dose: 63 Gy
Reference Point Dosage Given to Date: 24.75 Gy
Reference Point Session Dosage Given: 2.25 Gy
Session Number: 11

## 2024-10-03 NOTE — Progress Notes (Signed)
 [facility] CSW Progress Note  Clinical Social Work Intern contacted patient by phone to follow-up on need for community resources and emotional support. Patient reports that treatment has been going well, and says he is doing well overall. Patient denies resource or support needs at this time.    Interventions: Reminded patient of the support team roles and support services at [facility] Provided CSW contact information and encouraged patient to call with any questions or concerns      Follow Up Plan:  Patient will contact CSW with any support or resource needs    Thersia KATHEE Daring Clinical Social Work Intern [facility]

## 2024-10-06 ENCOUNTER — Ambulatory Visit

## 2024-10-07 ENCOUNTER — Ambulatory Visit

## 2024-10-08 ENCOUNTER — Ambulatory Visit

## 2024-10-09 ENCOUNTER — Ambulatory Visit

## 2024-10-09 ENCOUNTER — Inpatient Hospital Stay: Admitting: Dietician

## 2024-10-10 ENCOUNTER — Ambulatory Visit

## 2024-10-13 ENCOUNTER — Ambulatory Visit

## 2024-10-14 ENCOUNTER — Ambulatory Visit

## 2024-10-15 ENCOUNTER — Ambulatory Visit

## 2024-10-16 ENCOUNTER — Inpatient Hospital Stay: Admitting: Dietician

## 2024-10-16 ENCOUNTER — Ambulatory Visit

## 2024-10-17 ENCOUNTER — Ambulatory Visit

## 2024-10-20 ENCOUNTER — Ambulatory Visit

## 2024-10-21 ENCOUNTER — Ambulatory Visit

## 2024-10-22 ENCOUNTER — Ambulatory Visit

## 2024-10-23 ENCOUNTER — Ambulatory Visit

## 2024-10-23 ENCOUNTER — Inpatient Hospital Stay: Admitting: Dietician

## 2024-10-24 ENCOUNTER — Ambulatory Visit

## 2024-10-27 ENCOUNTER — Ambulatory Visit

## 2024-10-28 ENCOUNTER — Ambulatory Visit

## 2024-11-05 ENCOUNTER — Ambulatory Visit

## 2025-02-04 ENCOUNTER — Ambulatory Visit (INDEPENDENT_AMBULATORY_CARE_PROVIDER_SITE_OTHER)
